# Patient Record
Sex: Female | Born: 1945
Health system: Southern US, Community
[De-identification: ages and names within clinical notes are randomized; demographics above are authoritative.]

## PROBLEM LIST (undated history)

## (undated) DIAGNOSIS — R002 Palpitations: Secondary | ICD-10-CM

## (undated) DIAGNOSIS — M1990 Unspecified osteoarthritis, unspecified site: Secondary | ICD-10-CM

## (undated) DIAGNOSIS — D649 Anemia, unspecified: Secondary | ICD-10-CM

## (undated) DIAGNOSIS — I82409 Acute embolism and thrombosis of unspecified deep veins of unspecified lower extremity: Secondary | ICD-10-CM

## (undated) DIAGNOSIS — I5181 Takotsubo syndrome: Secondary | ICD-10-CM

## (undated) DIAGNOSIS — Z87442 Personal history of urinary calculi: Secondary | ICD-10-CM

## (undated) DIAGNOSIS — I639 Cerebral infarction, unspecified: Secondary | ICD-10-CM

## (undated) DIAGNOSIS — T4145XA Adverse effect of unspecified anesthetic, initial encounter: Secondary | ICD-10-CM

## (undated) DIAGNOSIS — R570 Cardiogenic shock: Secondary | ICD-10-CM

## (undated) DIAGNOSIS — N182 Chronic kidney disease, stage 2 (mild): Secondary | ICD-10-CM

## (undated) DIAGNOSIS — I10 Essential (primary) hypertension: Secondary | ICD-10-CM

## (undated) DIAGNOSIS — M797 Fibromyalgia: Secondary | ICD-10-CM

## (undated) DIAGNOSIS — C50919 Malignant neoplasm of unspecified site of unspecified female breast: Secondary | ICD-10-CM

## (undated) DIAGNOSIS — I2129 ST elevation (STEMI) myocardial infarction involving other sites: Secondary | ICD-10-CM

## (undated) DIAGNOSIS — R55 Syncope and collapse: Secondary | ICD-10-CM

## (undated) DIAGNOSIS — T8859XA Other complications of anesthesia, initial encounter: Secondary | ICD-10-CM

## (undated) DIAGNOSIS — E119 Type 2 diabetes mellitus without complications: Secondary | ICD-10-CM

## (undated) DIAGNOSIS — E785 Hyperlipidemia, unspecified: Secondary | ICD-10-CM

## (undated) DIAGNOSIS — M0520 Rheumatoid vasculitis with rheumatoid arthritis of unspecified site: Secondary | ICD-10-CM

## (undated) DIAGNOSIS — K7581 Nonalcoholic steatohepatitis (NASH): Secondary | ICD-10-CM

## (undated) HISTORY — DX: Rheumatoid vasculitis with rheumatoid arthritis of unspecified site: M05.20

## (undated) HISTORY — PX: CARDIAC CATHETERIZATION: SHX172

## (undated) HISTORY — PX: COLONOSCOPY W/ BIOPSIES: SHX1374

## (undated) HISTORY — PX: CHOLECYSTECTOMY: SHX55

## (undated) HISTORY — PX: PLANTAR'S WART EXCISION: SHX2240

## (undated) HISTORY — PX: TRANSTHORACIC ECHOCARDIOGRAM: SHX275

## (undated) HISTORY — PX: BACK SURGERY: SHX140

## (undated) HISTORY — DX: Malignant neoplasm of unspecified site of unspecified female breast: C50.919

## (undated) HISTORY — PX: ABDOMINAL HYSTERECTOMY: SHX81

## (undated) HISTORY — PX: LARYNGOSCOPY: SUR817

---
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
## 1977-03-04 HISTORY — PX: TUBAL LIGATION: SHX77

## 1991-03-05 DIAGNOSIS — I82409 Acute embolism and thrombosis of unspecified deep veins of unspecified lower extremity: Secondary | ICD-10-CM

## 1991-03-05 HISTORY — DX: Acute embolism and thrombosis of unspecified deep veins of unspecified lower extremity: I82.409

## 1998-10-19 ENCOUNTER — Encounter: Payer: Self-pay | Admitting: Cardiology

## 1998-10-19 ENCOUNTER — Ambulatory Visit (HOSPITAL_COMMUNITY): Admission: AD | Admit: 1998-10-19 | Discharge: 1998-10-19 | Payer: Self-pay | Admitting: Cardiology

## 1999-07-02 ENCOUNTER — Other Ambulatory Visit: Admission: RE | Admit: 1999-07-02 | Discharge: 1999-07-02 | Payer: Self-pay | Admitting: Gynecology

## 2000-12-18 ENCOUNTER — Encounter (INDEPENDENT_AMBULATORY_CARE_PROVIDER_SITE_OTHER): Payer: Self-pay

## 2000-12-18 ENCOUNTER — Ambulatory Visit (HOSPITAL_COMMUNITY): Admission: RE | Admit: 2000-12-18 | Discharge: 2000-12-18 | Payer: Self-pay | Admitting: Gynecology

## 2001-08-18 ENCOUNTER — Emergency Department (HOSPITAL_COMMUNITY): Admission: EM | Admit: 2001-08-18 | Discharge: 2001-08-18 | Payer: Self-pay | Admitting: *Deleted

## 2001-08-18 ENCOUNTER — Encounter: Payer: Self-pay | Admitting: *Deleted

## 2002-10-18 ENCOUNTER — Other Ambulatory Visit: Admission: RE | Admit: 2002-10-18 | Discharge: 2002-10-18 | Payer: Self-pay | Admitting: Gynecology

## 2008-09-12 ENCOUNTER — Emergency Department (HOSPITAL_COMMUNITY): Admission: EM | Admit: 2008-09-12 | Discharge: 2008-09-13 | Payer: Self-pay | Admitting: Emergency Medicine

## 2009-03-04 DIAGNOSIS — J3800 Paralysis of vocal cords and larynx, unspecified: Secondary | ICD-10-CM

## 2009-03-04 HISTORY — DX: Paralysis of vocal cords and larynx, unspecified: J38.00

## 2009-04-28 ENCOUNTER — Encounter: Admission: RE | Admit: 2009-04-28 | Discharge: 2009-04-28 | Payer: Self-pay | Admitting: Sports Medicine

## 2009-08-07 HISTORY — PX: PARATHYROIDECTOMY: SHX19

## 2010-03-24 ENCOUNTER — Encounter: Payer: Self-pay | Admitting: Sports Medicine

## 2010-03-25 ENCOUNTER — Encounter: Payer: Self-pay | Admitting: Sports Medicine

## 2010-06-10 LAB — CBC
HCT: 39.1 % (ref 36.0–46.0)
Hemoglobin: 13.8 g/dL (ref 12.0–15.0)
MCV: 96.4 fL (ref 78.0–100.0)
RDW: 13.4 % (ref 11.5–15.5)

## 2010-06-10 LAB — DIFFERENTIAL
Basophils Absolute: 0.1 10*3/uL (ref 0.0–0.1)
Eosinophils Absolute: 0.4 10*3/uL (ref 0.0–0.7)
Eosinophils Relative: 4 % (ref 0–5)
Lymphocytes Relative: 37 % (ref 12–46)
Lymphs Abs: 3.6 10*3/uL (ref 0.7–4.0)
Monocytes Absolute: 0.8 10*3/uL (ref 0.1–1.0)

## 2010-06-10 LAB — POCT I-STAT, CHEM 8
BUN: 19 mg/dL (ref 6–23)
Calcium, Ion: 1.19 mmol/L (ref 1.12–1.32)
Creatinine, Ser: 0.7 mg/dL (ref 0.4–1.2)
TCO2: 19 mmol/L (ref 0–100)

## 2010-06-10 LAB — URINALYSIS, ROUTINE W REFLEX MICROSCOPIC
Bilirubin Urine: NEGATIVE
Hgb urine dipstick: NEGATIVE
Ketones, ur: NEGATIVE mg/dL
Specific Gravity, Urine: 1.012 (ref 1.005–1.030)
Urobilinogen, UA: 0.2 mg/dL (ref 0.0–1.0)

## 2010-06-10 LAB — URINE MICROSCOPIC-ADD ON

## 2010-06-10 LAB — GLUCOSE, CAPILLARY: Glucose-Capillary: 221 mg/dL — ABNORMAL HIGH (ref 70–99)

## 2010-07-20 NOTE — Op Note (Signed)
[facility]  Patient:    Sarah Flores, Sarah Flores Visit Number: [id number] MRN: [id number]          Service Type: DSU Location: [facility] Attending Physician:  Susa Raring Dictated by:   Luvenia Redden, M.D. Proc. Date: 12/18/00 Admit Date:  12/18/2000                             Operative Report  PREOPERATIVE DIAGNOSES:       1. Pigmented lesion of the right vulva unknown                                  etiology.                               2. 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  POSTOPERATIVE DIAGNOSES:      1. Pigmented lesion of the right vulva unknown                                  etiology.                               2. 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  OPERATION:                    Excisional biopsies.  PROCEDURE:                    Patient was on the operating table in the modified lithotomy position under sedation.  The questionable areas were infiltrated with 1% lidocaine for anesthesia.  Each lesion was then excised and the bed cauterized with electrocoagulation.  The lesions were sent to pathology for examination.  There were two other small hemangiomas on the vulva that were electrodesiccated.  Neosporin ointment was applied to the operative sites.  Blood loss was nil.  Patient was removed to recovery in good condition. Dictated by:   Luvenia Redden, M.D. Attending Physician:  Susa Raring DD:  12/18/00 TD:  12/19/00 Job: [id number]

## 2013-01-19 ENCOUNTER — Other Ambulatory Visit: Payer: Self-pay | Admitting: Neurological Surgery

## 2013-02-05 ENCOUNTER — Encounter (HOSPITAL_COMMUNITY): Payer: Self-pay | Admitting: Pharmacy Technician

## 2013-02-09 NOTE — Pre-Procedure Instructions (Signed)
ALESA ECHEVARRIA  02/09/2013   Your procedure is scheduled on:  Thurs, Dec 18 @ 2:00 PM  Report to [facility] Short Stay Entrance A  at 12:00 PM.  Call this number if you have problems the morning of surgery: [phone]   Remember:   Do not eat food or drink liquids after midnight.   Take these medicines the morning of surgery with A SIP OF WATER: Cymbalta(Duloxetine),Pain Pill(if needed),and Metoprolol(Lopressor)              No BC's,Goody's,Aleve,Aspirin,Ibuprofen,Fish Oil,or any Herbal Medications    Do not wear jewelry, make-up or nail polish.  Do not wear lotions, powders, or perfumes. You may wear deodorant.  Do not shave 48 hours prior to surgery.   Do not bring valuables to the hospital.  [facility] is not responsible                  for any belongings or valuables.               Contacts, dentures or bridgework may not be worn into surgery.  Leave suitcase in the car. After surgery it may be brought to your room.  For patients admitted to the hospital, discharge time is determined by your                treatment team.               Special Instructions: Shower using CHG 2 nights before surgery and the night before surgery.  If you shower the day of surgery use CHG.  Use special wash - you have one bottle of CHG for all showers.  You should use approximately 1/3 of the bottle for each shower.   Please read over the following fact sheets that you were given: Pain Booklet, Coughing and Deep Breathing, Blood Transfusion Information, MRSA Information and Surgical Site Infection Prevention

## 2013-02-10 ENCOUNTER — Ambulatory Visit (HOSPITAL_COMMUNITY)
Admission: RE | Admit: 2013-02-10 | Discharge: 2013-02-10 | Disposition: A | Discharge status: Home / Self Care | Payer: Medicare Other | Source: Ambulatory Visit | Attending: Neurological Surgery | Admitting: Neurological Surgery

## 2013-02-10 ENCOUNTER — Encounter (HOSPITAL_COMMUNITY): Payer: Self-pay

## 2013-02-10 ENCOUNTER — Encounter (HOSPITAL_COMMUNITY)
Admission: RE | Admit: 2013-02-10 | Discharge: 2013-02-10 | Disposition: A | Discharge status: Home / Self Care | Payer: Medicare Other | Source: Ambulatory Visit | Attending: Neurological Surgery | Admitting: Neurological Surgery

## 2013-02-10 DIAGNOSIS — Z01818 Encounter for other preprocedural examination: Secondary | ICD-10-CM | POA: Insufficient documentation

## 2013-02-10 DIAGNOSIS — Z01812 Encounter for preprocedural laboratory examination: Secondary | ICD-10-CM | POA: Insufficient documentation

## 2013-02-10 DIAGNOSIS — Z01810 Encounter for preprocedural cardiovascular examination: Secondary | ICD-10-CM | POA: Insufficient documentation

## 2013-02-10 DIAGNOSIS — B958 Unspecified staphylococcus as the cause of diseases classified elsewhere: Secondary | ICD-10-CM | POA: Insufficient documentation

## 2013-02-10 HISTORY — DX: Fibromyalgia: M79.7

## 2013-02-10 HISTORY — DX: Essential (primary) hypertension: I10

## 2013-02-10 HISTORY — DX: Adverse effect of unspecified anesthetic, initial encounter: T41.45XA

## 2013-02-10 HISTORY — DX: Other complications of anesthesia, initial encounter: T88.59XA

## 2013-02-10 HISTORY — DX: Unspecified osteoarthritis, unspecified site: M19.90

## 2013-02-10 LAB — BASIC METABOLIC PANEL
BUN: 9 mg/dL (ref 6–23)
Calcium: 8.4 mg/dL (ref 8.4–10.5)
Chloride: 95 mEq/L — ABNORMAL LOW (ref 96–112)
Creatinine, Ser: 0.87 mg/dL (ref 0.50–1.10)
GFR calc Af Amer: 79 mL/min — ABNORMAL LOW (ref >90)
GFR calc non Af Amer: 68 mL/min — ABNORMAL LOW (ref >90)
Glucose, Bld: 180 mg/dL — ABNORMAL HIGH (ref 70–99)

## 2013-02-10 LAB — CBC WITH DIFFERENTIAL/PLATELET
Basophils Absolute: 0.1 10*3/uL (ref 0.0–0.1)
Basophils Relative: 1 % (ref 0–1)
Lymphocytes Relative: 31 % (ref 12–46)
MCHC: 34.8 g/dL (ref 30.0–36.0)
Neutro Abs: 6.6 10*3/uL (ref 1.7–7.7)
Platelets: 397 10*3/uL (ref 150–400)
RDW: 13.2 % (ref 11.5–15.5)
WBC: 11.1 10*3/uL — ABNORMAL HIGH (ref 4.0–10.5)

## 2013-02-10 LAB — PROTIME-INR
INR: 1.01 (ref 0.00–1.49)
Prothrombin Time: 13.1 seconds (ref 11.6–15.2)

## 2013-02-10 LAB — TYPE AND SCREEN
ABO/RH(D): O NEG
Antibody Screen: NEGATIVE

## 2013-02-10 NOTE — Progress Notes (Signed)
Erie Noe at Dr Barnett Applebaum office made aware that patient's nasal swab was positive for staph.

## 2013-02-11 ENCOUNTER — Encounter (HOSPITAL_COMMUNITY): Payer: Self-pay

## 2013-02-11 NOTE — Progress Notes (Signed)
Anesthesia Chart Review:  Patient is a 67 year old female scheduled for L5-S1 MAS, PLIF on 02/18/13 by Dr. Yetta Barre.  History includes non-smoker, HTN, DM2, CKD ? (elevated labs in the past), fibromyalgia, arthritis, hysterectomy, cholecystectomy, tonsillectomy, appendectomy, hyperparathyroidism s/p parathyroidectomy '11. PCP is listed as Dr. Feliciana Rossetti.  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  Anesthesia records obtained from that date.  Notes indicate that she was intubated with a 7.0 ETT under direct vision with atraumatic laryngoscopy (laryngoscope blade MAC 3) X 1 with grade 1 view.  However, on 09/29/09, she did undergo laryngoscopy micro suspension with left Cymetra injection for dysphonia.  EKG on 02/10/13 showed NSR.  Preoperative CXR and labs noted.    Anticipate plans for GA with ETT for this procedure.  Further evaluation for specific airway equipment needs by her assigned anesthesiologist on the day of surgery.  Sarah Flores [facility] Short Stay Center/Anesthesiology Phone [phone] 02/11/2013 11:19 AM

## 2013-02-18 ENCOUNTER — Inpatient Hospital Stay (HOSPITAL_COMMUNITY): Payer: Medicare Other | Admitting: Anesthesiology

## 2013-02-18 ENCOUNTER — Inpatient Hospital Stay (HOSPITAL_COMMUNITY): Payer: Medicare Other

## 2013-02-18 ENCOUNTER — Inpatient Hospital Stay (HOSPITAL_COMMUNITY)
Admission: RE | Admit: 2013-02-18 | Discharge: 2013-02-28 | DRG: 459 | Disposition: A | Discharge status: Home / Self Care | Payer: Medicare Other | Source: Ambulatory Visit | Attending: Neurological Surgery | Admitting: Neurological Surgery

## 2013-02-18 ENCOUNTER — Encounter (HOSPITAL_COMMUNITY): Payer: Self-pay | Admitting: *Deleted

## 2013-02-18 ENCOUNTER — Encounter (HOSPITAL_COMMUNITY): Payer: Medicare Other | Admitting: Vascular Surgery

## 2013-02-18 ENCOUNTER — Encounter (HOSPITAL_COMMUNITY)
Admission: RE | Disposition: A | Discharge status: Home / Self Care | Payer: Self-pay | Source: Ambulatory Visit | Attending: Neurological Surgery

## 2013-02-18 DIAGNOSIS — E876 Hypokalemia: Secondary | ICD-10-CM | POA: Diagnosis not present

## 2013-02-18 DIAGNOSIS — M129 Arthropathy, unspecified: Secondary | ICD-10-CM | POA: Diagnosis present

## 2013-02-18 DIAGNOSIS — E1165 Type 2 diabetes mellitus with hyperglycemia: Secondary | ICD-10-CM | POA: Diagnosis present

## 2013-02-18 DIAGNOSIS — I129 Hypertensive chronic kidney disease with stage 1 through stage 4 chronic kidney disease, or unspecified chronic kidney disease: Secondary | ICD-10-CM | POA: Diagnosis present

## 2013-02-18 DIAGNOSIS — IMO0002 Reserved for concepts with insufficient information to code with codable children: Secondary | ICD-10-CM | POA: Diagnosis present

## 2013-02-18 DIAGNOSIS — I10 Essential (primary) hypertension: Secondary | ICD-10-CM | POA: Diagnosis present

## 2013-02-18 DIAGNOSIS — Z981 Arthrodesis status: Secondary | ICD-10-CM

## 2013-02-18 DIAGNOSIS — R824 Acetonuria: Secondary | ICD-10-CM | POA: Diagnosis not present

## 2013-02-18 DIAGNOSIS — I5181 Takotsubo syndrome: Secondary | ICD-10-CM | POA: Diagnosis not present

## 2013-02-18 DIAGNOSIS — E119 Type 2 diabetes mellitus without complications: Secondary | ICD-10-CM

## 2013-02-18 DIAGNOSIS — R81 Glycosuria: Secondary | ICD-10-CM | POA: Diagnosis not present

## 2013-02-18 DIAGNOSIS — Z8249 Family history of ischemic heart disease and other diseases of the circulatory system: Secondary | ICD-10-CM

## 2013-02-18 DIAGNOSIS — I2129 ST elevation (STEMI) myocardial infarction involving other sites: Secondary | ICD-10-CM | POA: Diagnosis not present

## 2013-02-18 DIAGNOSIS — I952 Hypotension due to drugs: Secondary | ICD-10-CM | POA: Diagnosis not present

## 2013-02-18 DIAGNOSIS — R570 Cardiogenic shock: Secondary | ICD-10-CM | POA: Diagnosis not present

## 2013-02-18 DIAGNOSIS — M5126 Other intervertebral disc displacement, lumbar region: Principal | ICD-10-CM | POA: Diagnosis present

## 2013-02-18 DIAGNOSIS — I429 Cardiomyopathy, unspecified: Secondary | ICD-10-CM | POA: Diagnosis not present

## 2013-02-18 DIAGNOSIS — IMO0001 Reserved for inherently not codable concepts without codable children: Secondary | ICD-10-CM | POA: Diagnosis present

## 2013-02-18 DIAGNOSIS — N189 Chronic kidney disease, unspecified: Secondary | ICD-10-CM | POA: Diagnosis present

## 2013-02-18 DIAGNOSIS — E785 Hyperlipidemia, unspecified: Secondary | ICD-10-CM | POA: Diagnosis present

## 2013-02-18 HISTORY — DX: Acute embolism and thrombosis of unspecified deep veins of unspecified lower extremity: I82.409

## 2013-02-18 HISTORY — DX: Hyperlipidemia, unspecified: E78.5

## 2013-02-18 HISTORY — DX: ST elevation (STEMI) myocardial infarction involving other sites: I21.29

## 2013-02-18 HISTORY — PX: TRANSTHORACIC ECHOCARDIOGRAM: SHX275

## 2013-02-18 HISTORY — PX: MAXIMUM ACCESS (MAS)POSTERIOR LUMBAR INTERBODY FUSION (PLIF) 1 LEVEL: SHX6368

## 2013-02-18 LAB — GLUCOSE, CAPILLARY: Glucose-Capillary: 112 mg/dL — ABNORMAL HIGH (ref 70–99)

## 2013-02-18 SURGERY — FOR MAXIMUM ACCESS (MAS) POSTERIOR LUMBAR INTERBODY FUSION (PLIF) 1 LEVEL
Anesthesia: General | Site: Back

## 2013-02-18 MED ORDER — OXYCODONE-ACETAMINOPHEN 5-325 MG PO TABS
1.0000 | ORAL_TABLET | ORAL | Status: DC | PRN
  Administered 2013-02-18 – 2013-02-19 (×2): 2 via ORAL
  Administered 2013-02-20: 1 via ORAL
  Administered 2013-02-22 – 2013-02-23 (×2): 2 via ORAL
  Administered 2013-02-25: 1 via ORAL
  Administered 2013-02-25 (×2): 2 via ORAL
  Administered 2013-02-26: 1 via ORAL
  Administered 2013-02-27: 2 via ORAL
  Filled 2013-02-18 (×2): qty 2
  Filled 2013-02-18: qty 1
  Filled 2013-02-18 (×2): qty 2
  Filled 2013-02-18: qty 1
  Filled 2013-02-18: qty 2
  Filled 2013-02-18: qty 1
  Filled 2013-02-18 (×2): qty 2
  Filled 2013-02-18: qty 1
  Filled 2013-02-18 (×2): qty 2

## 2013-02-18 MED ORDER — SODIUM CHLORIDE 0.9 % IJ SOLN: 3.0000 mL | INTRAMUSCULAR | Status: DC | PRN

## 2013-02-18 MED ORDER — MORPHINE SULFATE 2 MG/ML IJ SOLN
1.0000 mg | INTRAMUSCULAR | Status: DC | PRN
  Administered 2013-02-18 – 2013-02-22 (×5): 2 mg via INTRAVENOUS
  Filled 2013-02-18 (×5): qty 1

## 2013-02-18 MED ORDER — HYDROMORPHONE HCL PF 1 MG/ML IJ SOLN
0.2500 mg | INTRAMUSCULAR | Status: DC | PRN
  Administered 2013-02-18 (×2): 0.5 mg via INTRAVENOUS

## 2013-02-18 MED ORDER — LINAGLIPTIN 5 MG PO TABS
5.0000 mg | ORAL_TABLET | Freq: Every day | ORAL | Status: DC
  Administered 2013-02-18 – 2013-02-28 (×10): 5 mg via ORAL
  Filled 2013-02-18 (×11): qty 1

## 2013-02-18 MED ORDER — TRIAMTERENE-HCTZ 37.5-25 MG PO TABS
1.0000 | ORAL_TABLET | Freq: Every day | ORAL | Status: DC
  Administered 2013-02-18 – 2013-02-19 (×2): 1 via ORAL
  Filled 2013-02-18 (×2): qty 1

## 2013-02-18 MED ORDER — LIDOCAINE HCL (CARDIAC) 20 MG/ML IV SOLN
INTRAVENOUS | Status: DC | PRN
  Administered 2013-02-18: 80 mg via INTRAVENOUS

## 2013-02-18 MED ORDER — DEXAMETHASONE SODIUM PHOSPHATE 10 MG/ML IJ SOLN
INTRAMUSCULAR | Status: AC
  Filled 2013-02-18: qty 1

## 2013-02-18 MED ORDER — SODIUM CHLORIDE 0.9 % IV SOLN: 250.0000 mL | INTRAVENOUS | Status: DC

## 2013-02-18 MED ORDER — THROMBIN 5000 UNITS EX SOLR
OROMUCOSAL | Status: DC | PRN
  Administered 2013-02-18: 14:00:00 via TOPICAL

## 2013-02-18 MED ORDER — VANCOMYCIN HCL 1000 MG IV SOLR
1000.0000 mg | Freq: Two times a day (BID) | INTRAVENOUS | Status: DC
  Administered 2013-02-18: 1000 mg via INTRAVENOUS

## 2013-02-18 MED ORDER — MENTHOL 3 MG MT LOZG: 1.0000 | LOZENGE | OROMUCOSAL | Status: DC | PRN

## 2013-02-18 MED ORDER — MIDAZOLAM HCL 5 MG/5ML IJ SOLN
INTRAMUSCULAR | Status: DC | PRN
  Administered 2013-02-18 (×2): 1 mg via INTRAVENOUS

## 2013-02-18 MED ORDER — SODIUM CHLORIDE 0.9 % IR SOLN
Status: DC | PRN
  Administered 2013-02-18: 14:00:00

## 2013-02-18 MED ORDER — ACETAMINOPHEN 325 MG PO TABS: 650.0000 mg | ORAL_TABLET | ORAL | Status: DC | PRN

## 2013-02-18 MED ORDER — CELECOXIB 200 MG PO CAPS
200.0000 mg | ORAL_CAPSULE | Freq: Two times a day (BID) | ORAL | Status: DC
  Administered 2013-02-19: 200 mg via ORAL
  Filled 2013-02-18 (×7): qty 1

## 2013-02-18 MED ORDER — 0.9 % SODIUM CHLORIDE (POUR BTL) OPTIME
TOPICAL | Status: DC | PRN
  Administered 2013-02-18: 1000 mL

## 2013-02-18 MED ORDER — PROPOFOL 10 MG/ML IV BOLUS
INTRAVENOUS | Status: DC | PRN
  Administered 2013-02-18: 160 mg via INTRAVENOUS

## 2013-02-18 MED ORDER — LOSARTAN POTASSIUM 50 MG PO TABS
100.0000 mg | ORAL_TABLET | Freq: Every day | ORAL | Status: DC
  Administered 2013-02-19: 100 mg via ORAL
  Filled 2013-02-18 (×2): qty 2

## 2013-02-18 MED ORDER — DEXAMETHASONE SODIUM PHOSPHATE 10 MG/ML IJ SOLN: 10.0000 mg | INTRAMUSCULAR | Status: DC

## 2013-02-18 MED ORDER — DEXAMETHASONE 4 MG PO TABS
4.0000 mg | ORAL_TABLET | Freq: Four times a day (QID) | ORAL | Status: DC
  Administered 2013-02-18: 4 mg via ORAL
  Filled 2013-02-18 (×7): qty 1

## 2013-02-18 MED ORDER — ACETAMINOPHEN 650 MG RE SUPP: 650.0000 mg | RECTAL | Status: DC | PRN

## 2013-02-18 MED ORDER — ALBUMIN HUMAN 5 % IV SOLN
INTRAVENOUS | Status: DC | PRN
  Administered 2013-02-18: 15:00:00 via INTRAVENOUS

## 2013-02-18 MED ORDER — GLIPIZIDE ER 10 MG PO TB24
10.0000 mg | ORAL_TABLET | Freq: Two times a day (BID) | ORAL | Status: DC
  Administered 2013-02-19 – 2013-02-28 (×18): 10 mg via ORAL
  Filled 2013-02-18 (×21): qty 1

## 2013-02-18 MED ORDER — DEXAMETHASONE SODIUM PHOSPHATE 4 MG/ML IJ SOLN
4.0000 mg | Freq: Four times a day (QID) | INTRAMUSCULAR | Status: DC
  Administered 2013-02-19 (×2): 4 mg via INTRAVENOUS
  Filled 2013-02-18 (×5): qty 1

## 2013-02-18 MED ORDER — POTASSIUM CHLORIDE IN NACL 20-0.9 MEQ/L-% IV SOLN
INTRAVENOUS | Status: DC
  Administered 2013-02-18: 19:00:00 via INTRAVENOUS
  Filled 2013-02-18 (×3): qty 1000

## 2013-02-18 MED ORDER — PHENOL 1.4 % MT LIQD: 1.0000 | OROMUCOSAL | Status: DC | PRN

## 2013-02-18 MED ORDER — SUCCINYLCHOLINE CHLORIDE 20 MG/ML IJ SOLN
INTRAMUSCULAR | Status: DC | PRN
  Administered 2013-02-18: 100 mg via INTRAVENOUS

## 2013-02-18 MED ORDER — LACTATED RINGERS IV SOLN
INTRAVENOUS | Status: DC | PRN
  Administered 2013-02-18 (×2): via INTRAVENOUS

## 2013-02-18 MED ORDER — THROMBIN 20000 UNITS EX SOLR
CUTANEOUS | Status: DC | PRN
  Administered 2013-02-18: 14:00:00 via TOPICAL

## 2013-02-18 MED ORDER — METOPROLOL TARTRATE 50 MG PO TABS
50.0000 mg | ORAL_TABLET | Freq: Every day | ORAL | Status: DC
  Administered 2013-02-18 – 2013-02-19 (×2): 50 mg via ORAL
  Filled 2013-02-18 (×2): qty 1

## 2013-02-18 MED ORDER — VANCOMYCIN HCL IN DEXTROSE 1-5 GM/200ML-% IV SOLN
1000.0000 mg | Freq: Once | INTRAVENOUS | Status: AC
  Administered 2013-02-19: 1000 mg via INTRAVENOUS
  Filled 2013-02-18: qty 200

## 2013-02-18 MED ORDER — BUPIVACAINE HCL (PF) 0.25 % IJ SOLN
INTRAMUSCULAR | Status: DC | PRN
  Administered 2013-02-18: 3 mL

## 2013-02-18 MED ORDER — DULOXETINE HCL 60 MG PO CPEP
60.0000 mg | ORAL_CAPSULE | Freq: Every day | ORAL | Status: DC
  Administered 2013-02-19 – 2013-02-28 (×10): 60 mg via ORAL
  Filled 2013-02-18 (×11): qty 1

## 2013-02-18 MED ORDER — ONDANSETRON HCL 4 MG/2ML IJ SOLN
INTRAMUSCULAR | Status: DC | PRN
  Administered 2013-02-18: 4 mg via INTRAVENOUS

## 2013-02-18 MED ORDER — GLIPIZIDE ER 10 MG PO TB24
10.0000 mg | ORAL_TABLET | Freq: Two times a day (BID) | ORAL | Status: DC
  Filled 2013-02-18: qty 1

## 2013-02-18 MED ORDER — ONDANSETRON HCL 4 MG/2ML IJ SOLN
4.0000 mg | INTRAMUSCULAR | Status: DC | PRN
  Administered 2013-02-19 – 2013-02-23 (×3): 4 mg via INTRAVENOUS
  Filled 2013-02-18 (×3): qty 2

## 2013-02-18 MED ORDER — HYDROMORPHONE HCL PF 1 MG/ML IJ SOLN
INTRAMUSCULAR | Status: AC
  Filled 2013-02-18: qty 1

## 2013-02-18 MED ORDER — OXYCODONE HCL 5 MG/5ML PO SOLN: 5.0000 mg | Freq: Once | ORAL | Status: DC | PRN

## 2013-02-18 MED ORDER — EPHEDRINE SULFATE 50 MG/ML IJ SOLN
INTRAMUSCULAR | Status: DC | PRN
  Administered 2013-02-18: 5 mg via INTRAVENOUS
  Administered 2013-02-18: 10 mg via INTRAVENOUS
  Administered 2013-02-18 (×2): 5 mg via INTRAVENOUS

## 2013-02-18 MED ORDER — METFORMIN HCL 500 MG PO TABS
1000.0000 mg | ORAL_TABLET | Freq: Two times a day (BID) | ORAL | Status: DC
  Administered 2013-02-19: 1000 mg via ORAL
  Filled 2013-02-18 (×3): qty 2

## 2013-02-18 MED ORDER — DEXAMETHASONE SODIUM PHOSPHATE 4 MG/ML IJ SOLN
INTRAMUSCULAR | Status: DC | PRN
  Administered 2013-02-18: 4 mg via INTRAVENOUS

## 2013-02-18 MED ORDER — METHOCARBAMOL 100 MG/ML IJ SOLN
500.0000 mg | Freq: Four times a day (QID) | INTRAVENOUS | Status: DC | PRN
  Administered 2013-02-19: 500 mg via INTRAVENOUS
  Filled 2013-02-18: qty 5

## 2013-02-18 MED ORDER — SODIUM CHLORIDE 0.9 % IJ SOLN
3.0000 mL | Freq: Two times a day (BID) | INTRAMUSCULAR | Status: DC
  Administered 2013-02-18 – 2013-02-19 (×2): 3 mL via INTRAVENOUS

## 2013-02-18 MED ORDER — ARTIFICIAL TEARS OP OINT
TOPICAL_OINTMENT | OPHTHALMIC | Status: DC | PRN
  Administered 2013-02-18: 1 via OPHTHALMIC

## 2013-02-18 MED ORDER — METHOCARBAMOL 500 MG PO TABS
500.0000 mg | ORAL_TABLET | Freq: Four times a day (QID) | ORAL | Status: DC | PRN
  Administered 2013-02-18 – 2013-02-21 (×7): 500 mg via ORAL
  Filled 2013-02-18 (×7): qty 1

## 2013-02-18 MED ORDER — VANCOMYCIN HCL IN DEXTROSE 1-5 GM/200ML-% IV SOLN
INTRAVENOUS | Status: AC
  Filled 2013-02-18: qty 200

## 2013-02-18 MED ORDER — FENTANYL CITRATE 0.05 MG/ML IJ SOLN
INTRAMUSCULAR | Status: DC | PRN
  Administered 2013-02-18 (×3): 50 ug via INTRAVENOUS

## 2013-02-18 MED ORDER — LACTATED RINGERS IV SOLN
INTRAVENOUS | Status: DC
  Administered 2013-02-18: 12:00:00 via INTRAVENOUS

## 2013-02-18 MED ORDER — ONDANSETRON HCL 4 MG/2ML IJ SOLN: 4.0000 mg | Freq: Four times a day (QID) | INTRAMUSCULAR | Status: DC | PRN

## 2013-02-18 MED ORDER — OXYCODONE HCL 5 MG PO TABS: 5.0000 mg | ORAL_TABLET | Freq: Once | ORAL | Status: DC | PRN

## 2013-02-18 SURGICAL SUPPLY — 61 items
APL SKNCLS STERI-STRIP NONHPOA (GAUZE/BANDAGES/DRESSINGS) ×1
BAG DECANTER FOR FLEXI CONT (MISCELLANEOUS) ×2 IMPLANT
BENZOIN TINCTURE PRP APPL 2/3 (GAUZE/BANDAGES/DRESSINGS) ×2 IMPLANT
BLADE SURG ROTATE 9660 (MISCELLANEOUS) IMPLANT
BONE MATRIX OSTEOCEL PRO MED (Bone Implant) ×2 IMPLANT
BUR MATCHSTICK NEURO 3.0 LAGG (BURR) ×2 IMPLANT
CAGE MAS PLIF 9X9X23-8 LUMBAR (Cage) ×4 IMPLANT
CANISTER SUCT 3000ML (MISCELLANEOUS) ×2 IMPLANT
CLIP NEUROVISION LG (CLIP) ×2 IMPLANT
CONT SPEC 4OZ CLIKSEAL STRL BL (MISCELLANEOUS) ×4 IMPLANT
COVER BACK TABLE 24X17X13 BIG (DRAPES) IMPLANT
COVER TABLE BACK 60X90 (DRAPES) ×2 IMPLANT
DRAPE C-ARM 42X72 X-RAY (DRAPES) ×2 IMPLANT
DRAPE C-ARMOR (DRAPES) ×2 IMPLANT
DRAPE LAPAROTOMY 100X72X124 (DRAPES) ×2 IMPLANT
DRAPE POUCH INSTRU U-SHP 10X18 (DRAPES) ×2 IMPLANT
DRAPE SURG 17X23 STRL (DRAPES) ×2 IMPLANT
DRESSING TELFA 8X3 (GAUZE/BANDAGES/DRESSINGS) ×2 IMPLANT
DRSG OPSITE 4X5.5 SM (GAUZE/BANDAGES/DRESSINGS) ×2 IMPLANT
DRSG OPSITE POSTOP 4X6 (GAUZE/BANDAGES/DRESSINGS) ×2 IMPLANT
DURAPREP 26ML APPLICATOR (WOUND CARE) ×2 IMPLANT
ELECT REM PT RETURN 9FT ADLT (ELECTROSURGICAL) ×2
ELECTRODE REM PT RTRN 9FT ADLT (ELECTROSURGICAL) ×1 IMPLANT
EVACUATOR 1/8 PVC DRAIN (DRAIN) ×2 IMPLANT
GAUZE SPONGE 4X4 16PLY XRAY LF (GAUZE/BANDAGES/DRESSINGS) IMPLANT
GLOVE BIO SURGEON STRL SZ8 (GLOVE) ×4 IMPLANT
GLOVE BIOGEL PI IND STRL 7.5 (GLOVE) ×1 IMPLANT
GLOVE BIOGEL PI INDICATOR 7.5 (GLOVE) ×1
GLOVE ECLIPSE 7.5 STRL STRAW (GLOVE) ×1 IMPLANT
GLOVE INDICATOR 7.5 STRL GRN (GLOVE) ×2 IMPLANT
GLOVE SURG SS PI 7.0 STRL IVOR (GLOVE) ×2 IMPLANT
GOWN BRE IMP SLV AUR LG STRL (GOWN DISPOSABLE) ×2 IMPLANT
GOWN BRE IMP SLV AUR XL STRL (GOWN DISPOSABLE) ×2 IMPLANT
GOWN STRL REIN 2XL LVL4 (GOWN DISPOSABLE) IMPLANT
HEMOSTAT POWDER KIT SURGIFOAM (HEMOSTASIS) IMPLANT
KIT BASIN OR (CUSTOM PROCEDURE TRAY) ×2 IMPLANT
KIT ROOM TURNOVER OR (KITS) ×2 IMPLANT
MILL MEDIUM DISP (BLADE) ×2 IMPLANT
NEEDLE HYPO 25X1 1.5 SAFETY (NEEDLE) ×2 IMPLANT
NS IRRIG 1000ML POUR BTL (IV SOLUTION) ×2 IMPLANT
PACK LAMINECTOMY NEURO (CUSTOM PROCEDURE TRAY) ×2 IMPLANT
PAD ARMBOARD 7.5X6 YLW CONV (MISCELLANEOUS) ×6 IMPLANT
ROD 35MM (Rod) ×1 IMPLANT
SCREW 5.0X30 (Screw) ×4 IMPLANT
SCREW LOCK (Screw) ×8 IMPLANT
SCREW LOCK FXNS SPNE MAS PL (Screw) ×4 IMPLANT
SCREW SHANK 5.0X30MM (Screw) ×4 IMPLANT
SCREW TULIP 5.5 (Screw) ×4 IMPLANT
SPONGE LAP 4X18 X RAY DECT (DISPOSABLE) IMPLANT
SPONGE SURGIFOAM ABS GEL 100 (HEMOSTASIS) ×2 IMPLANT
STRIP CLOSURE SKIN 1/2X4 (GAUZE/BANDAGES/DRESSINGS) ×2 IMPLANT
SUT VIC AB 0 CT1 18XCR BRD8 (SUTURE) ×1 IMPLANT
SUT VIC AB 0 CT1 8-18 (SUTURE) ×2
SUT VIC AB 2-0 CP2 18 (SUTURE) ×2 IMPLANT
SUT VIC AB 3-0 SH 8-18 (SUTURE) ×4 IMPLANT
SYR 20ML ECCENTRIC (SYRINGE) ×2 IMPLANT
SYR 3ML LL SCALE MARK (SYRINGE) IMPLANT
TOWEL OR 17X24 6PK STRL BLUE (TOWEL DISPOSABLE) ×2 IMPLANT
TOWEL OR 17X26 10 PK STRL BLUE (TOWEL DISPOSABLE) ×2 IMPLANT
TRAY FOLEY CATH 14FRSI W/METER (CATHETERS) ×2 IMPLANT
WATER STERILE IRR 1000ML POUR (IV SOLUTION) ×2 IMPLANT

## 2013-02-18 NOTE — Transfer of Care (Signed)
Immediate Anesthesia Transfer of Care Note  Patient: Sarah Flores  Procedure(s) Performed: Procedure(s): LUMBAR FIVE TO SACRAL ONE MAXIMUM ACCESS (MAS) POSTERIOR LUMBAR INTERBODY FUSION (PLIF) 1 LEVEL (N/A)  Patient Location: PACU  Anesthesia Type:General  Level of Consciousness: awake, alert  and oriented  Airway & Oxygen Therapy: Patient Spontanous Breathing and Patient connected to nasal cannula oxygen  Post-op Assessment: Report given to PACU RN and Post -op Vital signs reviewed and stable  Post vital signs: Reviewed and stable  Complications: No apparent anesthesia complications

## 2013-02-18 NOTE — Preoperative (Signed)
Beta Blockers   Reason not to administer Beta Blockers:Not Applicable 

## 2013-02-18 NOTE — Op Note (Signed)
02/18/2013  4:11 PM  PATIENT:  Sarah Flores  67 y.o. 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 Decompressive lumbar laminectomy L5-S1 requiring more work than would be required for a simple exposure of the disk for PLIF in order to adequately decompress the neural elements and address the spinal stenosis 2. Posterior lumbar interbody fusion L5-S1 using PEEK interbody cages packed with morcellized allograft and autograft 3. Posterior fixation L5-S1 using NUvasive cortical pedicle screws.    SURGEON:  Marikay Alar, MD  ASSISTANTS: Dr. Conchita Paris  ANESTHESIA:  General  EBL: 200 ml  Total I/O In: 1750 [I.V.:1500; IV Piggyback:250] Out: 360 [Urine:160; Blood:200]  BLOOD ADMINISTERED:none  DRAINS: Hemovac   INDICATION FOR PROCEDURE: This patient presented with severe left leg pain in an L5 distribution. She had an MRI which showed degenerative disc disease with spondylosis and foraminal disc herniation at L5-S1 on the left. Recommended a decompression instrumented fusion in order to get adequate decompression of the L5 nerve root. Patient understood the risks, benefits, and alternatives and potential outcomes and wished to proceed.  PROCEDURE DETAILS:  The patient was brought to the operating room. After induction of generalized endotracheal anesthesia the patient was rolled into the prone position on chest rolls and all pressure points were padded. The patient's lumbar region was cleaned and then prepped with DuraPrep and draped in the usual sterile fashion. Anesthesia was injected and then a dorsal midline incision was made and carried down to the lumbosacral fascia. The fascia was opened and the paraspinous musculature was taken down in a subperiosteal fashion to expose L5-S1. A self-retaining retractor was placed. Intraoperative  fluoroscopy confirmed my level, and I started with placement of the L5 cortical pedicle screws. The pedicle screw entry zones were identified utilizing surface landmarks and  AP and lateral fluoroscopy. I scored the cortex with the high-speed drill and then used the hand drill and EMG monitoring to drill an upward and outward direction into the pedicle. I then tapped line to line, and the tap was also monitored. I then placed a 5-0 by 30 mm cortical pedicle screw into the pedicles of L5 bilaterally. I then turned my attention to the decompression and the spinous process was removed and complete lumbar laminectomies, hemi- facetectomies, and foraminotomies were performed at L5-S1. The patient had significant spinal stenosis and this required more work than would be required for a simple exposure of the disc for posterior lumbar interbody fusion. Much more generous decompression was undertaken in order to adequately decompress the neural elements and address the patient's leg pain. The yellow ligament was removed to expose the underlying dura and nerve roots, and generous foraminotomies were performed to adequately decompress the neural elements. Both the exiting and traversing nerve roots were decompressed on both sides until a coronary dilator passed easily along the nerve roots. There was a large extradural disc herniation at L5-S1 on the left that was removed. Once the decompression was complete, I turned my attention to the posterior lower lumbar interbody fusion. The epidural venous vasculature was coagulated and cut sharply. Disc space was incised and the initial discectomy was performed with pituitary rongeurs. The disc space was distracted with sequential distractors to a height of 11 mm. We then used a series of scrapers and shavers to prepare the endplates for fusion. The midline was prepared with Epstein curettes. Once the complete discectomy was  finished, we packed an appropriate sized peek interbody cage  with local autograft and morcellized allograft, gently retracted the nerve root, and tapped the cage into position at L5-S1.  The midline between the cages was packed with morselized autograft and allograft. We then turned our attention to the placement of the lower pedicle screws. The pedicle screw entry zones were identified utilizing surface landmarks and fluoroscopy. I drilled into each pedicle utilizing the hand drill and EMG monitoring, and tapped each pedicle with the appropriate tap. We palpated with a ball probe to assure no break in the cortex. We then placed 5-5 by 30 mm pedicle screws into the pedicles bilaterally at S1. We then placed lordotic rods into the multiaxial screw heads of the pedicle screws and locked these in position with the locking caps and anti-torque device. We then checked our construct with AP and lateral fluoroscopy. Irrigated with copious amounts of bacitracin-containing saline solution. Placed a medium Hemovac drain through separate stab incision. Inspected the nerve roots once again to assure adequate decompression, lined to the dura with Gelfoam, and closed the muscle and the fascia with 0 Vicryl. Closed the subcutaneous tissues with 2-0 Vicryl and subcuticular tissues with 3-0 Vicryl. The skin was closed with benzoin and Steri-Strips. Dressing was then applied, the patient was awakened from general anesthesia and transported to the recovery room in stable condition. At the end of the procedure all sponge, needle and instrument counts were correct.   PLAN OF CARE: Admit to inpatient   PATIENT DISPOSITION:  PACU - hemodynamically stable.   Delay start of Pharmacological VTE agent (>24hrs) due to surgical blood loss or risk of bleeding:  yes

## 2013-02-18 NOTE — H&P (Signed)
Subjective: Patient is a 67 y.o. female admitted for PLIF L5-S1. Onset of symptoms was several months ago, gradually worsening since that time.  The pain is rated severe, and is located at the across the lower back and radiates to leg. The pain is described as aching and occurs intermittently. The symptoms have been progressive. Symptoms are exacerbated by exercise. MRI or CT showed DDD with far lateral HNP L5-S1.   Past Medical History  Diagnosis Date  . 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 PONV (postoperative nausea and vomiting)   . Hypertension   . Diabetes mellitus without complication   . Chronic kidney disease     abnormal labs  . Fibromyalgia   . Arthritis     Past Surgical History  Procedure Laterality Date  . Abdominal hysterectomy    . Cholecystectomy    . Tonsillectomy    . Appendectomy    . Parathyroidectomy  08/07/09    [facility]  . Tubal ligation    . Colonoscopy w/ biopsies    . Laryngoscopy      laryngoscopy micro suspension with left Cymetra injection for dysphonia 09/29/09 [facility])    Prior to Admission medications   Medication Sig Start Date End Date Taking? Authorizing Provider  DULoxetine (CYMBALTA) 60 MG capsule Take 60 mg by mouth daily.   Yes Historical Provider, MD  glipiZIDE (GLUCOTROL XL) 10 MG 24 hr tablet Take 10 mg by mouth 2 (two) times daily.   Yes Historical Provider, MD  HYDROcodone-acetaminophen (NORCO) 7.5-325 MG per tablet Take 1 tablet by mouth every 6 (six) hours as needed (for pain).   Yes Historical Provider, MD  losartan (COZAAR) 100 MG tablet Take 100 mg by mouth daily.   Yes Historical Provider, MD  metFORMIN (GLUCOPHAGE) 1000 MG tablet Take 1,000 mg by mouth 2 (two) times daily with a meal.   Yes Historical Provider, MD  metoprolol (LOPRESSOR) 50 MG tablet Take 50 mg by mouth daily.   Yes Historical Provider, MD  sitaGLIPtin (JANUVIA) 100 MG tablet Take 100 mg by mouth daily.   Yes Historical  Provider, MD  triamterene-hydrochlorothiazide (MAXZIDE-25) 37.5-25 MG per tablet Take 1 tablet by mouth daily.   Yes Historical Provider, MD  Vitamin D, Ergocalciferol, (DRISDOL) 50000 UNITS CAPS capsule Take 50,000 Units by mouth every 7 (seven) days. Wednesdays   Yes Historical Provider, MD   Allergies  Allergen Reactions  . Penicillins Anaphylaxis    History  Substance Use Topics  . Smoking status: Never Smoker   . Smokeless tobacco: Never Used  . Alcohol Use: No    History reviewed. No pertinent family history.   Review of Systems  Positive ROS: neg  All other systems have been reviewed and were otherwise negative with the exception of those mentioned in the HPI and as above.  Objective: Vital signs in last 24 hours: Temp:  [98 F (36.7 C)] 98 F (36.7 C) (12/18 1210) Pulse Rate:  [79] 79 (12/18 1210) Resp:  [20] 20 (12/18 1210) BP: (162)/(80) 162/80 mmHg (12/18 1210) SpO2:  [98 %] 98 % (12/18 1210)  General Appearance: Alert, cooperative, no distress, appears stated age Head: Normocephalic, without obvious abnormality, atraumatic Eyes: PERRL, conjunctiva/corneas clear, EOM's intact    Neck: Supple, symmetrical, trachea midline Back: Symmetric, no curvature, ROM normal, no CVA tenderness Lungs:  respirations unlabored Heart: Regular rate and rhythm Abdomen: Soft, non-tender Extremities: Extremities normal, atraumatic, no cyanosis or edema Pulses: 2+ and  symmetric all extremities Skin: Skin color, texture, turgor normal, no rashes or lesions  NEUROLOGIC:   Mental status: Alert and oriented x4,  no aphasia, good attention span, fund of knowledge, and memory Motor Exam - grossly normal Sensory Exam - grossly normal Reflexes: 1+ Coordination - grossly normal Gait - grossly normal Balance - grossly normal Cranial Nerves: I: smell Not tested  II: visual acuity  OS: nl    OD: nl  II: visual fields Full to confrontation  II: pupils Equal, round, reactive to light   III,VII: ptosis None  III,IV,VI: extraocular muscles  Full ROM  V: mastication Normal  V: facial light touch sensation  Normal  V,VII: corneal reflex  Present  VII: facial muscle function - upper  Normal  VII: facial muscle function - lower Normal  VIII: hearing Not tested  IX: soft palate elevation  Normal  IX,X: gag reflex Present  XI: trapezius strength  5/5  XI: sternocleidomastoid strength 5/5  XI: neck flexion strength  5/5  XII: tongue strength  Normal    Data Review Lab Results  Component Value Date   WBC 11.1* 02/10/2013   HGB 14.1 02/10/2013   HCT 40.5 02/10/2013   MCV 94.6 02/10/2013   PLT 397 02/10/2013   Lab Results  Component Value Date   NA 136 02/10/2013   K 4.1 02/10/2013   CL 95* 02/10/2013   CO2 25 02/10/2013   BUN 9 02/10/2013   CREATININE 0.87 02/10/2013   GLUCOSE 180* 02/10/2013   Lab Results  Component Value Date   INR 1.01 02/10/2013    Assessment/Plan: Patient admitted for PLIF L5-S1. Patient has failed a reasonable attempt at conservative therapy.  I explained the condition and procedure to the patient and answered any questions.  Patient wishes to proceed with procedure as planned. Understands risks/ benefits and typical outcomes of procedure.   Zenna Traister S 02/18/2013 12:54 PM

## 2013-02-18 NOTE — Anesthesia Postprocedure Evaluation (Signed)
  Anesthesia Post-op Note  Patient: Sarah Flores  Procedure(s) Performed: Procedure(s): LUMBAR FIVE TO SACRAL ONE MAXIMUM ACCESS (MAS) POSTERIOR LUMBAR INTERBODY FUSION (PLIF) 1 LEVEL (N/A)  Patient Location: PACU  Anesthesia Type:General  Level of Consciousness: awake, alert  and oriented  Airway and Oxygen Therapy: Patient Spontanous Breathing and Patient connected to nasal cannula oxygen  Post-op Pain: none  Post-op Assessment: Post-op Vital signs reviewed  Post-op Vital Signs: Reviewed  Complications: No apparent anesthesia complications

## 2013-02-18 NOTE — Progress Notes (Signed)
ANTIBIOTIC CONSULT NOTE - INITIAL  Pharmacy Consult for Vancomycin Indication: Surgical prophylaxis.  Allergies  Allergen Reactions  . Penicillins Anaphylaxis    Patient Measurements: Height: 5\' 3"  (160 cm) Weight: 165 lb 5.5 oz (75 kg) IBW/kg (Calculated) : 52.4  Vital Signs: Temp: 98.2 F (36.8 C) (12/18 1755) Temp src: Oral (12/18 1755) BP: 136/75 mmHg (12/18 1755) Pulse Rate: 88 (12/18 1755) Intake/Output from previous day:   Intake/Output from this shift: Total I/O In: 1950 [I.V.:1700; IV Piggyback:250] Out: 485 [Urine:260; Drains:25; Blood:200]  Labs: No results found for this basename: WBC, HGB, PLT, LABCREA, CREATININE,  in the last 72 hours Estimated Creatinine Clearance: 62.5 ml/min (by C-G formula based on Cr of 0.87). No results found for this basename: VANCOTROUGH, Leodis Binet, VANCORANDOM, GENTTROUGH, GENTPEAK, GENTRANDOM, TOBRATROUGH, TOBRAPEAK, TOBRARND, AMIKACINPEAK, AMIKACINTROU, AMIKACIN,  in the last 72 hours   Microbiology: Recent Results (from the past 720 hour(s))  SURGICAL PCR SCREEN     Status: Abnormal   Collection Time    02/10/13  8:43 AM      Result Value Range Status   MRSA, PCR NEGATIVE  NEGATIVE Final   Staphylococcus aureus POSITIVE (*) NEGATIVE Final   Comment:            The Xpert SA Assay (FDA     approved for NASAL specimens     in patients over 50 years of age),     is one component of     a comprehensive surveillance     program.  Test performance has     been validated by [employer] for patients greater     than or equal to 37 year old.     It is not intended     to diagnose infection nor to     guide or monitor treatment.    Medical History: Past Medical History  Diagnosis Date  . 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 PONV (postoperative nausea and vomiting)   . Hypertension   . Diabetes mellitus without complication   . Chronic kidney disease     abnormal labs  .  Fibromyalgia   . Arthritis    Assessment: 67 YOF s/p spinal surgery currently has hemovac placed. Pharmacy is consulted to dose vancomycin for post-op prophylaxis. Per consult, for 1 dose post-op. Scr 0.87 on 12/10,  Est. crcl ~ 60 - 65 ml/min. She received one dose of vancomycin 1g at 1315 before surgery.   Goal of Therapy:  Prevent surgical infections.  Plan:  - Vancomycin 1g IV x 1 at 0100 tomorrow AM - Will f/u drain status tomorrow.  - MD please address if she will need additional antibiotics since she does have a drain placed currently.  Bayard Hugger, PharmD, BCPS  Clinical Pharmacist  Pager: [phone]   02/18/2013,6:31 PM

## 2013-02-18 NOTE — Anesthesia Preprocedure Evaluation (Signed)
Anesthesia Evaluation  Patient identified by MRN, date of birth, ID band Patient awake    Reviewed: Allergy & Precautions, H&P , NPO status , Patient's Chart, lab work & pertinent test results  History of Anesthesia Complications (+) PONV and history of anesthetic complications  Airway Mallampati: II  Neck ROM: full    Dental   Pulmonary          Cardiovascular hypertension,     Neuro/Psych  Neuromuscular disease    GI/Hepatic   Endo/Other  diabetes, Type 2  Renal/GU      Musculoskeletal  (+) Arthritis -, Fibromyalgia -  Abdominal   Peds  Hematology   Anesthesia Other Findings   Reproductive/Obstetrics                           Anesthesia Physical Anesthesia Plan  ASA: II  Anesthesia Plan: General   Post-op Pain Management:    Induction: Intravenous  Airway Management Planned: Oral ETT  Additional Equipment:   Intra-op Plan:   Post-operative Plan: Extubation in OR  Informed Consent: I have reviewed the patients History and Physical, chart, labs and discussed the procedure including the risks, benefits and alternatives for the proposed anesthesia with the patient or authorized representative who has indicated his/her understanding and acceptance.     Plan Discussed with: CRNA, Anesthesiologist and Surgeon  Anesthesia Plan Comments:         Anesthesia Quick Evaluation

## 2013-02-18 NOTE — Anesthesia Procedure Notes (Signed)
Procedure Name: Intubation Date/Time: 02/18/2013 1:22 PM Performed by: Gayla Medicus Pre-anesthesia Checklist: Patient identified, Patient being monitored, Emergency Drugs available, Timeout performed and Suction available Patient Re-evaluated:Patient Re-evaluated prior to inductionOxygen Delivery Method: Circle system utilized Preoxygenation: Pre-oxygenation with 100% oxygen Intubation Type: IV induction Ventilation: Mask ventilation without difficulty Laryngoscope Size: Mac and 3 Grade View: Grade I Tube type: Oral Tube size: 7.5 mm Number of attempts: 1 Airway Equipment and Method: Stylet Placement Confirmation: ETT inserted through vocal cords under direct vision,  positive ETCO2 and breath sounds checked- equal and bilateral Secured at: 21 cm Tube secured with: Tape Dental Injury: Teeth and Oropharynx as per pre-operative assessment

## 2013-02-19 ENCOUNTER — Encounter (HOSPITAL_COMMUNITY): Payer: Self-pay | Admitting: Cardiology

## 2013-02-19 ENCOUNTER — Encounter (HOSPITAL_COMMUNITY)
Admission: RE | Disposition: A | Discharge status: Home / Self Care | Payer: Self-pay | Source: Ambulatory Visit | Attending: Neurological Surgery

## 2013-02-19 DIAGNOSIS — I369 Nonrheumatic tricuspid valve disorder, unspecified: Secondary | ICD-10-CM

## 2013-02-19 DIAGNOSIS — E785 Hyperlipidemia, unspecified: Secondary | ICD-10-CM | POA: Diagnosis present

## 2013-02-19 DIAGNOSIS — R570 Cardiogenic shock: Secondary | ICD-10-CM | POA: Diagnosis not present

## 2013-02-19 DIAGNOSIS — E1165 Type 2 diabetes mellitus with hyperglycemia: Secondary | ICD-10-CM | POA: Diagnosis present

## 2013-02-19 DIAGNOSIS — I10 Essential (primary) hypertension: Secondary | ICD-10-CM | POA: Diagnosis present

## 2013-02-19 DIAGNOSIS — I2129 ST elevation (STEMI) myocardial infarction involving other sites: Secondary | ICD-10-CM | POA: Diagnosis not present

## 2013-02-19 DIAGNOSIS — I952 Hypotension due to drugs: Secondary | ICD-10-CM | POA: Diagnosis not present

## 2013-02-19 LAB — COMPREHENSIVE METABOLIC PANEL
ALT: 16 U/L (ref 0–35)
Albumin: 3.3 g/dL — ABNORMAL LOW (ref 3.5–5.2)
BUN: 18 mg/dL (ref 6–23)
Calcium: 6.8 mg/dL — ABNORMAL LOW (ref 8.4–10.5)
Creatinine, Ser: 1.02 mg/dL (ref 0.50–1.10)
GFR calc Af Amer: 65 mL/min — ABNORMAL LOW (ref >90)
GFR calc non Af Amer: 56 mL/min — ABNORMAL LOW (ref >90)
Glucose, Bld: 270 mg/dL — ABNORMAL HIGH (ref 70–99)
Total Protein: 6.3 g/dL (ref 6.0–8.3)

## 2013-02-19 LAB — TROPONIN I
Troponin I: 1.09 ng/mL (ref <0.30)
Troponin I: 2.56 ng/mL (ref <0.30)

## 2013-02-19 LAB — CK TOTAL AND CKMB (NOT AT ARMC)
Relative Index: 3.7 — ABNORMAL HIGH (ref 0.0–2.5)
Total CK: 222 U/L — ABNORMAL HIGH (ref 7–177)

## 2013-02-19 LAB — POCT I-STAT 3, VENOUS BLOOD GAS (G3P V)
Acid-base deficit: 9 mmol/L — ABNORMAL HIGH (ref 0.0–2.0)
O2 Saturation: 51 %
pO2, Ven: 32 mmHg (ref 30.0–45.0)

## 2013-02-19 LAB — CBC WITH DIFFERENTIAL/PLATELET
Basophils Absolute: 0 10*3/uL (ref 0.0–0.1)
Eosinophils Absolute: 0 10*3/uL (ref 0.0–0.7)
Eosinophils Relative: 0 % (ref 0–5)
HCT: 32.9 % — ABNORMAL LOW (ref 36.0–46.0)
Hemoglobin: 11.3 g/dL — ABNORMAL LOW (ref 12.0–15.0)
Lymphocytes Relative: 8 % — ABNORMAL LOW (ref 12–46)
MCH: 32.7 pg (ref 26.0–34.0)
MCHC: 34.3 g/dL (ref 30.0–36.0)
Monocytes Absolute: 1.3 10*3/uL — ABNORMAL HIGH (ref 0.1–1.0)
Neutrophils Relative %: 87 % — ABNORMAL HIGH (ref 43–77)
Platelets: 396 10*3/uL (ref 150–400)
RBC: 3.46 MIL/uL — ABNORMAL LOW (ref 3.87–5.11)
RDW: 13.8 % (ref 11.5–15.5)
WBC: 25.9 10*3/uL — ABNORMAL HIGH (ref 4.0–10.5)

## 2013-02-19 SURGERY — LEFT HEART CATHETERIZATION WITH CORONARY ANGIOGRAM
Anesthesia: LOCAL

## 2013-02-19 MED ORDER — METOPROLOL TARTRATE 1 MG/ML IV SOLN
INTRAVENOUS | Status: AC
  Filled 2013-02-19: qty 5

## 2013-02-19 MED ORDER — ASPIRIN 81 MG PO CHEW
CHEWABLE_TABLET | ORAL | Status: AC
  Filled 2013-02-19: qty 3

## 2013-02-19 MED ORDER — NITROGLYCERIN 0.4 MG SL SUBL
SUBLINGUAL_TABLET | SUBLINGUAL | Status: AC
  Administered 2013-02-19: 0.4 mg
  Administered 2013-02-19: 0.4 mg via SUBLINGUAL
  Filled 2013-02-19: qty 25

## 2013-02-19 MED ORDER — FUROSEMIDE 10 MG/ML IJ SOLN
80.0000 mg | INTRAMUSCULAR | Status: AC
  Administered 2013-02-20: 80 mg via INTRAVENOUS

## 2013-02-19 MED ORDER — FENTANYL CITRATE 0.05 MG/ML IJ SOLN
INTRAMUSCULAR | Status: AC
  Administered 2013-02-19: 25 ug
  Filled 2013-02-19: qty 2

## 2013-02-19 MED ORDER — FUROSEMIDE 10 MG/ML IJ SOLN
INTRAMUSCULAR | Status: AC
  Administered 2013-02-19: 20 mg via INTRAVENOUS
  Filled 2013-02-19: qty 4

## 2013-02-19 MED ORDER — ATORVASTATIN CALCIUM 80 MG PO TABS
80.0000 mg | ORAL_TABLET | Freq: Every day | ORAL | Status: DC
  Administered 2013-02-19 – 2013-02-27 (×9): 80 mg via ORAL
  Filled 2013-02-19 (×10): qty 1

## 2013-02-19 MED ORDER — METOPROLOL TARTRATE 25 MG PO TABS: 25.0000 mg | ORAL_TABLET | Freq: Every day | ORAL | Status: DC

## 2013-02-19 MED ORDER — SODIUM CHLORIDE 0.9 % IV SOLN
INTRAVENOUS | Status: DC
  Administered 2013-02-19 (×2): via INTRAVENOUS

## 2013-02-19 MED ORDER — MORPHINE SULFATE 2 MG/ML IJ SOLN
INTRAMUSCULAR | Status: AC
  Filled 2013-02-19: qty 1

## 2013-02-19 MED ORDER — MIDAZOLAM HCL 2 MG/2ML IJ SOLN
INTRAMUSCULAR | Status: AC
  Administered 2013-02-19: 1 mg
  Filled 2013-02-19: qty 2

## 2013-02-19 MED ORDER — FUROSEMIDE 10 MG/ML IJ SOLN
20.0000 mg | Freq: Once | INTRAMUSCULAR | Status: AC
  Administered 2013-02-19 (×2): 20 mg via INTRAVENOUS

## 2013-02-19 MED ORDER — MILRINONE IN DEXTROSE 20 MG/100ML IV SOLN
0.2500 ug/kg/min | INTRAVENOUS | Status: DC
  Administered 2013-02-20 (×2): 0.375 ug/kg/min via INTRAVENOUS
  Administered 2013-02-20 – 2013-02-21 (×2): 0.25 ug/kg/min via INTRAVENOUS
  Filled 2013-02-19 (×3): qty 100

## 2013-02-19 MED ORDER — FUROSEMIDE 10 MG/ML IJ SOLN
INTRAMUSCULAR | Status: AC
Start: 2013-02-19 — End: 2013-02-20
  Filled 2013-02-19: qty 8

## 2013-02-19 MED ORDER — MILRINONE IN DEXTROSE 20 MG/100ML IV SOLN: 0.2500 ug/kg/min | INTRAVENOUS | Status: DC

## 2013-02-19 MED ORDER — FUROSEMIDE 10 MG/ML IJ SOLN: 20.0000 mg | Freq: Once | INTRAMUSCULAR | Status: DC

## 2013-02-19 MED ORDER — NOREPINEPHRINE BITARTRATE 1 MG/ML IJ SOLN
2.0000 ug/min | INTRAVENOUS | Status: DC
  Administered 2013-02-19: 4 ug/min via INTRAVENOUS
  Administered 2013-02-19: 16 ug/min via INTRAVENOUS
  Filled 2013-02-19 (×2): qty 4

## 2013-02-19 MED ORDER — ASPIRIN 81 MG PO CHEW
CHEWABLE_TABLET | ORAL | Status: AC
  Filled 2013-02-19: qty 1

## 2013-02-19 NOTE — Progress Notes (Signed)
Patient ID: Sarah Flores, female   DOB: 01-02-1946, 67 y.o.   MRN: [id number] Events noted. Discussed situation at length with Dr Herbie Baltimore at bedside. Very high risk of surgical complication with anticoagulation at present (only 21 hrs out of surgery). Continue Beta blockers, nitrates, and ASA for now. She is almost pain free at present and STE has improved.

## 2013-02-19 NOTE — Progress Notes (Signed)
UR completed 

## 2013-02-19 NOTE — Care Management Note (Signed)
    Page 1 of 1   02/19/2013     12:44:39 PM   CARE MANAGEMENT NOTE 02/19/2013  Patient:  Sarah Flores, Sarah Flores   Account Number:  [id number]  Date Initiated:  02/19/2013  Documentation initiated by:  Sarah Flores  Subjective/Objective Assessment:   adm w laminectomy, ch pain today and transf to ccu     Action/Plan:   lives w husband, pc dr Sarah Flores   Anticipated DC Date:     Anticipated DC Plan:        DC Planning Services  CM consult      Choice offered to / List presented to:             Status of service:   Medicare Important Message given?   (If response is "NO", the following Medicare IM given date fields will be blank) Date Medicare IM given:   Date Additional Medicare IM given:    Discharge Disposition:    Per UR Regulation:  Reviewed for med. necessity/level of care/duration of stay  If discussed at Long Length of Stay Meetings, dates discussed:    Comments:  12/19 1243p debbie Sarah Coverdale rn,bsn pt transf to ccu. had been up w phy ther and doing well. had home eq. will moniter now for dc needs as pt progresses.

## 2013-02-19 NOTE — Evaluation (Signed)
Occupational Therapy Evaluation Patient Details Name: Sarah Flores MRN: [id number] DOB: Jan 22, 1946 Today's Date: 02/19/2013 Time: [phone] (5 minutes over lapped with  PT Devon) OT Time Calculation (min): 25 min  OT Assessment / Plan / Recommendation History of present illness 67 yo s/p PLIF L5- S1   Clinical Impression   Patient is s/p L5-S1 PLIF surgery resulting in functional limitations due to the deficits listed below (see OT problem list).  Patient will benefit from skilled OT acutely to increase independence and safety with ADLS to allow discharge return home.     OT Assessment  Patient needs continued OT Services    Follow Up Recommendations  No OT follow up    Barriers to Discharge      Equipment Recommendations  None recommended by OT    Recommendations for Other Services    Frequency  Min 2X/week    Precautions / Restrictions Precautions Precautions: Back Precaution Booklet Issued: Yes (comment) Precaution Comments: educated and reviewed via teachback Required Braces or Orthoses: Spinal Brace Spinal Brace: Lumbar corset   Pertinent Vitals/Pain 8 out 10    ADL  Eating/Feeding: Independent Where Assessed - Eating/Feeding: Chair Grooming: Teeth care;Wash/dry hands;Supervision/safety Where Assessed - Grooming: Unsupported standing Lower Body Dressing:  (unable to cross bil LE) Toilet Transfer: Min Pension scheme manager Method: Sit to Barista: Regular height toilet Equipment Used: Gait belt;Back brace;Rolling walker Transfers/Ambulation Related to ADLs: Pt ambulating with supervision v/c to relax shoulders  ADL Comments: Pt educated on back precautions and able to recall 3 out 3 precautions at end of session. Next session to address lB dressing. pt is currently unable to cross bil LE.    OT Diagnosis: Generalized weakness;Acute pain  OT Problem List: Decreased strength;Decreased activity tolerance;Impaired balance (sitting and/or  standing);Decreased knowledge of use of DME or AE;Pain OT Treatment Interventions: Self-care/ADL training;DME and/or AE instruction;Therapeutic activities;Balance training;Patient/family education   OT Goals(Current goals can be found in the care plan section) Acute Rehab OT Goals Patient Stated Goal: to go home OT Goal Formulation: With patient Time For Goal Achievement: 03/05/13 Potential to Achieve Goals: Good  Visit Information  Last OT Received On: 02/19/13 Assistance Needed: +1 PT/OT/SLP Co-Evaluation/Treatment: Yes Reason for Co-Treatment: For patient/therapist safety PT goals addressed during session: Mobility/safety with mobility;Proper use of DME OT goals addressed during session: ADL's and self-care History of Present Illness: 67 yo s/p PLIF L5- S1       Prior Functioning     Home Living Family/patient expects to be discharged to:: Private residence Living Arrangements: Spouse/significant other Available Help at Discharge: Family Type of Home: House Home Access: Ramped entrance Home Layout: Two level;Able to live on main level with bedroom/bathroom Home Equipment: Dan Humphreys - 2 wheels;Walker - 4 wheels;Cane - quad;Shower seat;Grab bars - toilet;Grab bars - tub/shower;Hand held shower head Prior Function Level of Independence: Independent Communication Communication:  (wears glasses) Dominant Hand: Right         Vision/Perception Vision - History Baseline Vision: Wears glasses all the time Patient Visual Report: No change from baseline   Cognition  Cognition Arousal/Alertness: Awake/alert Behavior During Therapy: WFL for tasks assessed/performed Overall Cognitive Status: Within Functional Limits for tasks assessed    Extremity/Trunk Assessment Upper Extremity Assessment Upper Extremity Assessment: Overall WFL for tasks assessed Lower Extremity Assessment Lower Extremity Assessment: Defer to PT evaluation     Mobility Bed Mobility Bed Mobility:  Sitting - Scoot to Edge of Bed Sitting - Scoot to Edge of Bed: 5: Supervision  Transfers Sit to Stand: 5: Supervision Stand to Sit: 5: Supervision Details for Transfer Assistance: VCs for hand placement      Exercise     Balance High Level Balance High Level Balance Activites: Side stepping;Backward walking;Direction changes;Turns High Level Balance Comments: supervision    End of Session OT - End of Session Activity Tolerance: Patient tolerated treatment well Patient left: in chair;with call bell/phone within reach Nurse Communication: Mobility status;Precautions  GO     Harolyn Rutherford 02/19/2013, 9:40 AM Pager: [phone]

## 2013-02-19 NOTE — Progress Notes (Signed)
Pt was out of bed in chair and for about 40 minutes and stareted c/o not feeling well aroung 1120 as PT came into work with Pt  Primary RN call into room and found pt in chair flushed c/o chest pain and not feeling well .Rapid response team called in and they responded immediately .EKG was started pt assisted back to bed still flushed and c/o severe chest pain  "Stated as if my chest is about to explode"  BP was checked unable to record, repeated and was 207/148  rechecked and  Was 177/124 HR 137 thready and paint  Nitrate 4mg  sublingual was given ,zofran 4 mg for nausea pt very diaphoretic CBG 360 morphine 2 mg iv also given at  1140 and pt verbalized  Some relief  of pain . EKG done was abnormal Md jones notified and code stem called. 4 baby ASA given po and  At 1150. BP dropped to 60/54 IV NS 500 bolus given and a 1000 cc restarted at 1200 Bp up 93/62  HR 107 but kept fluctuating. B/t  249  -130 s b/min  At 1205 BP was 120/79  HR 137 T 96 -97 . Pt became more stable whiles the cardiologist and the stemi team continue to work on pt.   Bed placement requested and report called to receiving unit . Pt transferred to Unit 2 H Rm04 by stemi team  along with family members and pts belonging.

## 2013-02-19 NOTE — Evaluation (Addendum)
Physical Therapy Evaluation Patient Details Name: Sarah Flores MRN: [id number] DOB: 1945/06/12 Today's Date: 02/19/2013 Time: 7829-5621 PT Time Calculation (min): 35 min  PT Assessment / Plan / Recommendation History of Present Illness  67 yo s/p PLIF L5- S1  Clinical Impression  Patient demonstrates deficits in functional mobility as indicated below. Patient will benefit from continued skilled PT to address deficits and maximize function. Will continue to see as indicated and progress as tolerated.     PT Assessment  Patient needs continued PT services    Follow Up Recommendations  Supervision/Assistance - 24 hour          Equipment Recommendations  None recommended by PT       Frequency Min 5X/week    Precautions / Restrictions Precautions Precautions: Back Precaution Booklet Issued: Yes (comment) Precaution Comments: educated and reviewed via teachback Required Braces or Orthoses: Spinal Brace Spinal Brace: Lumbar corset   Pertinent Vitals/Pain 8/10      Mobility  Bed Mobility Bed Mobility: Sitting - Scoot to Edge of Bed Sitting - Scoot to Edge of Bed: 5: Supervision Transfers Transfers: Sit to Stand;Stand to Sit Sit to Stand: 5: Supervision Stand to Sit: 5: Supervision Details for Transfer Assistance: VCs for hand placement  Ambulation/Gait Ambulation/Gait Assistance: 5: Supervision Ambulation Distance (Feet): 840 Feet Assistive device: Rolling walker Ambulation/Gait Assistance Details: steady with ambulation Gait Pattern: Step-through pattern;Decreased stride length General Gait Details: overall steady with mobility        PT Diagnosis: Difficulty walking;Acute pain  PT Problem List: Decreased strength;Decreased activity tolerance;Decreased mobility;Pain PT Treatment Interventions: DME instruction;Gait training;Functional mobility training;Therapeutic activities;Therapeutic exercise;Balance training     PT Goals(Current goals can be found in the  care plan section) Acute Rehab PT Goals Patient Stated Goal: to go home PT Goal Formulation: With patient/family Time For Goal Achievement: 03/05/13 Potential to Achieve Goals: Good  Visit Information  Last PT Received On: 02/19/13 Assistance Needed: +1 PT/OT/SLP Co-Evaluation/Treatment: Yes (partial session together) Reason for Co-Treatment: For patient/therapist safety PT goals addressed during session: Mobility/safety with mobility;Proper use of DME OT goals addressed during session: ADL's and self-care History of Present Illness: 67 yo s/p PLIF L5- S1       Prior Functioning  Home Living Family/patient expects to be discharged to:: Private residence Living Arrangements: Spouse/significant other Available Help at Discharge: Family Type of Home: House Home Access: Ramped entrance Home Layout: Two level;Able to live on main level with bedroom/bathroom Home Equipment: Dan Humphreys - 2 wheels;Walker - 4 wheels;Cane - quad;Shower seat;Grab bars - toilet;Grab bars - tub/shower Prior Function Level of Independence: Independent Communication Communication:  (wears glasses) Dominant Hand: Right    Cognition  Cognition Arousal/Alertness: Awake/alert Behavior During Therapy: WFL for tasks assessed/performed Overall Cognitive Status: Within Functional Limits for tasks assessed    Extremity/Trunk Assessment Upper Extremity Assessment Upper Extremity Assessment: Overall WFL for tasks assessed Lower Extremity Assessment Lower Extremity Assessment: Defer to PT evaluation   Balance High Level Balance High Level Balance Activites: Side stepping;Backward walking;Direction changes;Turns High Level Balance Comments: supervision   End of Session PT - End of Session Equipment Utilized During Treatment: Gait belt Activity Tolerance: Patient tolerated treatment well;Patient limited by pain Patient left: in chair;with call bell/phone within reach;with family/visitor present  GP     Fabio Asa 02/19/2013, 9:20 AM Sarah Flores, PT DPT  [phone]

## 2013-02-19 NOTE — Progress Notes (Signed)
  Echocardiogram 2D Echocardiogram has been performed.  Sarah Flores 02/19/2013, 7:27 PM

## 2013-02-19 NOTE — Progress Notes (Signed)
  Patient had been seen earlier in the day by therapy services. Upon walking by the room later in the day, this therapist overheard patient state to her husband "something is happening, my chest, something is wrong" upon hearing this I entered the room to check on the patient and ask if everything was ok or if she needed anything. Patient was sitting upright in chair clenching her chest. I immediately called out to my coworker who initiated a rapid response and contacted closest available nurse. Assisted with vitals assessment and transfer of patient back to bed from recliner, left patient with nsg staff and rapid response team. Patient was not performing therapy of any kind at time of event. Will hold therapy services until patient medically stable and appropriate.  Charlotte Crumb, PT DPT  [phone]

## 2013-02-19 NOTE — Progress Notes (Signed)
Patient ID: BRISSA ASANTE, female   DOB: 1945-11-20, 67 y.o.   MRN: [id number] Subjective: Patient reports she is doing well. She has some nausea. She has no leg pain or numbness tingling or weakness. She has appropriate back soreness.  Objective: Vital signs in last 24 hours: Temp:  [97.6 F (36.4 C)-98.6 F (37 C)] 97.6 F (36.4 C) (12/19 0425) Pulse Rate:  [79-91] 85 (12/19 0425) Resp:  [15-20] 16 (12/19 0425) BP: (98-162)/(49-80) 113/69 mmHg (12/19 0425) SpO2:  [90 %-99 %] 97 % (12/19 0425) Weight:  [75 kg (165 lb 5.5 oz)] 75 kg (165 lb 5.5 oz) (12/18 1755)  Intake/Output from previous day: 12/18 0701 - 12/19 0700 In: 1950 [I.V.:1700; IV Piggyback:250] Out: 540 [Urine:260; Drains:80; Blood:200] Intake/Output this shift:    Neurologic: Grossly normal  Lab Results: Lab Results  Component Value Date   WBC 11.1* 02/10/2013   HGB 14.1 02/10/2013   HCT 40.5 02/10/2013   MCV 94.6 02/10/2013   PLT 397 02/10/2013   Lab Results  Component Value Date   INR 1.01 02/10/2013   BMET Lab Results  Component Value Date   NA 136 02/10/2013   K 4.1 02/10/2013   CL 95* 02/10/2013   CO2 25 02/10/2013   GLUCOSE 180* 02/10/2013   BUN 9 02/10/2013   CREATININE 0.87 02/10/2013   CALCIUM 8.4 02/10/2013    Studies/Results: Dg Lumbar Spine 2-3 Views  02/18/2013   CLINICAL DATA:  L5-S1 PLIF  EXAM: LUMBAR SPINE - 2-3 VIEW; DG C-ARM 61-120 MIN  COMPARISON:  Lumbar myelogram 01/06/2013  FINDINGS: Bilateral pedicle screw and interbody fusion L5-S1 in satisfactory position. Normal alignment of the lumbosacral junction. No acute complication.  IMPRESSION: Satisfactory PLIF L5-S1.   Electronically Signed   By: Marlan Palau M.D.   On: 02/18/2013 15:55   Dg C-arm 61-120 Min  02/18/2013   CLINICAL DATA:  L5-S1 PLIF  EXAM: LUMBAR SPINE - 2-3 VIEW; DG C-ARM 61-120 MIN  COMPARISON:  Lumbar myelogram 01/06/2013  FINDINGS: Bilateral pedicle screw and interbody fusion L5-S1 in satisfactory  position. Normal alignment of the lumbosacral junction. No acute complication.  IMPRESSION: Satisfactory PLIF L5-S1.   Electronically Signed   By: Marlan Palau M.D.   On: 02/18/2013 15:55    Assessment/Plan: Doing very well postop day 1 after L5-S1 PLIF. Likely home tomorrow   LOS: 1 day    Xzaviar Maloof S 02/19/2013, 8:05 AM

## 2013-02-19 NOTE — Progress Notes (Addendum)
   Patient POD #1 lumbar decompression. Developed CP and transient ST elevation earlier today. Evaluated by the interventional team earlier today and managed medically as ST elevation resolved and patient at high-risk for spinal bleeding and subsequent complications.  Remains CP free. Recent troponin 2.5 however patient becoming more hemodynamically tenuous. SBP ~ 90 on 16 mcg/min norepinephrine. Echo reviewed EF 20-25% with essentially akinesis of anterior, anteroseptal, apical and mid to distal lateral and inferior walls.  Case discussed with Dr. Zachery Conch and we decided to place PA catheter for hemodynamic montoring and titration of vasoactive drips +/- to assess need for IABP.   On exam mildly diaphoretic and anxious but relatively comfortable JVP up Cor RRR no obvious murmur and gallop Ab soft Ext: cool. No edema  A:  1. Cardiogenic shock 2. Anterolateral MI with transient ST elevation 3. POD #1 lumbar decompression 4. DM2  P:  Will place PA catheter for further monitoring/management and to assess need for possible IABP.  Total critical care time not including PA catheter placement is 55 minutes.   Alivya Wegman,MD 11:34 PM

## 2013-02-19 NOTE — Significant Event (Addendum)
Rapid Response Event Note  Overview:  Called to see patient for sudden onset crushing chest pain.   Time Called: 1122 Arrival Time: 1129 Event Type: Cardiac  Initial Focused Assessment:  On arrival patient being assisted back to bed with PT and RN staff.  Face flushed.  Very diaphoretic.  Alert -states she has crushing chest pain  10/10 with associated nausea, diaphoresis and SOB.  Skin hot and moist.  BP 240/130 HR 135 RR 28 O2 sats 98% .  Bil BS clear.  Pain radiating to left arm with tingling in fingers.  Patient states she has never had this severe pain before.  She was sitting in chair with sudden onset.   Interventions: Placed in bed - stat 12 lead EKG done with evidence of new ST elevation in lateral leads - 1131:   0.4 mg NTG SL given - placed on continuous EKG monitoring - O2 placed at 4 liter nasal cannula. [facility] called and STEMI activated - Dr. Yetta Barre in OR - he was updated with info per Ester RN.  1135:  BP 154/121  HR 135 with some wide complex runs.  1135:  Zofran 4 mg IV given for nausea.  1138:  BP 123/62 - CP remains at 8/10.  NTG 0.4 mg SL given.  1145:  CP now at 4/10.  Morphine 2 mg IV given.  1150:  CP resolving 2/10.  BP 65/1 - NS bolus started.  Dr. Herbie Baltimore and Marchelle Folks RN from STEMI team present - speaking with patient and family.  1200:  120/79 HR 105 NSR - ST elevation resolving.  CP now described as "small amount of pressure center of chest with left hand tingling."  Patient states she feels 100% better.  Narrow complex SR on monitor.  12 lead repeated.  1210:  BP 70/42 HR 103 RR 20 O2sats 98%.  NS bolus restarted - Dr. Herbie Baltimore aware.  No CP at present.  No nausea - skin now warm dry.  1220:  BP 72/42 HR 103 - no CP - Dr. Herbie Baltimore aware - NS continue - report called to [facility] by Ester RN.  1230  Transported to 2H04 via bed with Zoll pads on - handoff to [employer].  Support to husband.  Dr. Herbie Baltimore present - updated.     Event Summary: Name of Physician Notified: Dr. Yetta Barre at  1122  Name of Consulting Physician Notified: Dr. Herbie Baltimore - paged as CODE STEMI by RRT RN at 1135  Outcome: Transferred (Comment) [phone])  Event End Time: 1245  [place]

## 2013-02-19 NOTE — Consult Note (Signed)
Reason for Consult: STEMI   Referring Physician: Dr. Yetta Barre PCP: Feliciana Rossetti, MD Primary Cardiologist:  Dr. Milagros Loll  Sarah Flores is an 67 y.o. female.    Chief Complaint: chest pain- crushing   HPI: 67 year old WF with apparent good health treated for HTN and DM undrwent decompression lumbar lam, L5-S1 along with post. Fusion and fixation. 02/18/13.  She did well and ambulated in the hall.  At end of ambulation she began having severe crushing mid sternal chest pain.  EKG with ST elevation in Lateral leads.  Dr. Herbie Baltimore was paged.  She was given 2 NTG SL along with 4 baby ASA.  Her BP had been significantly elevated with the pain- but with NTG her BP dropped significantly and fluid bolus given, 1 liter.  She was transferred to 2 Heart Unit.  Pain is resolving and EKG improved by 3rd EKG.    Trace of chest pain currently.  BP continues to be low.  IV fluids continue,  Dr. Herbie Baltimore present at the bedside throughout.       Past Medical History  Diagnosis Date  . 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 PONV (postoperative nausea and vomiting)   . Hypertension   . Chronic kidney disease     abnormal labs  . Fibromyalgia   . Family history of anesthesia complication     "son goes crazy; hits people" (02/18/2013)  . 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 Pneumonia 1950; ~ 1960; ~ 1967  . Chronic bronchitis     "usually get it q other year" (02/18/2013)  . Type II diabetes mellitus   . Arthritis     "all my joints" (02/18/2013)  . ST elevation myocardial infarction (STEMI) of lateral wall 02/19/2013  . HTN (hypertension) 02/19/2013  . Dyslipidemia, goal LDL below 70 02/19/2013  . Diabetes 02/19/2013    Past Surgical History  Procedure Laterality Date  . Parathyroidectomy  08/07/09    [facility]  . Colonoscopy w/ biopsies    .  Laryngoscopy      laryngoscopy micro suspension with left Cymetra injection for dysphonia 09/29/09 [facility])  . Posterior lumbar fusion  02/18/2013  . Tonsillectomy  1950's  . Appendectomy  02/1966  . Cholecystectomy  ~2001  . Tubal ligation  1979  . Plantar's wart excision Bilateral 1960's; 1980  . Cardiac catheterization  1990 X2  . Abdominal hysterectomy  [id number]    Family History  Problem Relation Age of Onset  . Heart attack Mother 75  . Cancer Mother   . Heart attack Father 53  . Cancer Father    Social History:  reports that she has never smoked. She has never used smokeless tobacco. She reports that she does not drink alcohol or use illicit drugs.  Allergies:  Allergies  Allergen Reactions  . Penicillins Anaphylaxis    Medications Prior to Admission  Medication Sig Dispense Refill  . DULoxetine (CYMBALTA) 60 MG capsule Take 60 mg by mouth daily.      Marland Kitchen glipiZIDE (GLUCOTROL XL) 10 MG 24 hr tablet Take 10 mg by mouth 2 (two) times daily.      Marland Kitchen HYDROcodone-acetaminophen (NORCO) 7.5-325 MG per tablet Take 1 tablet by mouth every 6 (six) hours as needed (for pain).      Marland Kitchen  losartan (COZAAR) 100 MG tablet Take 100 mg by mouth daily.      . metFORMIN (GLUCOPHAGE) 1000 MG tablet Take 1,000 mg by mouth 2 (two) times daily with a meal.      . metoprolol (LOPRESSOR) 50 MG tablet Take 50 mg by mouth daily.      . sitaGLIPtin (JANUVIA) 100 MG tablet Take 100 mg by mouth daily.      Marland Kitchen triamterene-hydrochlorothiazide (MAXZIDE-25) 37.5-25 MG per tablet Take 1 tablet by mouth daily.      . Vitamin D, Ergocalciferol, (DRISDOL) 50000 UNITS CAPS capsule Take 50,000 Units by mouth every 7 (seven) days. Wednesdays        Results for orders placed during the hospital encounter of 02/18/13 (from the past 48 hour(s))  GLUCOSE, CAPILLARY     Status: None   Collection Time    02/18/13 12:17 PM      Result Value Range   Glucose-Capillary 78  70 - 99 mg/dL  GLUCOSE, CAPILLARY     Status: Abnormal    Collection Time    02/18/13  4:25 PM      Result Value Range   Glucose-Capillary 112 (*) 70 - 99 mg/dL   Dg Lumbar Spine 2-3 Views  02/18/2013   CLINICAL DATA:  L5-S1 PLIF  EXAM: LUMBAR SPINE - 2-3 VIEW; DG C-ARM 61-120 MIN  COMPARISON:  Lumbar myelogram 01/06/2013  FINDINGS: Bilateral pedicle screw and interbody fusion L5-S1 in satisfactory position. Normal alignment of the lumbosacral junction. No acute complication.  IMPRESSION: Satisfactory PLIF L5-S1.   Electronically Signed   By: Marlan Palau M.D.   On: 02/18/2013 15:55   Dg C-arm 61-120 Min  02/18/2013   CLINICAL DATA:  L5-S1 PLIF  EXAM: LUMBAR SPINE - 2-3 VIEW; DG C-ARM 61-120 MIN  COMPARISON:  Lumbar myelogram 01/06/2013  FINDINGS: Bilateral pedicle screw and interbody fusion L5-S1 in satisfactory position. Normal alignment of the lumbosacral junction. No acute complication.  IMPRESSION: Satisfactory PLIF L5-S1.   Electronically Signed   By: Marlan Palau M.D.   On: 02/18/2013 15:55    ROS: General:no colds or fevers, no weight changes Skin:no rashes or ulcers HEENT:no blurred vision, no congestion CV:see HPI PUL:see HPI GI:no diarrhea constipation or melena, no indigestion GU:no hematuria, no dysuria MS:no joint pain, no claudication, + back pain, + fibromyalgia Neuro:no syncope, no lightheadedness Endo:+ diabetes, no thyroid disease   Blood pressure 207/148, pulse 78, temperature 98 F (36.7 C), temperature source Oral, resp. rate 16, height 5\' 3"  (1.6 m), weight 165 lb 5.5 oz (75 kg), SpO2 100.00%. PE: General:Pleasant affect, NAD Skin:Warm and dry, brisk capillary refill HEENT:normocephalic, sclera clear, mucus membranes moist Neck:supple, no JVD, no bruits  Heart:S1S2 RRR distant heart sounds without murmur, gallup, rub or click Lungs:clear, ant. without rales, rhonchi, or wheezes ZOX:WRUE, non tender, + BS, do not palpate liver spleen or masses Ext:no lower ext edema, 2+ pedal pulses, 2+ radial  pulses Neuro:alert and oriented, MAE, follows commands, + facial symmetry    Assessment/Plan Principal Problem:   S/P lumbar spinal fusion Active Problems:   ST elevation myocardial infarction (STEMI) of lateral wall   HTN (hypertension)   Dyslipidemia, goal LDL below 70   Diabetes  PLAN: Pt with lateral wall STEMI, now with mostly resolved pain and EKG  With resolved ST elevation.  BP continues to be low, will add levophed if BP remains low.  Unable to add anticoagulation due to recent lumbar surgery- today post op day 1.  Holding,  though she did receive today BB, ARB or NTG.  Add statin did receive asa.    [facility]  Nurse Practitioner Certified [facility] Pager [phone] or after 5pm or weekends call [phone] 02/19/2013, 1:05 PM  INTERVENTIONAL CARDIOLOGY CONSULT  I personally responded to CODE STEMI on 4N - upon my arrival, her Sx were starting to improve.  Had received NTG x 2 & Morphine.  Actually BP was in 70s systolic, but talking to me.  CP down to ~1/10.  Repeat (3rd ECG) showed resolution of Lateral STE with mild Q in aVL -- essentially consistent with reperfusion.    Upon Arrival, I discussed options with Dr. Yetta Barre -- POD#1 s/p lumbar spine Sgx.  Preferred Rx would be to avoid full anticoagulation x 3 days to avoid bleeding into the spinal column.  Lateral infarction with near resolution of STE, lower overall STEMI risk (which is ~significantly increased risk of Morbitdity & Mortality - at least 3-5 x higher).    For now, we are limited by her hypotension - after initial HYPERTENSION with crushing SSCP.  All of her BP meds (100mg  losartan, 50mg  metoprolol & 25 mg HCTZ had already been administered this AM) -- will d/c HCTZ & hold ARB until BP improves.  No room for nitrate, but she is essentially Angina free.    PLAN: as BP tolerates - use BB +/- Nitrate.  Once BP improves can restart ARB @ lower dose.  Initiate IV Heparin without bolus on  Sunday & plan for LHC/Angio +/- PCI on Monday with Dr. Allyson Sabal  2 D Echo  Daily ECG  DM care.   Add statin -- will convert to Crestor for d/c, previous intolerance to Lipitor.  Case discussed with Dr. Yetta Barre & the patient + husband/son.  Marykay Lex, M.D., M.S. [facility] [address] [place], [place]  [address]  [phone] Pager # [phone] 02/19/2013 1:17 PM

## 2013-02-19 NOTE — Procedures (Signed)
Pulmonary Artery Catheter Insertion Procedure Note Sarah Flores [id number] 04/09/1945  Procedure: Insertion of Central Venous Catheter Indications: Hemodynamic monitoring Operators: Kingsley Herandez, Friedman  Procedure Details Consent: Risks of procedure as well as the alternatives and risks of each were explained to the (patient/caregiver).  Consent for procedure obtained. Time Out: Verified patient identification, verified procedure, site/side was marked, verified correct patient position, special equipment/implants available, medications/allergies/relevent history reviewed, required imaging and test results available.  Performed  Maximum sterile technique was used including antiseptics, cap, gloves, gown, hand hygiene, mask and sheet. Skin prep: Chlorhexidine; local anesthetic administered An 8FR venous sheath was placed in the right internal jugular vein using a modified Seldinger technique. A standard Swan Ganz catheter was positioned into the pulmonary artery using pressure-wave guidance.   Evaluation Blood flow good Complications: No apparent complications Patient did tolerate procedure well. Chest X-ray ordered to verify placement.  CXR: pending.  Sarah Flores 02/19/2013, 11:23 PM

## 2013-02-20 ENCOUNTER — Inpatient Hospital Stay (HOSPITAL_COMMUNITY): Payer: Medicare Other

## 2013-02-20 DIAGNOSIS — R570 Cardiogenic shock: Secondary | ICD-10-CM

## 2013-02-20 DIAGNOSIS — Z981 Arthrodesis status: Secondary | ICD-10-CM

## 2013-02-20 DIAGNOSIS — I2129 ST elevation (STEMI) myocardial infarction involving other sites: Secondary | ICD-10-CM

## 2013-02-20 LAB — POCT I-STAT 3, VENOUS BLOOD GAS (G3P V)
Bicarbonate: 19 mEq/L — ABNORMAL LOW (ref 20.0–24.0)
TCO2: 20 mmol/L (ref 0–100)
pCO2, Ven: 40.7 mmHg — ABNORMAL LOW (ref 45.0–50.0)
pH, Ven: 7.276 (ref 7.250–7.300)
pO2, Ven: 23 mmHg — CL (ref 30.0–45.0)

## 2013-02-20 LAB — BASIC METABOLIC PANEL
BUN: 20 mg/dL (ref 6–23)
BUN: 18 mg/dL (ref 6–23)
CO2: 23 mEq/L (ref 19–32)
Calcium: 6.6 mg/dL — ABNORMAL LOW (ref 8.4–10.5)
Calcium: 7.3 mg/dL — ABNORMAL LOW (ref 8.4–10.5)
Chloride: 96 mEq/L (ref 96–112)
Chloride: 96 mEq/L (ref 96–112)
Creatinine, Ser: 1.06 mg/dL (ref 0.50–1.10)
GFR calc Af Amer: 70 mL/min — ABNORMAL LOW (ref >90)
GFR calc non Af Amer: 61 mL/min — ABNORMAL LOW (ref >90)
Glucose, Bld: 353 mg/dL — ABNORMAL HIGH (ref 70–99)
Glucose, Bld: 285 mg/dL — ABNORMAL HIGH (ref 70–99)
Sodium: 131 mEq/L — ABNORMAL LOW (ref 135–145)

## 2013-02-20 LAB — CBC
HCT: 37.4 % (ref 36.0–46.0)
MCH: 33.2 pg (ref 26.0–34.0)
MCHC: 35 g/dL (ref 30.0–36.0)
MCV: 94.9 fL (ref 78.0–100.0)
RDW: 13.6 % (ref 11.5–15.5)
WBC: 27.8 10*3/uL — ABNORMAL HIGH (ref 4.0–10.5)

## 2013-02-20 LAB — PRO B NATRIURETIC PEPTIDE: Pro B Natriuretic peptide (BNP): 12431 pg/mL — ABNORMAL HIGH (ref 0–125)

## 2013-02-20 LAB — CK TOTAL AND CKMB (NOT AT ARMC)
CK, MB: 13.6 ng/mL (ref 0.3–4.0)
Relative Index: 5.2 — ABNORMAL HIGH (ref 0.0–2.5)

## 2013-02-20 LAB — GLUCOSE, CAPILLARY
Glucose-Capillary: 257 mg/dL — ABNORMAL HIGH (ref 70–99)
Glucose-Capillary: 281 mg/dL — ABNORMAL HIGH (ref 70–99)

## 2013-02-20 LAB — CARBOXYHEMOGLOBIN
Carboxyhemoglobin: 1.6 % — ABNORMAL HIGH (ref 0.5–1.5)
Methemoglobin: 1.6 % — ABNORMAL HIGH (ref 0.0–1.5)
O2 Saturation: 81.3 %

## 2013-02-20 LAB — TSH: TSH: 1.37 u[IU]/mL (ref 0.350–4.500)

## 2013-02-20 MED ORDER — NOREPINEPHRINE BITARTRATE 1 MG/ML IJ SOLN
2.0000 ug/min | INTRAMUSCULAR | Status: DC
  Administered 2013-02-20: 10 ug/min via INTRAVENOUS
  Administered 2013-02-21: 12 ug/min via INTRAVENOUS
  Administered 2013-02-22: 10 ug/min via INTRAVENOUS
  Administered 2013-02-23: 2 ug/min via INTRAVENOUS
  Filled 2013-02-20 (×3): qty 16

## 2013-02-20 MED ORDER — SPIRONOLACTONE 12.5 MG HALF TABLET
12.5000 mg | ORAL_TABLET | Freq: Every day | ORAL | Status: DC
  Administered 2013-02-20 – 2013-02-28 (×8): 12.5 mg via ORAL
  Filled 2013-02-20 (×9): qty 1

## 2013-02-20 MED ORDER — SODIUM CHLORIDE 0.9 % IV SOLN
INTRAVENOUS | Status: DC | PRN
  Administered 2013-02-19: 23:00:00 via INTRAVENOUS

## 2013-02-20 MED ORDER — SODIUM CHLORIDE 0.9 % IV SOLN
INTRAVENOUS | Status: DC | PRN
  Administered 2013-02-23: 14:00:00 via INTRAVENOUS

## 2013-02-20 MED ORDER — MAGNESIUM SULFATE 40 MG/ML IJ SOLN
4.0000 g | Freq: Once | INTRAMUSCULAR | Status: AC
  Administered 2013-02-20: 4 g via INTRAVENOUS
  Filled 2013-02-20: qty 100

## 2013-02-20 MED ORDER — FUROSEMIDE 10 MG/ML IJ SOLN
INTRAMUSCULAR | Status: AC
  Administered 2013-02-20: 80 mg via INTRAVENOUS
  Filled 2013-02-20: qty 8

## 2013-02-20 MED ORDER — DIGOXIN 250 MCG PO TABS
0.2500 mg | ORAL_TABLET | Freq: Every day | ORAL | Status: DC
  Administered 2013-02-20 – 2013-02-28 (×9): 0.25 mg via ORAL
  Filled 2013-02-20 (×9): qty 1

## 2013-02-20 MED ORDER — INSULIN ASPART 100 UNIT/ML ~~LOC~~ SOLN
8.0000 [IU] | Freq: Once | SUBCUTANEOUS | Status: AC
  Administered 2013-02-20: 8 [IU] via SUBCUTANEOUS

## 2013-02-20 MED ORDER — INSULIN ASPART 100 UNIT/ML ~~LOC~~ SOLN
0.0000 [IU] | Freq: Three times a day (TID) | SUBCUTANEOUS | Status: DC
  Administered 2013-02-20 (×2): 8 [IU] via SUBCUTANEOUS
  Administered 2013-02-21 (×2): 11 [IU] via SUBCUTANEOUS
  Administered 2013-02-21: 5 [IU] via SUBCUTANEOUS
  Administered 2013-02-22 (×2): 8 [IU] via SUBCUTANEOUS
  Administered 2013-02-22: 3 [IU] via SUBCUTANEOUS
  Administered 2013-02-23: 8 [IU] via SUBCUTANEOUS
  Administered 2013-02-23 – 2013-02-24 (×4): 3 [IU] via SUBCUTANEOUS
  Administered 2013-02-25: 2 [IU] via SUBCUTANEOUS
  Administered 2013-02-25 – 2013-02-26 (×2): 3 [IU] via SUBCUTANEOUS
  Administered 2013-02-27: 5 [IU] via SUBCUTANEOUS
  Administered 2013-02-27 – 2013-02-28 (×2): 3 [IU] via SUBCUTANEOUS

## 2013-02-20 MED ORDER — FUROSEMIDE 10 MG/ML IJ SOLN
80.0000 mg | Freq: Once | INTRAMUSCULAR | Status: AC
  Administered 2013-02-20: 80 mg via INTRAVENOUS

## 2013-02-20 NOTE — Progress Notes (Signed)
Occupational Therapy Discharge Patient Details Name: Sarah Flores MRN: [id number] DOB: [birth year] Today's Date: 02/20/2013 Time:  -     Patient discharged from OT services secondary to medical decline - will need to re-order OT to resume therapy services. Pending procedure 02/22/13  Please see latest therapy progress note for current level of functioning and progress toward goals.    OT TO SIGN OFF . PLEASE REORDER WHEN APPROPRIATE!  GO     Harolyn Rutherford Pager: [phone]  02/20/2013, 8:12 AM

## 2013-02-20 NOTE — Progress Notes (Signed)
PT Cancellation Note  Patient Details Name: Sarah Flores MRN: [id number] DOB: 09/13/1945   Cancelled Treatment:    Reason Eval/Treat Not Completed: Medical issues which prohibited therapy; patient moved to [facility] for cardiac condition, per MD note will have procedure on Monday. Will sign off with request for new orders when medically stable and appropriate for therapies.    Fabio Asa 02/20/2013, 8:07 AM

## 2013-02-20 NOTE — Progress Notes (Signed)
Patient ID: Sarah Flores, female   DOB: 1945/04/13, 67 y.o.   MRN: [id number] Doing well from neurosurgical stand point. Drain still in place. Will keep in until tomorrow.  No leg pain. Management per cardiology service.

## 2013-02-20 NOTE — Progress Notes (Signed)
Dr. Leeroy Bock (oncall cards fellow) notified of increasing HR. Pt ST with rate 120's. Pt denies chest discomfort at this time. No new orders noted by MD. Will monitor.

## 2013-02-20 NOTE — Progress Notes (Signed)
Pt c/o weakness, states that she is unable to lift her head off the pillow. Pt is diaphoretic and clammy. Pt states she has no pain and VSS on Levophed at . EKG no new changes noted. Pt requiring more oxygen to keep saturations >90%. MD called and made aware. No new orders received. MD stated he would be up to see pt at the bedside. Pt and husband at bedside updated. Will continue to monitor closely.

## 2013-02-20 NOTE — Progress Notes (Signed)
Subjective:  Appears improved. Dyspneic if she removes oxygen, but angina free and no other complaints. BP remains low, requiring pressor support, but CI improved to 2.4 Diastolic PA pressure around 25, not sure if PAWP of 13 is accurate Good UO, but net positive fluid balance Echo done late last night, will review ECG completely normal today  Objective:  Temp:  [97.3 F (36.3 C)-99 F (37.2 C)] 98.6 F (37 C) (12/20 0800) Pulse Rate:  [78-112] 101 (12/20 0800) Resp:  [13-34] 23 (12/20 0800) BP: (61-207)/(31-148) 82/54 mmHg (12/20 0800) SpO2:  [89 %-100 %] 95 % (12/20 0800) FiO2 (%):  [100 %] 100 % (12/19 2330) Weight:  [165 lb 5.5 oz (75 kg)] 165 lb 5.5 oz (75 kg) (12/19 1245) Weight change: 0 lb (0 kg)  Intake/Output from previous day: 12/19 0701 - 12/20 0700 In: 2157.9 [P.O.:510; I.V.:1647.9] Out: 3425 [Urine:3325; Drains:100]  Intake/Output from this shift:    Medications: Current Facility-Administered Medications  Medication Dose Route Frequency Provider Last Rate Last Dose  . 0.9 %  sodium chloride infusion   Intravenous PRN Glori Luis, MD 5 mL/hr at 02/20/13 0020    . 0.9 %  sodium chloride infusion   Intravenous PRN Glori Luis, MD 10 mL/hr at 02/19/13 2300    . 0.9 %  sodium chloride infusion   Intravenous PRN Glori Luis, MD      . acetaminophen (TYLENOL) tablet 650 mg  650 mg Oral Q4H PRN Tia Alert, MD       Or  . acetaminophen (TYLENOL) suppository 650 mg  650 mg Rectal Q4H PRN Tia Alert, MD      . atorvastatin (LIPITOR) tablet 80 mg  80 mg Oral q1800 [name], NP   80 mg at 02/19/13 1823  . celecoxib (CELEBREX) capsule 200 mg  200 mg Oral BID Tia Alert, MD   200 mg at 02/19/13 2134  . DULoxetine (CYMBALTA) DR capsule 60 mg  60 mg Oral Daily Tia Alert, MD   60 mg at 02/19/13 1013  . glipiZIDE (GLUCOTROL XL) 24 hr tablet 10 mg  10 mg Oral BID WC Tia Alert, MD   10 mg at 02/20/13 0847  . insulin aspart (novoLOG)  injection 0-15 Units  0-15 Units Subcutaneous TID WC Aliyanah Rozas, MD      . linagliptin (TRADJENTA) tablet 5 mg  5 mg Oral Daily Tia Alert, MD   5 mg at 02/19/13 1017  . menthol-cetylpyridinium (CEPACOL) lozenge 3 mg  1 lozenge Oral PRN Tia Alert, MD       Or  . phenol [facility]) mouth spray 1 spray  1 spray Mouth/Throat PRN Tia Alert, MD      . methocarbamol (ROBAXIN) tablet 500 mg  500 mg Oral Q6H PRN Tia Alert, MD   500 mg at 02/19/13 1941   Or  . methocarbamol (ROBAXIN) 500 mg in dextrose 5 % 50 mL IVPB  500 mg Intravenous Q6H PRN Tia Alert, MD   500 mg at 02/19/13 0849  . milrinone (PRIMACOR) infusion 200 mcg/mL (0.2 mg/ml)  0.375 mcg/kg/min Intravenous Continuous Dolores Patty, MD 8.4 mL/hr at 02/20/13 0020 0.375 mcg/kg/min at 02/20/13 0020  . morphine 2 MG/ML injection 1-4 mg  1-4 mg Intravenous Q3H PRN Tia Alert, MD   2 mg at 02/20/13 5784  . norepinephrine (LEVOPHED) 16 mg in dextrose 5 % 250 mL infusion  2-50 mcg/min Intravenous Titrated Onalee Hua  Barnett Applebaum, MD 9.4 mL/hr at 02/20/13 0230 10 mcg/min at 02/20/13 0230  . ondansetron (ZOFRAN) injection 4 mg  4 mg Intravenous Q4H PRN Tia Alert, MD   4 mg at 02/19/13 1135  . oxyCODONE-acetaminophen (PERCOCET/ROXICET) 5-325 MG per tablet 1-2 tablet  1-2 tablet Oral Q4H PRN Tia Alert, MD   2 tablet at 02/19/13 2130    Physical Exam: General appearance: alert, cooperative and no distress Neck: no adenopathy, no carotid bruit, no JVD, supple, symmetrical, trachea midline and thyroid not enlarged, symmetric, no tenderness/mass/nodules Lungs: clear to auscultation bilaterally Heart: regular rate and rhythm Abdomen: soft, non-tender; bowel sounds normal; no masses,  no organomegaly Extremities: extremities normal, atraumatic, no cyanosis or edema Pulses: 2+ and symmetric Skin: Skin color, texture, turgor normal. No rashes or lesions Neurologic: Alert and oriented X 3, normal strength and tone. Normal  symmetric reflexes. Normal coordination and gait  Lab Results: Results for orders placed during the hospital encounter of 02/18/13 (from the past 48 hour(s))  GLUCOSE, CAPILLARY     Status: None   Collection Time    02/18/13 12:17 PM      Result Value Range   Glucose-Capillary 78  70 - 99 mg/dL  GLUCOSE, CAPILLARY     Status: Abnormal   Collection Time    02/18/13  4:25 PM      Result Value Range   Glucose-Capillary 112 (*) 70 - 99 mg/dL  GLUCOSE, CAPILLARY     Status: Abnormal   Collection Time    02/19/13 11:30 AM      Result Value Range   Glucose-Capillary 360 (*) 70 - 99 mg/dL  TROPONIN I     Status: Abnormal   Collection Time    02/19/13 12:43 PM      Result Value Range   Troponin I 1.09 (*) <0.30 ng/mL   Comment:            Due to the release kinetics of cTnI,     a negative result within the first hours     of the onset of symptoms does not rule out     myocardial infarction with certainty.     If myocardial infarction is still suspected,     repeat the test at appropriate intervals.     CRITICAL RESULT CALLED TO, READ BACK BY AND VERIFIED WITH:     Stephania Fragmin 1547 02/19/13 D BRADLEY  CK TOTAL AND CKMB     Status: Abnormal   Collection Time    02/19/13 12:43 PM      Result Value Range   Total CK 222 (*) 7 - 177 U/L   CK, MB 8.2 (*) 0.3 - 4.0 ng/mL   Comment: CRITICAL RESULT CALLED TO, READ BACK BY AND VERIFIED WITH:     Stephania Fragmin 1547 02/19/13 D BRADLEY   Relative Index 3.7 (*) 0.0 - 2.5  MRSA PCR SCREENING     Status: None   Collection Time    02/19/13 12:46 PM      Result Value Range   MRSA by PCR NEGATIVE  NEGATIVE   Comment:            The GeneXpert MRSA Assay (FDA     approved for NASAL specimens     only), is one component of a     comprehensive MRSA colonization     surveillance program. It is not     intended to diagnose MRSA     infection nor to  guide or     monitor treatment for     MRSA infections.  CBC WITH DIFFERENTIAL     Status:  Abnormal   Collection Time    02/19/13  1:00 PM      Result Value Range   WBC 25.9 (*) 4.0 - 10.5 K/uL   RBC 3.46 (*) 3.87 - 5.11 MIL/uL   Hemoglobin 11.3 (*) 12.0 - 15.0 g/dL   HCT 40.9 (*) 81.1 - 91.4 %   MCV 95.1  78.0 - 100.0 fL   MCH 32.7  26.0 - 34.0 pg   MCHC 34.3  30.0 - 36.0 g/dL   RDW 78.2  95.6 - 21.3 %   Platelets 396  150 - 400 K/uL   Neutrophils Relative % 87 (*) 43 - 77 %   Lymphocytes Relative 8 (*) 12 - 46 %   Monocytes Relative 5  3 - 12 %   Eosinophils Relative 0  0 - 5 %   Basophils Relative 0  0 - 1 %   Neutro Abs 22.5 (*) 1.7 - 7.7 K/uL   Lymphs Abs 2.1  0.7 - 4.0 K/uL   Monocytes Absolute 1.3 (*) 0.1 - 1.0 K/uL   Eosinophils Absolute 0.0  0.0 - 0.7 K/uL   Basophils Absolute 0.0  0.0 - 0.1 K/uL   RBC Morphology POLYCHROMASIA PRESENT     WBC Morphology ATYPICAL LYMPHOCYTES     Comment: TOXIC GRANULATION   Smear Review LARGE PLATELETS PRESENT    COMPREHENSIVE METABOLIC PANEL     Status: Abnormal   Collection Time    02/19/13  1:00 PM      Result Value Range   Sodium 132 (*) 135 - 145 mEq/L   Potassium 4.3  3.5 - 5.1 mEq/L   Chloride 98  96 - 112 mEq/L   CO2 19  19 - 32 mEq/L   Glucose, Bld 270 (*) 70 - 99 mg/dL   BUN 18  6 - 23 mg/dL   Creatinine, Ser 0.86  0.50 - 1.10 mg/dL   Calcium 6.8 (*) 8.4 - 10.5 mg/dL   Total Protein 6.3  6.0 - 8.3 g/dL   Albumin 3.3 (*) 3.5 - 5.2 g/dL   AST 21  0 - 37 U/L   ALT 16  0 - 35 U/L   Alkaline Phosphatase 42  39 - 117 U/L   Total Bilirubin 0.6  0.3 - 1.2 mg/dL   GFR calc non Af Amer 56 (*) >90 mL/min   GFR calc Af Amer 65 (*) >90 mL/min   Comment: (NOTE)     The eGFR has been calculated using the CKD EPI equation.     This calculation has not been validated in all clinical situations.     eGFR's persistently <90 mL/min signify possible Chronic Kidney     Disease.  TSH     Status: None   Collection Time    02/19/13  1:00 PM      Result Value Range   TSH 1.370  0.350 - 4.500 uIU/mL   Comment: Performed at  [employer] [facility]  MAGNESIUM     Status: Abnormal   Collection Time    02/19/13  1:00 PM      Result Value Range   Magnesium 1.2 (*) 1.5 - 2.5 mg/dL  TROPONIN I     Status: Abnormal   Collection Time    02/19/13  8:32 PM      Result Value Range   Troponin I  2.56 (*) <0.30 ng/mL   Comment:            Due to the release kinetics of cTnI,     a negative result within the first hours     of the onset of symptoms does not rule out     myocardial infarction with certainty.     If myocardial infarction is still suspected,     repeat the test at appropriate intervals.     CRITICAL VALUE NOTED.  VALUE IS CONSISTENT WITH PREVIOUSLY REPORTED AND CALLED VALUE.  POCT I-STAT 3, BLOOD GAS (G3P V)     Status: Abnormal   Collection Time    02/19/13 11:11 PM      Result Value Range   pH, Ven 7.244 (*) 7.250 - 7.300   pCO2, Ven 40.5 (*) 45.0 - 50.0 mmHg   pO2, Ven 32.0  30.0 - 45.0 mmHg   Bicarbonate 17.5 (*) 20.0 - 24.0 mEq/L   TCO2 19  0 - 100 mmol/L   O2 Saturation 51.0     Acid-base deficit 9.0 (*) 0.0 - 2.0 mmol/L   Sample type VENOUS     Comment NOTIFIED PHYSICIAN    POCT I-STAT 3, BLOOD GAS (G3P V)     Status: Abnormal   Collection Time    02/20/13 12:23 AM      Result Value Range   pH, Ven 7.276  7.250 - 7.300   pCO2, Ven 40.7 (*) 45.0 - 50.0 mmHg   pO2, Ven 23.0 (*) 30.0 - 45.0 mmHg   Bicarbonate 19.0 (*) 20.0 - 24.0 mEq/L   TCO2 20  0 - 100 mmol/L   O2 Saturation 33.0     Acid-base deficit 7.0 (*) 0.0 - 2.0 mmol/L   Sample type VENOUS     Comment MD NOTIFIED, REPEAT TEST    GLUCOSE, CAPILLARY     Status: Abnormal   Collection Time    02/20/13 12:24 AM      Result Value Range   Glucose-Capillary 323 (*) 70 - 99 mg/dL  BASIC METABOLIC PANEL     Status: Abnormal   Collection Time    02/20/13 12:30 AM      Result Value Range   Sodium 131 (*) 135 - 145 mEq/L   Potassium 3.9  3.5 - 5.1 mEq/L   Chloride 96  96 - 112 mEq/L   CO2 20  19 - 32 mEq/L   Glucose, Bld 353 (*) 70  - 99 mg/dL   BUN 20  6 - 23 mg/dL   Creatinine, Ser 4.09  0.50 - 1.10 mg/dL   Calcium 6.6 (*) 8.4 - 10.5 mg/dL   GFR calc non Af Amer 61 (*) >90 mL/min   GFR calc Af Amer 70 (*) >90 mL/min   Comment: (NOTE)     The eGFR has been calculated using the CKD EPI equation.     This calculation has not been validated in all clinical situations.     eGFR's persistently <90 mL/min signify possible Chronic Kidney     Disease.  CARBOXYHEMOGLOBIN     Status: None   Collection Time    02/20/13 12:30 AM      Result Value Range   Total hemoglobin PENDING  12.0 - 16.0 g/dL   O2 Saturation 81.1     Carboxyhemoglobin PENDING  0.5 - 1.5 %   Methemoglobin PENDING  0.0 - 1.5 %   Total oxygen content PENDING  15.0 - 23.0 mL/dL  TROPONIN I  Status: Abnormal   Collection Time    02/20/13 12:33 AM      Result Value Range   Troponin I 3.36 (*) <0.30 ng/mL   Comment:            Due to the release kinetics of cTnI,     a negative result within the first hours     of the onset of symptoms does not rule out     myocardial infarction with certainty.     If myocardial infarction is still suspected,     repeat the test at appropriate intervals.     CRITICAL VALUE NOTED.  VALUE IS CONSISTENT WITH PREVIOUSLY REPORTED AND CALLED VALUE.  BASIC METABOLIC PANEL     Status: Abnormal   Collection Time    02/20/13  4:30 AM      Result Value Range   Sodium 133 (*) 135 - 145 mEq/L   Potassium 3.8  3.5 - 5.1 mEq/L   Chloride 96  96 - 112 mEq/L   CO2 23  19 - 32 mEq/L   Glucose, Bld 285 (*) 70 - 99 mg/dL   BUN 18  6 - 23 mg/dL   Creatinine, Ser 1.47  0.50 - 1.10 mg/dL   Calcium 7.3 (*) 8.4 - 10.5 mg/dL   GFR calc non Af Amer 54 (*) >90 mL/min   GFR calc Af Amer 62 (*) >90 mL/min   Comment: (NOTE)     The eGFR has been calculated using the CKD EPI equation.     This calculation has not been validated in all clinical situations.     eGFR's persistently <90 mL/min signify possible Chronic Kidney     Disease.    PRO B NATRIURETIC PEPTIDE     Status: Abnormal   Collection Time    02/20/13  4:30 AM      Result Value Range   Pro B Natriuretic peptide (BNP) 12431.0 (*) 0 - 125 pg/mL  CBC     Status: Abnormal   Collection Time    02/20/13  4:30 AM      Result Value Range   WBC 27.8 (*) 4.0 - 10.5 K/uL   RBC 3.94  3.87 - 5.11 MIL/uL   Hemoglobin 13.1  12.0 - 15.0 g/dL   HCT 82.9  56.2 - 13.0 %   MCV 94.9  78.0 - 100.0 fL   MCH 33.2  26.0 - 34.0 pg   MCHC 35.0  30.0 - 36.0 g/dL   RDW 86.5  78.4 - 69.6 %   Platelets 388  150 - 400 K/uL  CK TOTAL AND CKMB     Status: Abnormal   Collection Time    02/20/13  4:30 AM      Result Value Range   Total CK 263 (*) 7 - 177 U/L   CK, MB 13.6 (*) 0.3 - 4.0 ng/mL   Comment: CRITICAL VALUE NOTED.  VALUE IS CONSISTENT WITH PREVIOUSLY REPORTED AND CALLED VALUE.   Relative Index 5.2 (*) 0.0 - 2.5  TROPONIN I     Status: Abnormal   Collection Time    02/20/13  4:30 AM      Result Value Range   Troponin I 2.27 (*) <0.30 ng/mL   Comment:            Due to the release kinetics of cTnI,     a negative result within the first hours     of the onset of symptoms does not rule out  myocardial infarction with certainty.     If myocardial infarction is still suspected,     repeat the test at appropriate intervals.     CRITICAL VALUE NOTED.  VALUE IS CONSISTENT WITH PREVIOUSLY REPORTED AND CALLED VALUE.  CARBOXYHEMOGLOBIN     Status: Abnormal   Collection Time    02/20/13  5:27 AM      Result Value Range   Total hemoglobin 11.7 (*) 12.0 - 16.0 g/dL   O2 Saturation 40.9     Carboxyhemoglobin 1.6 (*) 0.5 - 1.5 %   Methemoglobin 1.6 (*) 0.0 - 1.5 %  GLUCOSE, CAPILLARY     Status: Abnormal   Collection Time    02/20/13  8:04 AM      Result Value Range   Glucose-Capillary 278 (*) 70 - 99 mg/dL    Imaging: Dg Lumbar Spine 2-3 Views  02/18/2013   CLINICAL DATA:  L5-S1 PLIF  EXAM: LUMBAR SPINE - 2-3 VIEW; DG C-ARM 61-120 MIN  COMPARISON:  Lumbar myelogram  01/06/2013  FINDINGS: Bilateral pedicle screw and interbody fusion L5-S1 in satisfactory position. Normal alignment of the lumbosacral junction. No acute complication.  IMPRESSION: Satisfactory PLIF L5-S1.   Electronically Signed   By: Marlan Palau M.D.   On: 02/18/2013 15:55   Dg Chest Port 1 View  02/20/2013   CLINICAL DATA:  Assess PA line position  EXAM: PORTABLE CHEST - 1 VIEW  COMPARISON:  02/10/2013  FINDINGS: No cardiomegaly. Upper mediastinal contours widened in the supine position. Right IJ approach Swan-Ganz catheter, tip in the region of the inner lobar pulmonary artery. Diffuse interstitial prominence. Small left effusion and asymmetric left base opacity. No pneumothorax.  IMPRESSION: 1. Swan-Ganz catheter tip in the region of the interlobar pulmonary artery. 2. Pulmonary edema and left pleural effusion. Asymmetric left lower opacity could represent pneumonia.   Electronically Signed   By: Tiburcio Pea M.D.   On: 02/20/2013 01:57   Dg C-arm 61-120 Min  02/18/2013   CLINICAL DATA:  L5-S1 PLIF  EXAM: LUMBAR SPINE - 2-3 VIEW; DG C-ARM 61-120 MIN  COMPARISON:  Lumbar myelogram 01/06/2013  FINDINGS: Bilateral pedicle screw and interbody fusion L5-S1 in satisfactory position. Normal alignment of the lumbosacral junction. No acute complication.  IMPRESSION: Satisfactory PLIF L5-S1.   Electronically Signed   By: Marlan Palau M.D.   On: 02/18/2013 15:55    Assessment:  1. Principal Problem: 2.   ST elevation myocardial infarction (STEMI) of lateral wall 3. Active Problems: 4.   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   HTN (hypertension) 6.   Dyslipidemia, goal LDL below 70 7.   Diabetes 8.   Hypotension due to drugs 9.   Cardiogenic shock 10.   Plan: Remains critically ill, but improving 1. Cardiogenic shock s/p STEMI with spontaneous reperfusion. Suspect transient total occlusion of the LAD and/or left circumflex artery with small infarction, but extensive stunning. Possibly acute  MR. I wonder whether one major epicardial artery was chronically occluded and supplied via collaterals from the other. This could explain the wide distribution of wall motuion abnormalities and the severe hemodynamic consequences. 2. Resume heparin without bolus tomorrow, monitoring closely for neuro side effects. Cath and PCI Monday. 3. No need for IABP right now, but not ready to wean pressors just yet  Time Spent Directly with Patient:  60 minutes  Length of Stay:  LOS: 2 days    Darius Lundberg 02/20/2013, 9:02 AM

## 2013-02-20 NOTE — Progress Notes (Signed)
Husband was called and updated on pt condition.

## 2013-02-20 NOTE — Progress Notes (Signed)
Pt had 9 beat run of wide QRS ?VT. Dr. Estrella Myrtle notified. Pt asleep and without symptoms.

## 2013-02-20 NOTE — Progress Notes (Signed)
CBG 281. No HS insulin coverage ordered. Notified MD on call. No new orders. Will monitor.

## 2013-02-20 NOTE — Progress Notes (Signed)
   Recent PA catheter numbers reviewed.  CI 3.3. SVR 667. PCWP 12.  Will decrease milrinone to 0.25. Can wean further as tolerated. Lasix remains on hold. Digoxin started.   Jakerra Floyd,MD 5:25 PM

## 2013-02-20 NOTE — Progress Notes (Addendum)
  Follow-up note.  Patient somewhat improved on switch from norepi to milrinone. Cardiac output still depressed and milrinone titrated to 0.375. Urine output improved. Creatinine stable.  Interestingly troponin only 3.5 in setting of severe LV dysfunction on echo and may suggest that even if she occluded a major epicardial vessel she likely has a decent enough collateral network to prevent a major infarct and result more in a stunned myocardium picture. Alternatively, echo could be c/w Tako-Tsubo. Thus I would support taking her to the cath lab on Monday to evaluate and if high-grade CAD attempting PCI even in the event of a totally occluded vessel.   Redonna Wilbert,MD 1:46 AM

## 2013-02-21 DIAGNOSIS — E119 Type 2 diabetes mellitus without complications: Secondary | ICD-10-CM

## 2013-02-21 LAB — URINALYSIS, ROUTINE W REFLEX MICROSCOPIC
Ketones, ur: >80 mg/dL — AB
Leukocytes, UA: NEGATIVE
Nitrite: NEGATIVE
Urobilinogen, UA: 0.2 mg/dL (ref 0.0–1.0)

## 2013-02-21 LAB — URINE MICROSCOPIC-ADD ON

## 2013-02-21 MED ORDER — VANCOMYCIN HCL 10 G IV SOLR
1250.0000 mg | INTRAVENOUS | Status: AC
  Administered 2013-02-21 – 2013-02-26 (×6): 1250 mg via INTRAVENOUS
  Filled 2013-02-21 (×6): qty 1250

## 2013-02-21 MED ORDER — SODIUM CHLORIDE 0.9 % IV SOLN: INTRAVENOUS | Status: AC | PRN

## 2013-02-21 MED ORDER — INSULIN GLARGINE 100 UNIT/ML ~~LOC~~ SOLN
15.0000 [IU] | Freq: Every morning | SUBCUTANEOUS | Status: DC
  Administered 2013-02-21: 15 [IU] via SUBCUTANEOUS
  Filled 2013-02-21 (×3): qty 0.15

## 2013-02-21 MED ORDER — SODIUM CHLORIDE 0.9 % IV BOLUS (SEPSIS)
250.0000 mL | Freq: Once | INTRAVENOUS | Status: AC
  Administered 2013-02-21: 250 mL via INTRAVENOUS

## 2013-02-21 MED ORDER — MAGNESIUM SULFATE 40 MG/ML IJ SOLN
2.0000 g | Freq: Once | INTRAMUSCULAR | Status: AC
  Administered 2013-02-21: 2 g via INTRAVENOUS
  Filled 2013-02-21: qty 50

## 2013-02-21 MED ORDER — LORAZEPAM 2 MG/ML IJ SOLN
INTRAMUSCULAR | Status: AC
  Filled 2013-02-21: qty 1

## 2013-02-21 MED ORDER — SODIUM CHLORIDE 0.9 % IV SOLN
INTRAVENOUS | Status: AC
  Administered 2013-02-21: 18:00:00 via INTRAVENOUS

## 2013-02-21 MED ORDER — LORAZEPAM 2 MG/ML IJ SOLN
0.5000 mg | Freq: Once | INTRAMUSCULAR | Status: AC
  Administered 2013-02-21: 0.5 mg via INTRAVENOUS

## 2013-02-21 NOTE — Progress Notes (Signed)
ANTIBIOTIC CONSULT NOTE - INITIAL  Pharmacy Consult for Vancomycin Indication: spinal surgery with drain in place  Allergies  Allergen Reactions  . Penicillins Anaphylaxis    Patient Measurements: Height: 5\' 3"  (160 cm) Weight: 165 lb 5.5 oz (75 kg) IBW/kg (Calculated) : 52.4  Vital Signs: Temp: 99.3 F (37.4 C) (12/21 0900) Temp src: Other (Comment) (12/21 0800) BP: 105/70 mmHg (12/21 0900) Pulse Rate: 126 (12/21 0900) Intake/Output from previous day: 12/20 0701 - 12/21 0700 In: 1397.9 [P.O.:600; I.V.:697.9; IV Piggyback:100] Out: 3035 [Urine:3000; Drains:35] Intake/Output from this shift: Total I/O In: 113.1 [I.V.:113.1] Out: 325 [Urine:325]  Labs:  Recent Labs  02/19/13 1300 02/20/13 0030 02/20/13 0430  WBC 25.9*  --  27.8*  HGB 11.3*  --  13.1  PLT 396  --  388  CREATININE 1.02 0.96 1.06   Estimated Creatinine Clearance: 51.3 ml/min (by C-G formula based on Cr of 1.06). No results found for this basename: VANCOTROUGH, Leodis Binet, VANCORANDOM, GENTTROUGH, GENTPEAK, GENTRANDOM, TOBRATROUGH, TOBRAPEAK, TOBRARND, AMIKACINPEAK, AMIKACINTROU, AMIKACIN,  in the last 72 hours   Microbiology: Recent Results (from the past 720 hour(s))  SURGICAL PCR SCREEN     Status: Abnormal   Collection Time    02/10/13  8:43 AM      Result Value Range Status   MRSA, PCR NEGATIVE  NEGATIVE Final   Staphylococcus aureus POSITIVE (*) NEGATIVE Final   Comment:            The Xpert SA Assay (FDA     approved for NASAL specimens     in patients over 46 years of age),     is one component of     a comprehensive surveillance     program.  Test performance has     been validated by [employer] for patients greater     than or equal to 51 year old.     It is not intended     to diagnose infection nor to     guide or monitor treatment.  MRSA PCR SCREENING     Status: None   Collection Time    02/19/13 12:46 PM      Result Value Range Status   MRSA by PCR NEGATIVE  NEGATIVE  Final   Comment:            The GeneXpert MRSA Assay (FDA     approved for NASAL specimens     only), is one component of a     comprehensive MRSA colonization     surveillance program. It is not     intended to diagnose MRSA     infection nor to guide or     monitor treatment for     MRSA infections.    Medical History: Past Medical History  Diagnosis Date  . 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 PONV (postoperative nausea and vomiting)   . Hypertension   . Chronic kidney disease     abnormal labs  . Fibromyalgia   . Family history of anesthesia complication     "son goes crazy; hits people" (02/18/2013)  . 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 Pneumonia 1950; ~ 1960; ~ 1967  . Chronic bronchitis     "usually get it q other year" (02/18/2013)  . Type II diabetes mellitus   .  Arthritis     "all my joints" (02/18/2013)  . ST elevation myocardial infarction (STEMI) of lateral wall 02/19/2013  . HTN (hypertension) 02/19/2013  . Dyslipidemia, goal LDL below 70 02/19/2013  . Diabetes 02/19/2013    Assessment: 67 year old female s/p spinal surgery 12/18 with new lateral MI on 12/19.  To resume vancomycin with drain in place.  Goal of Therapy:  Vancomycin trough level 10-15 mcg/ml  Plan:  1) Vancomycin 1250 mg iv Q 24 hours 2) Continue to follow progress, Scr, and fever trend.  Thank you. Okey Regal, PharmD [phone]  02/21/2013,11:35 AM

## 2013-02-21 NOTE — Progress Notes (Signed)
Patient ID: Sarah Flores, female   DOB: 1945/12/10, 67 y.o.   MRN: [id number] Afeb, vitals fairly stable. Wound fine, drain removed. Will increase activity once ok with heart service.

## 2013-02-21 NOTE — Progress Notes (Signed)
Paged Dr. Salena Saner re: tachycardia. New order received for NS280ml/hr for 5 hours. Will continue to monitor.

## 2013-02-21 NOTE — Progress Notes (Signed)
Subjective:  Overall feels a little better, but tired and anxious. Had one 30 minute episode of mild chest pressure associated with tachycardia, resolved spontaneously.  No dyspnea at rest, low O2 requirements. Lying fully supine without distress. Gradually worsening sinus tachycardia.  Excellent UO (received IV furosemide last night) and net negative fluid balance by 1.5L/24h  Hemodynamics show low SVR and low filling pressures with excellent cardiac output, while on milrinone 0.25 mcg/kg/minand norepi 12 mcg/kg/min  PAP 20/17 (18) CVP 6 PAWP 13 CI 3.2 SVR 813/SVRI 1414 PVR 73  /PVRI 126  Objective:  Temp:  [97.9 F (36.6 C)-99.1 F (37.3 C)] 99.1 F (37.3 C) (12/21 0800) Pulse Rate:  [102-134] 134 (12/21 0800) Resp:  [15-30] 22 (12/21 0800) BP: (79-109)/(42-71) 91/57 mmHg (12/21 0800) SpO2:  [97 %-100 %] 99 % (12/21 0800) Weight change:   Intake/Output from previous day: 12/20 0701 - 12/21 0700 In: 1397.9 [P.O.:600; I.V.:697.9; IV Piggyback:100] Out: 3035 [Urine:3000; Drains:35]  Intake/Output from this shift: Total I/O In: 64.3 [I.V.:64.3] Out: 200 [Urine:200]  Medications: Current Facility-Administered Medications  Medication Dose Route Frequency Provider Last Rate Last Dose  . 0.9 %  sodium chloride infusion   Intravenous PRN Glori Luis, MD 15 mL/hr at 02/20/13 2100    . 0.9 %  sodium chloride infusion   Intravenous PRN Glori Luis, MD      . 0.9 %  sodium chloride infusion   Intravenous PRN Thurmon Fair, MD 150 mL/hr at 02/21/13 0845    . acetaminophen (TYLENOL) tablet 650 mg  650 mg Oral Q4H PRN Tia Alert, MD       Or  . acetaminophen (TYLENOL) suppository 650 mg  650 mg Rectal Q4H PRN Tia Alert, MD      . atorvastatin (LIPITOR) tablet 80 mg  80 mg Oral q1800 [name], NP   80 mg at 02/20/13 1815  . digoxin (LANOXIN) tablet 0.25 mg  0.25 mg Oral Daily Dolores Patty, MD   0.25 mg at 02/20/13 1113  . DULoxetine (CYMBALTA) DR  capsule 60 mg  60 mg Oral Daily Tia Alert, MD   60 mg at 02/20/13 1111  . glipiZIDE (GLUCOTROL XL) 24 hr tablet 10 mg  10 mg Oral BID WC Tia Alert, MD   10 mg at 02/20/13 1815  . insulin aspart (novoLOG) injection 0-15 Units  0-15 Units Subcutaneous TID WC Thurmon Fair, MD   8 Units at 02/20/13 1814  . insulin glargine (LANTUS) injection 15 Units  15 Units Subcutaneous q morning - 10a Zion Lint, MD      . linagliptin (TRADJENTA) tablet 5 mg  5 mg Oral Daily Tia Alert, MD   5 mg at 02/20/13 1113  . magnesium sulfate IVPB 2 g 50 mL  2 g Intravenous Once Letishia Elliott, MD      . menthol-cetylpyridinium (CEPACOL) lozenge 3 mg  1 lozenge Oral PRN Tia Alert, MD       Or  . phenol (CHLORASEPTIC) mouth spray 1 spray  1 spray Mouth/Throat PRN Tia Alert, MD      . methocarbamol (ROBAXIN) tablet 500 mg  500 mg Oral Q6H PRN Tia Alert, MD   500 mg at 02/21/13 0539   Or  . methocarbamol (ROBAXIN) 500 mg in dextrose 5 % 50 mL IVPB  500 mg Intravenous Q6H PRN Tia Alert, MD   500 mg at 02/19/13 0849  . morphine 2 MG/ML injection 1-4 mg  1-4 mg Intravenous Q3H PRN Tia Alert, MD   2 mg at 02/20/13 1130  . norepinephrine (LEVOPHED) 16 mg in dextrose 5 % 250 mL infusion  2-50 mcg/min Intravenous Titrated Tia Alert, MD 11.3 mL/hr at 02/21/13 0246 12 mcg/min at 02/21/13 0246  . ondansetron (ZOFRAN) injection 4 mg  4 mg Intravenous Q4H PRN Tia Alert, MD   4 mg at 02/19/13 1135  . oxyCODONE-acetaminophen (PERCOCET/ROXICET) 5-325 MG per tablet 1-2 tablet  1-2 tablet Oral Q4H PRN Tia Alert, MD   1 tablet at 02/20/13 2307  . spironolactone (ALDACTONE) tablet 12.5 mg  12.5 mg Oral Daily Dolores Patty, MD   12.5 mg at 02/20/13 1111    Physical Exam: General appearance: alert, cooperative and no distress Neck: no adenopathy, no carotid bruit, no JVD, supple, symmetrical, trachea midline and thyroid not enlarged, symmetric, no tenderness/mass/nodules Lungs: clear to  auscultation bilaterally Heart: regular rate and rhythm, S1, S2 normal and S3 present Abdomen: soft, non-tender; bowel sounds normal; no masses,  no organomegaly Extremities: extremities normal, atraumatic, no cyanosis or edema Pulses: 2+ and symmetric ALLEN'S TEST in right wrist is unfavorable Skin: Skin color, texture, turgor normal. No rashes or lesions Neurologic: Grossly normal  Lab Results: Results for orders placed during the hospital encounter of 02/18/13 (from the past 48 hour(s))  GLUCOSE, CAPILLARY     Status: Abnormal   Collection Time    02/19/13 11:30 AM      Result Value Range   Glucose-Capillary 360 (*) 70 - 99 mg/dL  TROPONIN I     Status: Abnormal   Collection Time    02/19/13 12:43 PM      Result Value Range   Troponin I 1.09 (*) <0.30 ng/mL   Comment:            Due to the release kinetics of cTnI,     a negative result within the first hours     of the onset of symptoms does not rule out     myocardial infarction with certainty.     If myocardial infarction is still suspected,     repeat the test at appropriate intervals.     CRITICAL RESULT CALLED TO, READ BACK BY AND VERIFIED WITH:     Stephania Fragmin 1547 02/19/13 D BRAD[place]  CK TOTAL AND CKMB     Status: Abnormal   Collection Time    02/19/13 12:43 PM      Result Value Range   Total CK 222 (*) 7 - 177 U/L   CK, MB 8.2 (*) 0.3 - 4.0 ng/mL   Comment: CRITICAL RESULT CALLED TO, READ BACK BY AND VERIFIED WITH:     Stephania Fragmin 1547 02/19/13 D BRADLEY   Relative Index 3.7 (*) 0.0 - 2.5  MRSA PCR SCREENING     Status: None   Collection Time    02/19/13 12:46 PM      Result Value Range   MRSA by PCR NEGATIVE  NEGATIVE   Comment:            The GeneXpert MRSA Assay (FDA     approved for NASAL specimens     only), is one component of a     comprehensive MRSA colonization     surveillance program. It is not     intended to diagnose MRSA     infection nor to guide or     monitor treatment for     MRSA  infections.  CBC WITH DIFFERENTIAL     Status: Abnormal   Collection Time    02/19/13  1:00 PM      Result Value Range   WBC 25.9 (*) 4.0 - 10.5 K/uL   RBC 3.46 (*) 3.87 - 5.11 MIL/uL   Hemoglobin 11.3 (*) 12.0 - 15.0 g/dL   HCT 46.9 (*) 62.9 - 52.8 %   MCV 95.1  78.0 - 100.0 fL   MCH 32.7  26.0 - 34.0 pg   MCHC 34.3  30.0 - 36.0 g/dL   RDW 41.3  24.4 - 01.0 %   Platelets 396  150 - 400 K/uL   Neutrophils Relative % 87 (*) 43 - 77 %   Lymphocytes Relative 8 (*) 12 - 46 %   Monocytes Relative 5  3 - 12 %   Eosinophils Relative 0  0 - 5 %   Basophils Relative 0  0 - 1 %   Neutro Abs 22.5 (*) 1.7 - 7.7 K/uL   Lymphs Abs 2.1  0.7 - 4.0 K/uL   Monocytes Absolute 1.3 (*) 0.1 - 1.0 K/uL   Eosinophils Absolute 0.0  0.0 - 0.7 K/uL   Basophils Absolute 0.0  0.0 - 0.1 K/uL   RBC Morphology POLYCHROMASIA PRESENT     WBC Morphology ATYPICAL LYMPHOCYTES     Comment: TOXIC GRANULATION   Smear Review LARGE PLATELETS PRESENT    COMPREHENSIVE METABOLIC PANEL     Status: Abnormal   Collection Time    02/19/13  1:00 PM      Result Value Range   Sodium 132 (*) 135 - 145 mEq/L   Potassium 4.3  3.5 - 5.1 mEq/L   Chloride 98  96 - 112 mEq/L   CO2 19  19 - 32 mEq/L   Glucose, Bld 270 (*) 70 - 99 mg/dL   BUN 18  6 - 23 mg/dL   Creatinine, Ser 2.72  0.50 - 1.10 mg/dL   Calcium 6.8 (*) 8.4 - 10.5 mg/dL   Total Protein 6.3  6.0 - 8.3 g/dL   Albumin 3.3 (*) 3.5 - 5.2 g/dL   AST 21  0 - 37 U/L   ALT 16  0 - 35 U/L   Alkaline Phosphatase 42  39 - 117 U/L   Total Bilirubin 0.6  0.3 - 1.2 mg/dL   GFR calc non Af Amer 56 (*) >90 mL/min   GFR calc Af Amer 65 (*) >90 mL/min   Comment: (NOTE)     The eGFR has been calculated using the CKD EPI equation.     This calculation has not been validated in all clinical situations.     eGFR's persistently <90 mL/min signify possible Chronic Kidney     Disease.  TSH     Status: None   Collection Time    02/19/13  1:00 PM      Result Value Range   TSH 1.370   0.350 - 4.500 uIU/mL   Comment: Performed at [facility]  MAGNESIUM     Status: Abnormal   Collection Time    02/19/13  1:00 PM      Result Value Range   Magnesium 1.2 (*) 1.5 - 2.5 mg/dL  TROPONIN I     Status: Abnormal   Collection Time    02/19/13  8:32 PM      Result Value Range   Troponin I 2.56 (*) <0.30 ng/mL   Comment:  Due to the release kinetics of cTnI,     a negative result within the first hours     of the onset of symptoms does not rule out     myocardial infarction with certainty.     If myocardial infarction is still suspected,     repeat the test at appropriate intervals.     CRITICAL VALUE NOTED.  VALUE IS CONSISTENT WITH PREVIOUSLY REPORTED AND CALLED VALUE.  POCT I-STAT 3, BLOOD GAS (G3P V)     Status: Abnormal   Collection Time    02/19/13 11:11 PM      Result Value Range   pH, Ven 7.244 (*) 7.250 - 7.300   pCO2, Ven 40.5 (*) 45.0 - 50.0 mmHg   pO2, Ven 32.0  30.0 - 45.0 mmHg   Bicarbonate 17.5 (*) 20.0 - 24.0 mEq/L   TCO2 19  0 - 100 mmol/L   O2 Saturation 51.0     Acid-base deficit 9.0 (*) 0.0 - 2.0 mmol/L   Sample type VENOUS     Comment NOTIFIED PHYSICIAN    POCT I-STAT 3, BLOOD GAS (G3P V)     Status: Abnormal   Collection Time    02/20/13 12:23 AM      Result Value Range   pH, Ven 7.276  7.250 - 7.300   pCO2, Ven 40.7 (*) 45.0 - 50.0 mmHg   pO2, Ven 23.0 (*) 30.0 - 45.0 mmHg   Bicarbonate 19.0 (*) 20.0 - 24.0 mEq/L   TCO2 20  0 - 100 mmol/L   O2 Saturation 33.0     Acid-base deficit 7.0 (*) 0.0 - 2.0 mmol/L   Sample type VENOUS     Comment MD NOTIFIED, REPEAT TEST    GLUCOSE, CAPILLARY     Status: Abnormal   Collection Time    02/20/13 12:24 AM      Result Value Range   Glucose-Capillary 323 (*) 70 - 99 mg/dL  BASIC METABOLIC PANEL     Status: Abnormal   Collection Time    02/20/13 12:30 AM      Result Value Range   Sodium 131 (*) 135 - 145 mEq/L   Potassium 3.9  3.5 - 5.1 mEq/L   Chloride 96  96 - 112 mEq/L   CO2  20  19 - 32 mEq/L   Glucose, Bld 353 (*) 70 - 99 mg/dL   BUN 20  6 - 23 mg/dL   Creatinine, Ser 1.61  0.50 - 1.10 mg/dL   Calcium 6.6 (*) 8.4 - 10.5 mg/dL   GFR calc non Af Amer 61 (*) >90 mL/min   GFR calc Af Amer 70 (*) >90 mL/min   Comment: (NOTE)     The eGFR has been calculated using the CKD EPI equation.     This calculation has not been validated in all clinical situations.     eGFR's persistently <90 mL/min signify possible Chronic Kidney     Disease.  CARBOXYHEMOGLOBIN     Status: None   Collection Time    02/20/13 12:30 AM      Result Value Range   Total hemoglobin PENDING  12.0 - 16.0 g/dL   O2 Saturation 09.6     Carboxyhemoglobin PENDING  0.5 - 1.5 %   Methemoglobin PENDING  0.0 - 1.5 %   Total oxygen content PENDING  15.0 - 23.0 mL/dL  TROPONIN I     Status: Abnormal   Collection Time    02/20/13 12:33 AM  Result Value Range   Troponin I 3.36 (*) <0.30 ng/mL   Comment:            Due to the release kinetics of cTnI,     a negative result within the first hours     of the onset of symptoms does not rule out     myocardial infarction with certainty.     If myocardial infarction is still suspected,     repeat the test at appropriate intervals.     CRITICAL VALUE NOTED.  VALUE IS CONSISTENT WITH PREVIOUSLY REPORTED AND CALLED VALUE.  BASIC METABOLIC PANEL     Status: Abnormal   Collection Time    02/20/13  4:30 AM      Result Value Range   Sodium 133 (*) 135 - 145 mEq/L   Potassium 3.8  3.5 - 5.1 mEq/L   Chloride 96  96 - 112 mEq/L   CO2 23  19 - 32 mEq/L   Glucose, Bld 285 (*) 70 - 99 mg/dL   BUN 18  6 - 23 mg/dL   Creatinine, Ser 1.61  0.50 - 1.10 mg/dL   Calcium 7.3 (*) 8.4 - 10.5 mg/dL   GFR calc non Af Amer 54 (*) >90 mL/min   GFR calc Af Amer 62 (*) >90 mL/min   Comment: (NOTE)     The eGFR has been calculated using the CKD EPI equation.     This calculation has not been validated in all clinical situations.     eGFR's persistently <90 mL/min  signify possible Chronic Kidney     Disease.  PRO B NATRIURETIC PEPTIDE     Status: Abnormal   Collection Time    02/20/13  4:30 AM      Result Value Range   Pro B Natriuretic peptide (BNP) 12431.0 (*) 0 - 125 pg/mL  CBC     Status: Abnormal   Collection Time    02/20/13  4:30 AM      Result Value Range   WBC 27.8 (*) 4.0 - 10.5 K/uL   RBC 3.94  3.87 - 5.11 MIL/uL   Hemoglobin 13.1  12.0 - 15.0 g/dL   HCT 09.6  04.5 - 40.9 %   MCV 94.9  78.0 - 100.0 fL   MCH 33.2  26.0 - 34.0 pg   MCHC 35.0  30.0 - 36.0 g/dL   RDW 81.1  91.4 - 78.2 %   Platelets 388  150 - 400 K/uL  CK TOTAL AND CKMB     Status: Abnormal   Collection Time    02/20/13  4:30 AM      Result Value Range   Total CK 263 (*) 7 - 177 U/L   CK, MB 13.6 (*) 0.3 - 4.0 ng/mL   Comment: CRITICAL VALUE NOTED.  VALUE IS CONSISTENT WITH PREVIOUSLY REPORTED AND CALLED VALUE.   Relative Index 5.2 (*) 0.0 - 2.5  TROPONIN I     Status: Abnormal   Collection Time    02/20/13  4:30 AM      Result Value Range   Troponin I 2.27 (*) <0.30 ng/mL   Comment:            Due to the release kinetics of cTnI,     a negative result within the first hours     of the onset of symptoms does not rule out     myocardial infarction with certainty.     If myocardial infarction is still suspected,  repeat the test at appropriate intervals.     CRITICAL VALUE NOTED.  VALUE IS CONSISTENT WITH PREVIOUSLY REPORTED AND CALLED VALUE.  CARBOXYHEMOGLOBIN     Status: Abnormal   Collection Time    02/20/13  5:27 AM      Result Value Range   Total hemoglobin 11.7 (*) 12.0 - 16.0 g/dL   O2 Saturation 41.3     Carboxyhemoglobin 1.6 (*) 0.5 - 1.5 %   Methemoglobin 1.6 (*) 0.0 - 1.5 %  GLUCOSE, CAPILLARY     Status: Abnormal   Collection Time    02/20/13  8:04 AM      Result Value Range   Glucose-Capillary 278 (*) 70 - 99 mg/dL  GLUCOSE, CAPILLARY     Status: Abnormal   Collection Time    02/20/13 11:38 AM      Result Value Range    Glucose-Capillary 293 (*) 70 - 99 mg/dL  GLUCOSE, CAPILLARY     Status: Abnormal   Collection Time    02/20/13  5:22 PM      Result Value Range   Glucose-Capillary 257 (*) 70 - 99 mg/dL  GLUCOSE, CAPILLARY     Status: Abnormal   Collection Time    02/20/13  9:52 PM      Result Value Range   Glucose-Capillary 281 (*) 70 - 99 mg/dL  MAGNESIUM     Status: None   Collection Time    02/21/13  4:29 AM      Result Value Range   Magnesium 1.6  1.5 - 2.5 mg/dL  GLUCOSE, CAPILLARY     Status: Abnormal   Collection Time    02/21/13  8:03 AM      Result Value Range   Glucose-Capillary 324 (*) 70 - 99 mg/dL    Imaging: Imaging results have been reviewed and Dg Chest Port 1 View  02/20/2013   CLINICAL DATA:  Assess PA line position  EXAM: PORTABLE CHEST - 1 VIEW  COMPARISON:  02/10/2013  FINDINGS: No cardiomegaly. Upper mediastinal contours widened in the supine position. Right IJ approach Swan-Ganz catheter, tip in the region of the inner lobar pulmonary artery. Diffuse interstitial prominence. Small left effusion and asymmetric left base opacity. No pneumothorax.  IMPRESSION: 1. Swan-Ganz catheter tip in the region of the interlobar pulmonary artery. 2. Pulmonary edema and left pleural effusion. Asymmetric left lower opacity could represent pneumonia.   Electronically Signed   By: Tiburcio Pea M.D.   On: 02/20/2013 01:57    Assessment:  1. Principal Problem: 2.   ST elevation myocardial infarction (STEMI) of lateral wall 3. Active Problems: 4.   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   HTN (hypertension) 6.   Dyslipidemia, goal LDL below 70 7.   Diabetes 8.   Hypotension due to drugs 9.   Cardiogenic shock 10.   Plan:  1. Probable LCX STEMI with spontaneous reperfusion with multi vessel CAD, with post infarction angina and acute heart failure and cardiogenic shock, improving hemodynamics 2. Less likely, but an intriguing possibility, that she has Takotsubo sd. - this could explain  the unusual wall motion abnormality distribution 3. Severe hyperglycemia with glucosuria and obligatory diuresis - secondary to hyperadrenergic state and steroids.   Left heart catheterization and possible PCI tomorrow I suspect that we will find that LV function has improved substantially already Since her ECG looks pretty good despite tachycardia and symptoms of angina are mild, will hold off restarting heparin for another 24 hours to reduce  risk of spinal hemmorhage DC milrinone Give back a little fluid PO and IV Try to wean norepi Replete Magnesium Add basal insulin Prognosis remains guarded  Time Spent Directly with Patient:  60 minutes  Length of Stay:  LOS: 3 days    Maryn Freelove 02/21/2013, 9:08 AM

## 2013-02-21 NOTE — Progress Notes (Signed)
Paged [facility] re: tachycardia and anxiety. One time order for 0.5mg  ativan received. Advised to see if pt settles down. If not, directed to page Dr. Salena Saner.

## 2013-02-22 ENCOUNTER — Encounter (HOSPITAL_COMMUNITY)
Admission: RE | Disposition: A | Discharge status: Home / Self Care | Payer: Self-pay | Source: Ambulatory Visit | Attending: Neurological Surgery

## 2013-02-22 DIAGNOSIS — R509 Fever, unspecified: Secondary | ICD-10-CM

## 2013-02-22 HISTORY — PX: LEFT HEART CATHETERIZATION WITH CORONARY ANGIOGRAM: SHX5451

## 2013-02-22 HISTORY — PX: CARDIAC CATHETERIZATION: SHX172

## 2013-02-22 LAB — CARBOXYHEMOGLOBIN
Carboxyhemoglobin: 1.1 % (ref 0.5–1.5)
O2 Saturation: 53.1 %

## 2013-02-22 LAB — URINE CULTURE
Colony Count: NO GROWTH
Culture: NO GROWTH

## 2013-02-22 LAB — HEMOGLOBIN A1C
Hgb A1c MFr Bld: 7.2 % — ABNORMAL HIGH (ref <5.7)
Mean Plasma Glucose: 160 mg/dL — ABNORMAL HIGH (ref <117)

## 2013-02-22 LAB — GLUCOSE, CAPILLARY
Glucose-Capillary: 262 mg/dL — ABNORMAL HIGH (ref 70–99)
Glucose-Capillary: 189 mg/dL — ABNORMAL HIGH (ref 70–99)

## 2013-02-22 SURGERY — LEFT HEART CATHETERIZATION WITH CORONARY ANGIOGRAM
Anesthesia: LOCAL

## 2013-02-22 MED ORDER — FENTANYL CITRATE 0.05 MG/ML IJ SOLN
INTRAMUSCULAR | Status: AC
  Filled 2013-02-22: qty 2

## 2013-02-22 MED ORDER — SODIUM CHLORIDE 0.9 % IJ SOLN: 3.0000 mL | Freq: Two times a day (BID) | INTRAMUSCULAR | Status: DC

## 2013-02-22 MED ORDER — SODIUM CHLORIDE 0.9 % IV SOLN: 250.0000 mL | INTRAVENOUS | Status: DC | PRN

## 2013-02-22 MED ORDER — LIDOCAINE HCL (PF) 1 % IJ SOLN
INTRAMUSCULAR | Status: AC
  Filled 2013-02-22: qty 30

## 2013-02-22 MED ORDER — INSULIN GLARGINE 100 UNIT/ML ~~LOC~~ SOLN
15.0000 [IU] | Freq: Every morning | SUBCUTANEOUS | Status: DC
  Filled 2013-02-22: qty 0.15

## 2013-02-22 MED ORDER — SODIUM CHLORIDE 0.9 % IV SOLN
INTRAVENOUS | Status: AC
  Administered 2013-02-22: 15:00:00 via INTRAVENOUS

## 2013-02-22 MED ORDER — ASPIRIN 81 MG PO CHEW
81.0000 mg | CHEWABLE_TABLET | ORAL | Status: AC
  Administered 2013-02-22: 81 mg via ORAL
  Filled 2013-02-22: qty 1

## 2013-02-22 MED ORDER — HEPARIN (PORCINE) IN NACL 2-0.9 UNIT/ML-% IJ SOLN
INTRAMUSCULAR | Status: AC
  Filled 2013-02-22: qty 1000

## 2013-02-22 MED ORDER — NITROGLYCERIN 0.2 MG/ML ON CALL CATH LAB
INTRAVENOUS | Status: AC
  Filled 2013-02-22: qty 1

## 2013-02-22 MED ORDER — SODIUM CHLORIDE 0.9 % IV SOLN
INTRAVENOUS | Status: DC
  Administered 2013-02-22: 08:00:00 via INTRAVENOUS

## 2013-02-22 MED ORDER — SODIUM CHLORIDE 0.9 % IJ SOLN: 3.0000 mL | INTRAMUSCULAR | Status: DC | PRN

## 2013-02-22 MED ORDER — MIDAZOLAM HCL 2 MG/2ML IJ SOLN
INTRAMUSCULAR | Status: AC
  Filled 2013-02-22: qty 2

## 2013-02-22 NOTE — Progress Notes (Signed)
Subjective:  Still critically ill, but some signs of improvement. Feeling substantially better. No angina Pressor requirements have diminished, but still on norepi infusion. Less tachycardic.No dyspnea at rest. Antibiotics started yesterday for low grade fever, persistently elevated WBC and hemodynamic readings suggestive of sepsis. Source not identified. WBC elevation may be steroid related, fever due to recent MI or atelectasis.  UA normal except glucosuria and ketonuria. Glycemic control slightly better, will need higher insulin doses.  Objective:  Temp:  [98.2 F (36.8 C)-99.7 F (37.6 C)] 98.8 F (37.1 C) (12/22 0800) Pulse Rate:  [89-131] 120 (12/22 0800) Resp:  [0-28] 19 (12/22 0800) BP: (75-135)/(40-65) 104/47 mmHg (12/22 0800) SpO2:  [93 %-100 %] 93 % (12/22 0800) Weight change:   Intake/Output from previous day: 12/21 0701 - 12/22 0700 In: 2950.6 [P.O.:180; I.V.:2470.6; IV Piggyback:300] Out: 1345 [Urine:1345]  Intake/Output from this shift: Total I/O In: 24.4 [I.V.:24.4] Out: 150 [Urine:150]  Medications: Current Facility-Administered Medications  Medication Dose Route Frequency Provider Last Rate Last Dose  . 0.9 %  sodium chloride infusion   Intravenous PRN Glori Luis, MD 15 mL/hr at 02/21/13 2200    . 0.9 %  sodium chloride infusion   Intravenous PRN Glori Luis, MD      . 0.9 %  sodium chloride infusion  250 mL Intravenous PRN Jaylon Grode, MD      . 0.9 %  sodium chloride infusion   Intravenous Continuous Thurmon Fair, MD 10 mL/hr at 02/22/13 0751    . acetaminophen (TYLENOL) tablet 650 mg  650 mg Oral Q4H PRN Tia Alert, MD       Or  . acetaminophen (TYLENOL) suppository 650 mg  650 mg Rectal Q4H PRN Tia Alert, MD      . atorvastatin (LIPITOR) tablet 80 mg  80 mg Oral q1800 [name], NP   80 mg at 02/21/13 1729  . digoxin (LANOXIN) tablet 0.25 mg  0.25 mg Oral Daily Dolores Patty, MD   0.25 mg at 02/21/13 0931  .  DULoxetine (CYMBALTA) DR capsule 60 mg  60 mg Oral Daily Tia Alert, MD   60 mg at 02/21/13 0931  . glipiZIDE (GLUCOTROL XL) 24 hr tablet 10 mg  10 mg Oral BID WC Tia Alert, MD   10 mg at 02/21/13 1729  . insulin aspart (novoLOG) injection 0-15 Units  0-15 Units Subcutaneous TID WC Thurmon Fair, MD   8 Units at 02/22/13 0815  . insulin glargine (LANTUS) injection 15 Units  15 Units Subcutaneous q morning - 10a Jasani Lengel, MD   15 Units at 02/21/13 1252  . linagliptin (TRADJENTA) tablet 5 mg  5 mg Oral Daily Tia Alert, MD   5 mg at 02/21/13 0931  . menthol-cetylpyridinium (CEPACOL) lozenge 3 mg  1 lozenge Oral PRN Tia Alert, MD       Or  . phenol [facility]) mouth spray 1 spray  1 spray Mouth/Throat PRN Tia Alert, MD      . methocarbamol (ROBAXIN) tablet 500 mg  500 mg Oral Q6H PRN Tia Alert, MD   500 mg at 02/21/13 2205   Or  . methocarbamol (ROBAXIN) 500 mg in dextrose 5 % 50 mL IVPB  500 mg Intravenous Q6H PRN Tia Alert, MD   500 mg at 02/19/13 0849  . morphine 2 MG/ML injection 1-4 mg  1-4 mg Intravenous Q3H PRN Tia Alert, MD   2 mg at 02/21/13 0944  .  norepinephrine (LEVOPHED) 16 mg in dextrose 5 % 250 mL infusion  2-50 mcg/min Intravenous Titrated Tia Alert, MD 9.4 mL/hr at 02/22/13 0600 10 mcg/min at 02/22/13 0600  . ondansetron (ZOFRAN) injection 4 mg  4 mg Intravenous Q4H PRN Tia Alert, MD   4 mg at 02/22/13 0249  . oxyCODONE-acetaminophen (PERCOCET/ROXICET) 5-325 MG per tablet 1-2 tablet  1-2 tablet Oral Q4H PRN Tia Alert, MD   2 tablet at 02/22/13 0217  . sodium chloride 0.9 % injection 3 mL  3 mL Intravenous Q12H Merri Dimaano, MD      . sodium chloride 0.9 % injection 3 mL  3 mL Intravenous PRN Susann Lawhorne, MD      . spironolactone (ALDACTONE) tablet 12.5 mg  12.5 mg Oral Daily Dolores Patty, MD   12.5 mg at 02/20/13 1111  . vancomycin (VANCOCIN) 1,250 mg in sodium chloride 0.9 % 250 mL IVPB  1,250 mg Intravenous Q24H Tia Alert, MD   1,250 mg at 02/21/13 1253    Physical Exam: General appearance: alert, cooperative and no distress Neck: no adenopathy, no carotid bruit, no JVD, supple, symmetrical, trachea midline and thyroid not enlarged, symmetric, no tenderness/mass/nodules Lungs: clear to auscultation bilaterally Heart: regular rate and rhythm, S1, S2 normal and S3 present Abdomen: soft, non-tender; bowel sounds normal; no masses,  no organomegaly Extremities: extremities normal, atraumatic, no cyanosis or edema Pulses: 2+ and symmetric Skin: Skin color, texture, turgor normal. No rashes or lesions Neurologic: Grossly normal  Lab Results: Results for orders placed during the hospital encounter of 02/18/13 (from the past 48 hour(s))  GLUCOSE, CAPILLARY     Status: Abnormal   Collection Time    02/20/13 11:38 AM      Result Value Range   Glucose-Capillary 293 (*) 70 - 99 mg/dL  GLUCOSE, CAPILLARY     Status: Abnormal   Collection Time    02/20/13  5:22 PM      Result Value Range   Glucose-Capillary 257 (*) 70 - 99 mg/dL  GLUCOSE, CAPILLARY     Status: Abnormal   Collection Time    02/20/13  9:52 PM      Result Value Range   Glucose-Capillary 281 (*) 70 - 99 mg/dL  MAGNESIUM     Status: None   Collection Time    02/21/13  4:29 AM      Result Value Range   Magnesium 1.6  1.5 - 2.5 mg/dL  GLUCOSE, CAPILLARY     Status: Abnormal   Collection Time    02/21/13  8:03 AM      Result Value Range   Glucose-Capillary 324 (*) 70 - 99 mg/dL  CULTURE, BLOOD (ROUTINE X 2)     Status: None   Collection Time    02/21/13 11:11 AM      Result Value Range   Specimen Description BLOOD RIGHT ARM     Special Requests BOTTLES DRAWN AEROBIC ONLY 10CC     Culture  Setup Time       Value: 02/21/2013 17:55     Performed at [facility]   Culture       Value:        BLOOD CULTURE RECEIVED NO GROWTH TO DATE CULTURE WILL BE HELD FOR 5 DAYS BEFORE ISSUING A FINAL NEGATIVE REPORT     Performed at [facility]   Report Status PENDING    CULTURE, BLOOD (ROUTINE X 2)     Status:  None   Collection Time    02/21/13 11:50 AM      Result Value Range   Specimen Description BLOOD RIGHT ARM     Special Requests BOTTLES DRAWN AEROBIC ONLY 10CC     Culture  Setup Time       Value: 02/21/2013 17:55     Performed at [facility]   Culture       Value:        BLOOD CULTURE RECEIVED NO GROWTH TO DATE CULTURE WILL BE HELD FOR 5 DAYS BEFORE ISSUING A FINAL NEGATIVE REPORT     Performed at [facility]   Report Status PENDING    GLUCOSE, CAPILLARY     Status: Abnormal   Collection Time    02/21/13 12:30 PM      Result Value Range   Glucose-Capillary 313 (*) 70 - 99 mg/dL  URINALYSIS, ROUTINE W REFLEX MICROSCOPIC     Status: Abnormal   Collection Time    02/21/13 12:51 PM      Result Value Range   Color, Urine YELLOW  YELLOW   APPearance CLEAR  CLEAR   Specific Gravity, Urine 1.025  1.005 - 1.030   pH 6.0  5.0 - 8.0   Glucose, UA >1000 (*) NEGATIVE mg/dL   Hgb urine dipstick TRACE (*) NEGATIVE   Bilirubin Urine MODERATE (*) NEGATIVE   Ketones, ur >80 (*) NEGATIVE mg/dL   Protein, ur 30 (*) NEGATIVE mg/dL   Urobilinogen, UA 0.2  0.0 - 1.0 mg/dL   Nitrite NEGATIVE  NEGATIVE   Leukocytes, UA NEGATIVE  NEGATIVE  URINE MICROSCOPIC-ADD ON     Status: None   Collection Time    02/21/13 12:51 PM      Result Value Range   Squamous Epithelial / LPF RARE  RARE   WBC, UA 0-2  <3 WBC/hpf   RBC / HPF 3-6  <3 RBC/hpf  GLUCOSE, CAPILLARY     Status: Abnormal   Collection Time    02/21/13  4:27 PM      Result Value Range   Glucose-Capillary 203 (*) 70 - 99 mg/dL  GLUCOSE, CAPILLARY     Status: Abnormal   Collection Time    02/21/13 10:04 PM      Result Value Range   Glucose-Capillary 220 (*) 70 - 99 mg/dL  GLUCOSE, CAPILLARY     Status: Abnormal   Collection Time    02/22/13  7:50 AM      Result Value Range   Glucose-Capillary 262 (*) 70 - 99 mg/dL    Imaging: Imaging  results have been reviewed and No results found.  Assessment:  1. Principal Problem: 2.   ST elevation myocardial infarction (STEMI) of lateral wall 3. Active Problems: 4.   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   HTN (hypertension) 6.   Dyslipidemia, goal LDL below 70 7.   Diabetes 8.   Hypotension due to drugs 9.   Cardiogenic shock 10.   Plan:  1. Coronary angiography today. Differential diagnosis is Takotsubo sd. Versus left main related ACS versus multivessel CAD. Less likely to be a candidate for PCI than CABG. Course of therapy will have to take into account her recent lumbar spine surgery 2. Antibiotics started yesterday for low grade fever, persistently elevated WBC and hemodynamic readings suggestive of sepsis. Source not identified. WBC elevation may be steroid related, fever due to recent MI or atelectasis. 3. PA catheter seems to have poor waveform - will check  at fluoroscopy, probably out to far; current readings appear unreliable - will probably have to remove it. 4. Decompensated hyperglycemia due to hyperadrenergic state. Glycemic control slightly better, will need higher insulin doses.   Time Spent Directly with Patient:  50 minutes  Length of Stay:  LOS: 4 days    Tyreik Delahoussaye 02/22/2013, 9:01 AM

## 2013-02-22 NOTE — Progress Notes (Signed)
Cath Lab Visit (complete for each Cath Lab visit)  Clinical Evaluation Leading to the Procedure:   ACS: yes

## 2013-02-22 NOTE — CV Procedure (Signed)
CARDIAC CATHETERIZATION REPORT   Procedures performed:  1. Left heart catheterization  2. Selective coronary angiography  3. Left ventriculography   Reason for procedure:  Acute ST segment elevation myocardial infarction with spontaneous reperfusion Cardiogenic shock  Procedure performed by: Thurmon Fair, MD, [facility]  Complications: none   Estimated blood loss: less than 5 mL   History:  67 year old woman suffered acute onset of severe chest pain, ST segment elevation and elevated cardiac troponin the day following lumbar spine fusion surgery. The echocardiographic abnormalities resolved spontaneously. The troponin peaked at around 4. Echocardiography showed extensive left ventricular regional wall motion abnormalities, but did not clearly SPECT a particular coronary distribution. She has been in cardiogenic shock requiring pressor support for the last 72 hours, with recent signs of improvement.  Consent: The risks, benefits, and details of the procedure were explained to the patient. Risks including death, MI, stroke, bleeding, limb ischemia, renal failure and allergy were described and accepted by the patient. Informed written consent was obtained prior to proceeding.  Technique: The patient was brought to the cardiac catheterization laboratory in the fasting state. He was prepped and draped in the usual sterile fashion. Local anesthesia with 1% lidocaine was administered to the right groin area. Using the modified Seldinger technique a 5 French right common femoral artery sheath was introduced without difficulty. Under fluoroscopic guidance, using 5 [place] JL4, JR and angled pigtail catheters, selective cannulation of the left coronary artery, right coronary artery and left ventricle were respectively performed. Several coronary angiograms in a variety of projections were recorded, as well as a left ventriculogram in the RAO projection. Left ventricular pressure and a pull back to the aorta  were recorded. No immediate complications occurred. At the end of the procedure, all catheters were removed. After the procedure, hemostasis will be achieved with manual pressure.  Contrast used: 75 mL Omnipaque  Angiographic Findings:  1. The left main coronary artery is free of significant atherosclerosis and bifurcates in the usual fashion into the left anterior descending artery and left circumflex coronary artery.  2. The left anterior descending artery is a large vessel that reaches the apex and generates 1 major diagonal branch. There is evidence of mild luminal irregularities, especially in the distal third of the vessel and no calcification. No hemodynamically meaningful stenoses are seen. 3. The left circumflex coronary artery is a large-size vessel non- dominant vessel that generates only one major oblique marginal artery. There is evidence of minimal luminal irregularities and no calcification. No hemodynamically meaningful stenoses are seen. 4. The right coronary artery is a large-size dominant vessel that generates a long posterior lateral ventricular system as well as the PDA. There is evidence of minimal luminal irregularities and no calcification. No hemodynamically meaningful stenoses are seen.  5. The left ventricle is normal in size. The left ventricle systolic function is moderately decreased with an estimated ejection fraction of 40%. Regional wall motion abnormalities are seen, with a distribution typical for stress cardiomyopathy. There is severe midcavity hypokinesis with normal apical contractility and hyperdynamic base. No left ventricular thrombus is seen. There is no mitral insufficiency. The ascending aorta appears normal. There is no aortic valve stenosis by pullback. The left ventricular end-diastolic pressure is 13 mm Hg.    IMPRESSIONS:  Postoperative stress cardiomyopathy RECOMMENDATION:  Continue supportive care. Remove pulmonary artery catheter. Wean off  norepinephrine as allowed by blood pressure. Expected full recovery of myocardial function. Risk factor modification for early coronary atherosclerosis.      Fredia Chittenden  Marty Uy, MD, [facility] [phone] office [phone] pager

## 2013-02-22 NOTE — Progress Notes (Signed)
Patient ID: Sarah Flores, female   DOB: 30-Apr-1945, 67 y.o.   MRN: [id number] doing well from neuro standpoint. No real back pain or radicular pain. Very pleased her left leg pain is gone. Good LE strength to in bed exam. For cath today. Obviously , there is risk, but on POD 5 the risk should be small for hemorrhagic complication from procedure (PLIF), and there is certainly a quantifiable risk of further delay of definitive intervention. I have discussed this with her at length and she has demonstrated understanding.

## 2013-02-23 ENCOUNTER — Inpatient Hospital Stay (HOSPITAL_COMMUNITY): Payer: Medicare Other

## 2013-02-23 ENCOUNTER — Encounter (HOSPITAL_COMMUNITY): Payer: Self-pay | Admitting: Cardiology

## 2013-02-23 DIAGNOSIS — I429 Cardiomyopathy, unspecified: Secondary | ICD-10-CM | POA: Diagnosis not present

## 2013-02-23 DIAGNOSIS — I428 Other cardiomyopathies: Secondary | ICD-10-CM

## 2013-02-23 LAB — COMPREHENSIVE METABOLIC PANEL
Alkaline Phosphatase: 62 U/L (ref 39–117)
BUN: 5 mg/dL — ABNORMAL LOW (ref 6–23)
CO2: 28 mEq/L (ref 19–32)
Calcium: 7 mg/dL — ABNORMAL LOW (ref 8.4–10.5)
GFR calc Af Amer: >90 mL/min (ref >90)
GFR calc non Af Amer: 88 mL/min — ABNORMAL LOW (ref >90)
Glucose, Bld: 330 mg/dL — ABNORMAL HIGH (ref 70–99)
Potassium: 3.1 mEq/L — ABNORMAL LOW (ref 3.5–5.1)
Total Protein: 6.5 g/dL (ref 6.0–8.3)

## 2013-02-23 LAB — CBC
HCT: 35.2 % — ABNORMAL LOW (ref 36.0–46.0)
Hemoglobin: 12.3 g/dL (ref 12.0–15.0)
MCH: 32.9 pg (ref 26.0–34.0)
MCHC: 34.9 g/dL (ref 30.0–36.0)

## 2013-02-23 LAB — GLUCOSE, CAPILLARY
Glucose-Capillary: 271 mg/dL — ABNORMAL HIGH (ref 70–99)
Glucose-Capillary: 187 mg/dL — ABNORMAL HIGH (ref 70–99)

## 2013-02-23 LAB — BASIC METABOLIC PANEL
Chloride: 94 mEq/L — ABNORMAL LOW (ref 96–112)
Creatinine, Ser: 0.92 mg/dL (ref 0.50–1.10)
GFR calc Af Amer: 74 mL/min — ABNORMAL LOW (ref >90)
GFR calc non Af Amer: 64 mL/min — ABNORMAL LOW (ref >90)
Potassium: 3.2 mEq/L — ABNORMAL LOW (ref 3.5–5.1)

## 2013-02-23 MED ORDER — POTASSIUM CHLORIDE 10 MEQ/50ML IV SOLN
10.0000 meq | INTRAVENOUS | Status: AC
  Administered 2013-02-23 (×3): 10 meq via INTRAVENOUS
  Filled 2013-02-23 (×4): qty 50

## 2013-02-23 MED ORDER — POTASSIUM CHLORIDE CRYS ER 20 MEQ PO TBCR: 40.0000 meq | EXTENDED_RELEASE_TABLET | Freq: Once | ORAL | Status: DC

## 2013-02-23 MED ORDER — INSULIN GLARGINE 100 UNIT/ML ~~LOC~~ SOLN
20.0000 [IU] | Freq: Every morning | SUBCUTANEOUS | Status: DC
  Administered 2013-02-24 – 2013-02-28 (×5): 20 [IU] via SUBCUTANEOUS
  Filled 2013-02-23 (×6): qty 0.2

## 2013-02-23 MED ORDER — POTASSIUM CHLORIDE CRYS ER 20 MEQ PO TBCR
40.0000 meq | EXTENDED_RELEASE_TABLET | Freq: Once | ORAL | Status: AC
  Administered 2013-02-23: 40 meq via ORAL
  Filled 2013-02-23: qty 2

## 2013-02-23 NOTE — Progress Notes (Signed)
Sarah Flores notified of pt decreased blood pressure and that levophed has been restarted.  Will continue to monitor pt closely.

## 2013-02-23 NOTE — Progress Notes (Addendum)
Subjective:  having problems relaxing, wants to go home prior to Christmas. Still weak, no BM, if turns her head + dizziness-poss due to catheter?  Mild chest pressure, back pain present but much better than admit  Objective: Vital signs in last 24 hours: Temp:  [97.8 F (36.6 C)-99.9 F (37.7 C)] 97.8 F (36.6 C) (12/23 0700) Pulse Rate:  [84-137] 98 (12/23 0700) Resp:  [12-30] 24 (12/23 0700) BP: (79-156)/(35-90) 112/90 mmHg (12/23 0700) SpO2:  [87 %-100 %] 95 % (12/23 0700) Weight change:  Last BM Date: 02/17/13 Intake/Output from previous day: -431  12/22 0701 - 12/23 0700 In: 2384.2 [P.O.:770; I.V.:1364.2; IV Piggyback:250] Out: 2825 [Urine:2825] Intake/Output this shift:    PE: General:Pleasant affect, NAD Skin:Warm and dry, brisk capillary refill HEENT:normocephalic, sclera clear, mucus membranes moist Neck:supple, no JVD, IV line Rt neck stable  Heart:S1S2 RRR without murmur, gallup, rub or click Lungs:clear without rales, rhonchi, or wheezes ZOX:WRUE, non tender, + BS, do not palpate liver spleen or masses Ext:no lower ext edema, 2+ pedal pulses, 2+ radial pulses Neuro:alert and oriented, MAE, follows commands, + facial symmetry    Lab Results  Component Value Date   HGBA1C 7.2* 02/22/2013     Lab Results  Component Value Date   TSH 1.370 02/19/2013     Studies/Results: 02/22/13 heart cath: Postoperative stress cardiomyopathy EF 40% CO/CI 5.6/3.2  Medications: I have reviewed the patient's current medications. Scheduled Meds: . atorvastatin  80 mg Oral q1800  . digoxin  0.25 mg Oral Daily  . DULoxetine  60 mg Oral Daily  . glipiZIDE  10 mg Oral BID WC  . insulin aspart  0-15 Units Subcutaneous TID WC  . insulin glargine  15 Units Subcutaneous q morning - 10a  . linagliptin  5 mg Oral Daily  . spironolactone  12.5 mg Oral Daily  . vancomycin  1,250 mg Intravenous Q24H   Continuous Infusions: . 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  Slow wean on levophed.  Check labs. Add colace.  EF on echo 25-35% on cath 40%.  So no Need for life vest? Chest pressure-EKG improved.  Order cardiac rehab, ambulate once off levophed. Glucose elevated on Moderate SSI, increase lantus.  LOS: 5 days   Time spent with pt. :30 minutes. [facility]  Nurse Practitioner Certified Pager [phone] or after 5pm and on weekends call [phone] 02/23/2013, 8:01 AM  I have seen and evaluated the patient this AM along with [name], NP. I agree with her findings, examination as well as impression recommendations.  Cardiac From yesterday revealed essentially normal coronary arteries suggesting stress-induced cardiomyopathy as etiology for the chest discomfort time ECG changes and "cardiogenic" shock. Currently weaning off Levophed, able to weaned off today, would recheck echocardiogram tomorrow. Could also potentially move to step down unit it off pressors and DC central line. Abdomen despite being on start back her beta blocker or ARB during this hospitalization at home doses, but would like to at least  restart low-dose beta blocker to avoid rebound. No active signs of heart failure. Not on diuretic. Significant improvement in white blood cell count, and afebrile now. Is currently on vancomycin, but to date no growth from urine or blood cultures. We'll continue vancomycin today, but  consider discontinuing tomorrow if stable.  Will need to ambulate off pressors. Replete potassium Agree with increased Lantus dose for now, we'll need to reassess prior to discharge.   Marykay Lex, M.D., M.S. [facility] [address] [place], [place]  [address]  [phone] Pager # [phone] 02/23/2013 11:30 AM

## 2013-02-23 NOTE — Progress Notes (Signed)
Patient ID: Sarah Flores, female   DOB: [birth year], 67 y.o.   MRN: [id number] Doing well from a neurosurgical standpoint. No back or leg pain. No numbness tingling or weakness. Denies significant chest pain. I am pleased with her progress and she seems happy

## 2013-02-23 NOTE — Progress Notes (Signed)
[name], NP notified of pt nausea/vomiting.  Requested KCl orders be changed to IV route.

## 2013-02-24 ENCOUNTER — Encounter (HOSPITAL_COMMUNITY): Payer: Self-pay | Admitting: Neurological Surgery

## 2013-02-24 DIAGNOSIS — I9589 Other hypotension: Secondary | ICD-10-CM

## 2013-02-24 DIAGNOSIS — T50904A Poisoning by unspecified drugs, medicaments and biological substances, undetermined, initial encounter: Secondary | ICD-10-CM

## 2013-02-24 DIAGNOSIS — I5181 Takotsubo syndrome: Secondary | ICD-10-CM | POA: Diagnosis not present

## 2013-02-24 LAB — BASIC METABOLIC PANEL
BUN: 5 mg/dL — ABNORMAL LOW (ref 6–23)
Calcium: 6.5 mg/dL — ABNORMAL LOW (ref 8.4–10.5)
Creatinine, Ser: 0.84 mg/dL (ref 0.50–1.10)
GFR calc non Af Amer: 71 mL/min — ABNORMAL LOW (ref >90)
Glucose, Bld: 171 mg/dL — ABNORMAL HIGH (ref 70–99)
Sodium: 137 mEq/L (ref 135–145)

## 2013-02-24 LAB — GLUCOSE, CAPILLARY
Glucose-Capillary: 192 mg/dL — ABNORMAL HIGH (ref 70–99)
Glucose-Capillary: 153 mg/dL — ABNORMAL HIGH (ref 70–99)
Glucose-Capillary: 80 mg/dL (ref 70–99)

## 2013-02-24 MED ORDER — POTASSIUM CHLORIDE CRYS ER 20 MEQ PO TBCR
20.0000 meq | EXTENDED_RELEASE_TABLET | Freq: Two times a day (BID) | ORAL | Status: AC
  Administered 2013-02-24 (×2): 20 meq via ORAL
  Filled 2013-02-24 (×2): qty 1

## 2013-02-24 NOTE — Progress Notes (Signed)
Patient ID: Sarah Flores, female   DOB: Jul 04, 1945, 67 y.o.   MRN: [id number] Doing well. Back sore, no leg pain or N/T/W. BP 93/60 at present. Incision CDI. Good strength.

## 2013-02-24 NOTE — Progress Notes (Addendum)
Subjective: Temporarily off of Levophed yesterday - but may have "overdone it" with activity -- got nauseated & hypotensive - Levophed restarted --> stopped as of ~5 AM this AM.  BP holding steady in high 90s.  Objective: Vital signs in last 24 hours: Temp:  [97.9 F (36.6 C)-98.8 F (37.1 C)] 98.2 F (36.8 C) (12/24 0400) Pulse Rate:  [75-116] 81 (12/24 0700) Resp:  [13-31] 13 (12/24 0700) BP: (73-122)/(36-76) 97/51 mmHg (12/24 0700) SpO2:  [87 %-100 %] 100 % (12/24 0700) Weight change:  Last BM Date: 02/17/13 Intake/Output from previous day: -431  12/23 0701 - 12/24 0700 In: 1294.6 [P.O.:610; I.V.:234.6; IV Piggyback:450] Out: 975 [Urine:975] Intake/Output this shift:    PE: General:Pleasant affect, NAD Skin:Warm and dry, brisk capillary refill HEENT:normocephalic, sclera clear, mucus membranes moist Neck:supple, no JVD, IV line Rt neck stable  Heart:S1S2 RRR, No MR/G Lungs:CTAB, no W/R/R WNU:UVOZ, non tender, + BS, do not palpate liver spleen or masses Ext:no lower ext edema, 2+ pedal pulses, 2+ radial pulses Neuro:alert and oriented, MAE, follows commands, + facial symmetry    Lab Results  Component Value Date   HGBA1C 7.2* 02/22/2013     Lab Results  Component Value Date   TSH 1.370 02/19/2013     Studies/Results: 02/22/13 heart cath: Postoperative stress cardiomyopathy EF 40% CO/CI 5.6/3.2  Medications: I have reviewed the patient's current medications. Scheduled Meds: . atorvastatin  80 mg Oral q1800  . digoxin  0.25 mg Oral Daily  . DULoxetine  60 mg Oral Daily  . glipiZIDE  10 mg Oral BID WC  . insulin aspart  0-15 Units Subcutaneous TID WC  . insulin glargine  20 Units Subcutaneous q morning - 10a  . linagliptin  5 mg Oral Daily  . spironolactone  12.5 mg Oral Daily  . vancomycin  1,250 mg Intravenous Q24H   Continuous Infusions: . 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 EF ~40%.  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  Slow wean on levophed.  Check labs. Add colace.  EF on echo 25-35% on cath 40% -- recheck limited echo in 1-2 days, but prior to d/c to determine +/- Life Vest (I anticipate that her EF has improved & will not need LifeVest) Cardiac rehab - gently ambulate now that she is off levophed. Glucose elevated on Moderate SSI, increased lantus yesterday - better control.  A1c only 7.2, so I don't think she will need much more..  LOS: 6 days   Could also potentially move to step down unit it off pressors and DC central line.  Unable to start back her beta blocker or ARB during this hospitalization at home doses, but would like to at least restart low-dose beta blocker to avoid rebound.  On low dose spironolactone.  -- ProBNP quite elevated, but no SSx of HF (CXR yesterday without Pulm Edema) &  Not on loop diuretic.  Significant improvement in white blood cell count, and afebrile now. Is currently on  vancomycin, but to date no growth from urine or blood cultures. We'll continue vancomycin until Friday for ~7 day course.  Will need close assessment by PT/OT to determine stability for d/c.  Not yet ready for PT - maybe tomorrow.   Sarah Flores, M.D., M.S. [facility] [address] [place], [place]  [address]  [phone] Pager # [phone] 02/24/2013 8:06  AM

## 2013-02-24 NOTE — Evaluation (Signed)
Physical Therapy Evaluation Patient Details Name: Sarah Flores MRN: [id number] DOB: Jul 24, 1945 Today's Date: 02/24/2013 Time: 1030-1059 PT Time Calculation (min): 29 min  PT Assessment / Plan / Recommendation History of Present Illness  67 yo s/p PLIF L5- S1 12/18 with STEMI 12/19  Clinical Impression  Pt very pleasant and able to recall back precautions but required education and cueing for how to enforce and maintain with mobility. Pt will benefit from acute therapy to maximize mobility, safety and independence prior to discharge. Spouse present throughout. Pt able to don/doff brace without assist and benefited from use of RW today. RN and cardiac rehab stated Herbie Baltimore verbally ok'd limited mobility today and proceeded within BP allowance. Will continue to follow.     PT Assessment  Patient needs continued PT services    Follow Up Recommendations  Supervision for mobility/OOB;Home health PT (pending if pt meets goals acutely)    Does the patient have the potential to tolerate intense rehabilitation      Barriers to Discharge        Equipment Recommendations  None recommended by PT    Recommendations for Other Services     Frequency Min 5X/week    Precautions / Restrictions Precautions Precautions: Back Precaution Booklet Issued: Yes (comment) Precaution Comments: educated and reviewed via teachback and handout Required Braces or Orthoses: Spinal Brace Spinal Brace: Lumbar corset;Applied in sitting position Restrictions Weight Bearing Restrictions: No   Pertinent Vitals/Pain No pain HR 82-98 sats 100% on RA BP 85/55 (60) supine 94/59 (68) sitting 89/59 (62) walking No dizziness only fatigue      Mobility  Bed Mobility Bed Mobility: Right Sidelying to Sit;Sit to Sidelying Right Right Sidelying to Sit: 4: Min assist;HOB flat Sitting - Scoot to Edge of Bed: 5: Supervision Sit to Sidelying Right: 4: Min guard;HOB flat Details for Bed Mobility Assistance: cueing  for posture and precautions with sequence Transfers Sit to Stand: 5: Supervision;From bed Stand to Sit: To bed;5: Supervision Details for Transfer Assistance: cues for posture and hand placement Ambulation/Gait Ambulation/Gait Assistance: 4: Min guard Ambulation Distance (Feet): 50 Feet Assistive device: Rolling walker Ambulation/Gait Assistance Details: cues to maintain eyes open, very slow gait and frequent pauses due to fatigue Gait Pattern: Step-through pattern;Decreased stride length Stairs: No    Exercises     PT Diagnosis: Difficulty walking  PT Problem List: Decreased strength;Decreased activity tolerance;Decreased mobility;Decreased knowledge of precautions;Cardiopulmonary status limiting activity PT Treatment Interventions: DME instruction;Gait training;Functional mobility training;Therapeutic activities;Therapeutic exercise;Balance training;Patient/family education     PT Goals(Current goals can be found in the care plan section) Acute Rehab PT Goals Patient Stated Goal: be able to get back to sewing PT Goal Formulation: With patient/family Time For Goal Achievement: 03/10/13 Potential to Achieve Goals: Good  Visit Information  Last PT Received On: 02/24/13 Assistance Needed: +1 History of Present Illness: 67 yo s/p PLIF L5- S1 12/18 with STEMI 12/19       Prior Functioning  Home Living Family/patient expects to be discharged to:: Private residence Living Arrangements: Spouse/significant other Available Help at Discharge: Family Type of Home: House Home Access: Ramped entrance Home Layout: Two level;Able to live on main level with bedroom/bathroom Home Equipment: Dan Humphreys - 2 wheels;Walker - 4 wheels;Cane - quad;Shower seat;Grab bars - toilet;Grab bars - tub/shower;Hand held shower head Prior Function Level of Independence: Independent Communication Communication:  (wears glasses) Dominant Hand: Right    Cognition  Cognition Arousal/Alertness:  Awake/alert Behavior During Therapy: WFL for tasks assessed/performed Overall Cognitive Status: Within  Functional Limits for tasks assessed    Extremity/Trunk Assessment Upper Extremity Assessment Upper Extremity Assessment: Overall WFL for tasks assessed Lower Extremity Assessment Lower Extremity Assessment: Overall WFL for tasks assessed Cervical / Trunk Assessment Cervical / Trunk Assessment: Normal   Balance    End of Session PT - End of Session Equipment Utilized During Treatment: Gait belt;Back brace Activity Tolerance: Patient tolerated treatment well;Patient limited by fatigue Patient left: in bed;with call bell/phone within reach;with family/visitor present Nurse Communication: Mobility status;Precautions  GP     Toney Sang Beth 02/24/2013, 11:09 AM  Delaney Meigs, PT [phone]

## 2013-02-24 NOTE — Progress Notes (Signed)
ANTIBIOTIC CONSULT NOTE - FOLLOW UP  Pharmacy Consult for Vancomycin Indication: Empiric coverage post spinal surgery  Allergies  Allergen Reactions  . Penicillins Anaphylaxis    Patient Measurements: Height: 5\' 3"  (160 cm) Weight: 165 lb 5.5 oz (75 kg) IBW/kg (Calculated) : 52.4  Vital Signs: Temp: 98.1 F (36.7 C) (12/24 1200) Temp src: Oral (12/24 1200) BP: 142/54 mmHg (12/24 1300) Pulse Rate: 87 (12/24 1300) Intake/Output from previous day: 12/23 0701 - 12/24 0700 In: 1294.6 [P.O.:610; I.V.:234.6; IV Piggyback:450] Out: 975 [Urine:975] Intake/Output from this shift: Total I/O In: 790 [P.O.:480; I.V.:60; IV Piggyback:250] Out: 900 [Urine:900]  Labs:  Recent Labs  02/23/13 0900 02/23/13 2101 02/24/13 0500  WBC 12.2*  --   --   HGB 12.3  --   --   PLT 308  --   --   CREATININE 0.73 0.92 0.84   Estimated Creatinine Clearance: 64.7 ml/min (by C-G formula based on Cr of 0.84). No results found for this basename: VANCOTROUGH, Leodis Binet, VANCORANDOM, GENTTROUGH, GENTPEAK, GENTRANDOM, TOBRATROUGH, TOBRAPEAK, TOBRARND, AMIKACINPEAK, AMIKACINTROU, AMIKACIN,  in the last 72 hours   Microbiology: Recent Results (from the past 720 hour(s))  SURGICAL PCR SCREEN     Status: Abnormal   Collection Time    02/10/13  8:43 AM      Result Value Range Status   MRSA, PCR NEGATIVE  NEGATIVE Final   Staphylococcus aureus POSITIVE (*) NEGATIVE Final   Comment:            The Xpert SA Assay (FDA     approved for NASAL specimens     in patients over 72 years of age),     is one component of     a comprehensive surveillance     program.  Test performance has     been validated by [employer] for patients greater     than or equal to 22 year old.     It is not intended     to diagnose infection nor to     guide or monitor treatment.  MRSA PCR SCREENING     Status: None   Collection Time    02/19/13 12:46 PM      Result Value Range Status   MRSA by PCR NEGATIVE  NEGATIVE  Final   Comment:            The GeneXpert MRSA Assay (FDA     approved for NASAL specimens     only), is one component of a     comprehensive MRSA colonization     surveillance program. It is not     intended to diagnose MRSA     infection nor to guide or     monitor treatment for     MRSA infections.  CULTURE, BLOOD (ROUTINE X 2)     Status: None   Collection Time    02/21/13 11:11 AM      Result Value Range Status   Specimen Description BLOOD RIGHT ARM   Final   Special Requests BOTTLES DRAWN AEROBIC ONLY 10CC   Final   Culture  Setup Time     Final   Value: 02/21/2013 17:55     Performed at [facility]   Culture     Final   Value:        BLOOD CULTURE RECEIVED NO GROWTH TO DATE CULTURE WILL BE HELD FOR 5 DAYS BEFORE ISSUING A FINAL NEGATIVE REPORT  Performed at [facility]   Report Status PENDING   Incomplete  CULTURE, BLOOD (ROUTINE X 2)     Status: None   Collection Time    02/21/13 11:50 AM      Result Value Range Status   Specimen Description BLOOD RIGHT ARM   Final   Special Requests BOTTLES DRAWN AEROBIC ONLY 10CC   Final   Culture  Setup Time     Final   Value: 02/21/2013 17:55     Performed at [facility]   Culture     Final   Value:        BLOOD CULTURE RECEIVED NO GROWTH TO DATE CULTURE WILL BE HELD FOR 5 DAYS BEFORE ISSUING A FINAL NEGATIVE REPORT     Performed at [facility]   Report Status PENDING   Incomplete  URINE CULTURE     Status: None   Collection Time    02/21/13 12:51 PM      Result Value Range Status   Specimen Description URINE, CATHETERIZED   Final   Special Requests NONE   Final   Culture  Setup Time     Final   Value: 02/21/2013 17:59     Performed at [facility] Count     Final   Value: NO GROWTH     Performed at [facility]   Culture     Final   Value: NO GROWTH     Performed at [facility]   Report Status 02/22/2013 FINAL   Final    Anti-infectives    Start     Dose/Rate Route Frequency Ordered Stop   02/21/13 1230  vancomycin (VANCOCIN) 1,250 mg in sodium chloride 0.9 % 250 mL IVPB     1,250 mg 166.7 mL/hr over 90 Minutes Intravenous Every 24 hours 02/21/13 1138 02/26/13 2359   02/19/13 0100  vancomycin (VANCOCIN) IVPB 1000 mg/200 mL premix     1,000 mg 200 mL/hr over 60 Minutes Intravenous  Once 02/18/13 1849 02/19/13 0134   02/18/13 1408  bacitracin 50,000 Units in sodium chloride irrigation 0.9 % 500 mL irrigation  Status:  Discontinued       As needed 02/18/13 1408 02/18/13 1609   02/18/13 1330  vancomycin (VANCOCIN) 1,000 mg in sodium chloride 0.9 % 250 mL IVPB  Status:  Discontinued     1,000 mg 250 mL/hr over 60 Minutes Intravenous Every 12 hours 02/18/13 1329 02/18/13 1750   02/18/13 1304  vancomycin (VANCOCIN) 1 GM/200ML IVPB    Comments:  Hypes, Karen   : cabinet override      02/18/13 1304 02/19/13 0114      Assessment: 67 y.o. F who continues on Vancomycin for post-op prophylaxis s/p spinal surgery on 12/18. Afebrile, WBC 12.2, renal function remains stable. Per cardiology, to continue Vancomycin through 12/26 for a 7 day course -- stop date entered.  Goal of Therapy:  Vancomycin trough level 10-15 mcg/ml  Plan:  1. Continue Vancomycin 1250 mg IV every 24 hours 2. Will continue to follow renal function, culture results, LOT, and antibiotic de-escalation plans   Georgina Pillion, PharmD, BCPS Clinical Pharmacist Pager: [phone] 02/24/2013 2:13 PM

## 2013-02-24 NOTE — Progress Notes (Signed)
CARDIAC REHAB PHASE I   PRE:  Rate/Rhythm: 94SR  BP:  Supine: 113/59  Sitting:   Standing:    SaO2: 97%RA  MODE:  Ambulation: 20 ft  Outside of room    POST:  Rate/Rhythm: 110ST  BP:  Supine: 96/66  Sitting:   Standing:    SaO2: 99%RA 1405-1450 Pt walked 20 ft outside of room after applying her back brace. Asst x2 with rolling walker with slow steady pace. Tired easily. No c/o CP. C/o SOB, exhausted. Back to bed and assisted to side position. BP decreased with walk.   Luetta Nutting, RN BSN  02/24/2013 2:44 PM

## 2013-02-25 DIAGNOSIS — I059 Rheumatic mitral valve disease, unspecified: Secondary | ICD-10-CM

## 2013-02-25 HISTORY — PX: TRANSTHORACIC ECHOCARDIOGRAM: SHX275

## 2013-02-25 LAB — CBC
HCT: 30.4 % — ABNORMAL LOW (ref 36.0–46.0)
Hemoglobin: 10.7 g/dL — ABNORMAL LOW (ref 12.0–15.0)
MCH: 33.5 pg (ref 26.0–34.0)
Platelets: 254 10*3/uL (ref 150–400)
RBC: 3.19 MIL/uL — ABNORMAL LOW (ref 3.87–5.11)
RDW: 13.6 % (ref 11.5–15.5)
WBC: 9.2 10*3/uL (ref 4.0–10.5)

## 2013-02-25 LAB — BASIC METABOLIC PANEL
CO2: 25 mEq/L (ref 19–32)
Chloride: 107 mEq/L (ref 96–112)
Creatinine, Ser: 0.74 mg/dL (ref 0.50–1.10)
GFR calc Af Amer: >90 mL/min (ref >90)
Glucose, Bld: 63 mg/dL — ABNORMAL LOW (ref 70–99)
Potassium: 2.9 mEq/L — ABNORMAL LOW (ref 3.5–5.1)
Sodium: 142 mEq/L (ref 135–145)

## 2013-02-25 LAB — GLUCOSE, CAPILLARY
Glucose-Capillary: 82 mg/dL (ref 70–99)
Glucose-Capillary: 200 mg/dL — ABNORMAL HIGH (ref 70–99)
Glucose-Capillary: 123 mg/dL — ABNORMAL HIGH (ref 70–99)

## 2013-02-25 LAB — POTASSIUM: Potassium: 3.6 mEq/L (ref 3.5–5.1)

## 2013-02-25 MED ORDER — POTASSIUM CHLORIDE CRYS ER 20 MEQ PO TBCR
40.0000 meq | EXTENDED_RELEASE_TABLET | Freq: Once | ORAL | Status: AC
  Administered 2013-02-25: 40 meq via ORAL
  Filled 2013-02-25: qty 2

## 2013-02-25 MED ORDER — POTASSIUM CHLORIDE 10 MEQ/50ML IV SOLN
10.0000 meq | INTRAVENOUS | Status: AC
  Administered 2013-02-25 (×4): 10 meq via INTRAVENOUS
  Filled 2013-02-25 (×4): qty 50

## 2013-02-25 MED ORDER — MAGNESIUM SULFATE 40 MG/ML IJ SOLN
2.0000 g | Freq: Once | INTRAMUSCULAR | Status: AC
  Administered 2013-02-25: 2 g via INTRAVENOUS
  Filled 2013-02-25: qty 50

## 2013-02-25 MED ORDER — MAGNESIUM OXIDE 400 (241.3 MG) MG PO TABS
400.0000 mg | ORAL_TABLET | Freq: Two times a day (BID) | ORAL | Status: DC
  Administered 2013-02-26 – 2013-02-28 (×5): 400 mg via ORAL
  Filled 2013-02-25 (×6): qty 1

## 2013-02-25 MED ORDER — MAGNESIUM SULFATE 40 MG/ML IJ SOLN: 2.0000 g | Freq: Once | INTRAMUSCULAR | Status: DC

## 2013-02-25 NOTE — Progress Notes (Signed)
Patient ID: Sarah Flores, female   DOB: 1945/05/22, 67 y.o.   MRN: [id number] Resting. To step down today

## 2013-02-25 NOTE — Progress Notes (Signed)
Subjective: Feels tired. Slightly SOB. Using [place]. Denies CP.   Objective: Vital signs in last 24 hours: Temp:  [97.7 F (36.5 C)-98.7 F (37.1 C)] 98 F (36.7 C) (12/25 0400) Pulse Rate:  [73-100] 77 (12/25 0600) Resp:  [12-26] 17 (12/25 0600) BP: (89-142)/(38-75) 110/48 mmHg (12/25 0600) SpO2:  [92 %-100 %] 98 % (12/25 0600) Last BM Date: 02/17/13  Intake/Output from previous day: 12/24 0701 - 12/25 0700 In: 960 [P.O.:480; I.V.:230; IV Piggyback:250] Out: 2450 [Urine:2450] Intake/Output this shift:    Medications Current Facility-Administered Medications  Medication Dose Route Frequency Provider Last Rate Last Dose  . 0.9 %  sodium chloride infusion   Intravenous PRN Glori Luis, MD      . 0.9 %  sodium chloride infusion   Intravenous PRN Glori Luis, MD 10 mL/hr at 02/23/13 1900    . acetaminophen (TYLENOL) tablet 650 mg  650 mg Oral Q4H PRN Tia Alert, MD       Or  . acetaminophen (TYLENOL) suppository 650 mg  650 mg Rectal Q4H PRN Tia Alert, MD      . atorvastatin (LIPITOR) tablet 80 mg  80 mg Oral q1800 [name], NP   80 mg at 02/24/13 1844  . digoxin (LANOXIN) tablet 0.25 mg  0.25 mg Oral Daily Dolores Patty, MD   0.25 mg at 02/24/13 1108  . DULoxetine (CYMBALTA) DR capsule 60 mg  60 mg Oral Daily Tia Alert, MD   60 mg at 02/24/13 1109  . glipiZIDE (GLUCOTROL XL) 24 hr tablet 10 mg  10 mg Oral BID WC Tia Alert, MD   10 mg at 02/24/13 1844  . insulin aspart (novoLOG) injection 0-15 Units  0-15 Units Subcutaneous TID WC Thurmon Fair, MD   3 Units at 02/24/13 1844  . insulin glargine (LANTUS) injection 20 Units  20 Units Subcutaneous q morning - 10a [name], NP   20 Units at 02/24/13 0820  . linagliptin (TRADJENTA) tablet 5 mg  5 mg Oral Daily Tia Alert, MD   5 mg at 02/24/13 1109  . menthol-cetylpyridinium (CEPACOL) lozenge 3 mg  1 lozenge Oral PRN Tia Alert, MD       Or  . phenol [facility]) mouth spray 1 spray  1  spray Mouth/Throat PRN Tia Alert, MD      . methocarbamol (ROBAXIN) tablet 500 mg  500 mg Oral Q6H PRN Tia Alert, MD   500 mg at 02/21/13 2205   Or  . methocarbamol (ROBAXIN) 500 mg in dextrose 5 % 50 mL IVPB  500 mg Intravenous Q6H PRN Tia Alert, MD   500 mg at 02/19/13 0849  . morphine 2 MG/ML injection 1-4 mg  1-4 mg Intravenous Q3H PRN Tia Alert, MD   2 mg at 02/22/13 1225  . norepinephrine (LEVOPHED) 16 mg in dextrose 5 % 250 mL infusion  2-50 mcg/min Intravenous Titrated Marykay Lex, MD   2.027 mcg/min at 02/23/13 1900  . ondansetron (ZOFRAN) injection 4 mg  4 mg Intravenous Q4H PRN Tia Alert, MD   4 mg at 02/23/13 1220  . oxyCODONE-acetaminophen (PERCOCET/ROXICET) 5-325 MG per tablet 1-2 tablet  1-2 tablet Oral Q4H PRN Tia Alert, MD   2 tablet at 02/25/13 0131  . potassium chloride 10 mEq in 50 mL *CENTRAL LINE* IVPB  10 mEq Intravenous Q1 Hr x 4 Lonia Farber, MD   10 mEq at 02/25/13 0659  .  spironolactone (ALDACTONE) tablet 12.5 mg  12.5 mg Oral Daily Dolores Patty, MD   12.5 mg at 02/24/13 1109  . vancomycin (VANCOCIN) 1,250 mg in sodium chloride 0.9 % 250 mL IVPB  1,250 mg Intravenous Q24H Marykay Lex, MD   1,250 mg at 02/24/13 1252    PE: General appearance: alert, cooperative and no distress Lungs: clear to auscultation bilaterally Heart: regular rate and rhythm, S1, S2 normal, no murmur, click, rub or gallop Extremities: no LEE Pulses: 2+ and symmetric Skin: warm and dry Neurologic: Grossly normal  Lab Results:   Recent Labs  02/23/13 0900  WBC 12.2*  HGB 12.3  HCT 35.2*  PLT 308   BMET  Recent Labs  02/23/13 2101 02/24/13 0500 02/25/13 0500  NA 134* 137 142  K 3.2* 3.2* 2.9*  CL 94* 97 107  CO2 29 31 25   GLUCOSE 199* 171* 63*  BUN 5* 5* 6  CREATININE 0.92 0.84 0.74  CALCIUM 6.5* 6.5* 5.4*    Assessment/Plan  Principal Problem:   ST elevation myocardial infarction (STEMI) of lateral wall Active  Problems:   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 Still feels weak. Hypokalemic with K of 2.9. She has already received 40 mg PO supplemental as well as 4 runs of IV. Mg was checked yesterday and was low at 1.3. I don't see that she has received magnesium supplementation, after reviewing her MAR. Will order IV mg to be given this AM. Will recheck potassium later today. Will also order CBC to ensure that WBC is trending downward. EKG w/o acute changes. HR stable. No longer hypotensive. Levophed has been discontinued. ? PT today and transfer to stepdown. MD to follow.     LOS: 7 days    Brittainy M. Delmer Islam 02/25/2013 7:44 AM  Agree with note written by Boyce Medici  [facility]  S/P cath revealing clean cors and LVD c/w Takasubo Syndrome. Was on pressors, now off. C/O some SOB but no CP. Exam benign. Labs remarkable for low K and Mg, being repleted. Worry especially since on Dig. NSR with lat TWI. BP better. Will need to add low dose coreg and adjust diuretics (only on low dose spironolactone), was on Maxide as an op. Tx tele. Re check labs and 2D to decide about LifeVest.   Runell Gess 02/25/2013 8:40 AM

## 2013-02-25 NOTE — Progress Notes (Signed)
  Echocardiogram 2D Echocardiogram has been performed.  Elvy Mclarty 02/25/2013, 11:13 AM

## 2013-02-26 LAB — BASIC METABOLIC PANEL
BUN: 8 mg/dL (ref 6–23)
CO2: 30 mEq/L (ref 19–32)
Calcium: 7.3 mg/dL — ABNORMAL LOW (ref 8.4–10.5)
Chloride: 98 mEq/L (ref 96–112)
Creatinine, Ser: 0.99 mg/dL (ref 0.50–1.10)
Glucose, Bld: 134 mg/dL — ABNORMAL HIGH (ref 70–99)
Sodium: 138 mEq/L (ref 135–145)

## 2013-02-26 LAB — GLUCOSE, CAPILLARY
Glucose-Capillary: 101 mg/dL — ABNORMAL HIGH (ref 70–99)
Glucose-Capillary: 140 mg/dL — ABNORMAL HIGH (ref 70–99)

## 2013-02-26 MED ORDER — LOSARTAN POTASSIUM 25 MG PO TABS
25.0000 mg | ORAL_TABLET | Freq: Every day | ORAL | Status: DC
  Administered 2013-02-26: 25 mg via ORAL
  Filled 2013-02-26 (×2): qty 1

## 2013-02-26 MED ORDER — HYDROCORTISONE 1 % EX CREA
TOPICAL_CREAM | Freq: Two times a day (BID) | CUTANEOUS | Status: DC
  Administered 2013-02-26 – 2013-02-27 (×4): via TOPICAL
  Filled 2013-02-26 (×2): qty 28

## 2013-02-26 MED ORDER — CARVEDILOL 3.125 MG PO TABS
3.1250 mg | ORAL_TABLET | Freq: Two times a day (BID) | ORAL | Status: DC
  Administered 2013-02-26: 3.125 mg via ORAL
  Filled 2013-02-26 (×4): qty 1

## 2013-02-26 NOTE — Progress Notes (Signed)
Physical Therapy Treatment Patient Details Name: Sarah Flores MRN: [id number] DOB: 06-May-1945 Today's Date: 02/26/2013 Time: 4098-1191 PT Time Calculation (min): 29 min  PT Assessment / Plan / Recommendation  History of Present Illness 67 yo s/p PLIF L5- S1 12/18 with STEMI 12/19   PT Comments   Pt admitted with above. Pt currently with functional limitations due to balance and endurance deficits.  Pt incr distance with ambulation today.  Pt will benefit from skilled PT to increase their independence and safety with mobility to allow discharge to the venue listed below.   Follow Up Recommendations  Supervision for mobility/OOB;Home health PT                 Equipment Recommendations  None recommended by PT        Frequency Min 5X/week   Progress towards PT Goals Progress towards PT goals: Progressing toward goals  Plan Current plan remains appropriate    Precautions / Restrictions Precautions Precautions: Back Precaution Booklet Issued: Yes (comment) Precaution Comments: educated and reviewed via teachback and handout Required Braces or Orthoses: Spinal Brace Spinal Brace: Lumbar corset;Applied in sitting position Restrictions Weight Bearing Restrictions: No   Pertinent Vitals/Pain VSS, some c/o spasms in back, nursing notified and to ask pt re: meds.    Mobility  Bed Mobility Bed Mobility: Sit to Sidelying Right Right Sidelying to Sit: Not tested (comment) Sitting - Scoot to Edge of Bed: Not tested (comment) Sit to Sidelying Right: 4: Min guard;HOB flat Details for Bed Mobility Assistance: cueing for posture and precautions with sequence Transfers Transfers: Sit to Stand;Stand to Sit Sit to Stand: 5: Supervision;With upper extremity assist;With armrests;From chair/3-in-1 Stand to Sit: 5: Supervision;With upper extremity assist;To bed Details for Transfer Assistance: cues for posture and hand placement Ambulation/Gait Ambulation/Gait Assistance: 4: Min  guard Ambulation Distance (Feet): 125 Feet Assistive device: Rolling walker Ambulation/Gait Assistance Details: Cues for sequencing steps and RW with occasional assist for steering RW.   Gait Pattern: Step-through pattern;Decreased stride length General Gait Details: overall steady with mobility Stairs: No Wheelchair Mobility Wheelchair Mobility: No    Exercises General Exercises - Lower Extremity Ankle Circles/Pumps: AROM;Both;5 reps;Supine Long Arc Quad: AROM;Both;5 reps;Seated    PT Goals (current goals can now be found in the care plan section)    Visit Information  Last PT Received On: 02/26/13 Assistance Needed: +1 History of Present Illness: 67 yo s/p PLIF L5- S1 12/18 with STEMI 12/19    Subjective Data  Subjective: "I have a catch in my back."   Cognition  Cognition Arousal/Alertness: Awake/alert Behavior During Therapy: WFL for tasks assessed/performed Overall Cognitive Status: Within Functional Limits for tasks assessed    Balance  High Level Balance High Level Balance Activites: Side stepping;Backward walking;Direction changes;Turns High Level Balance Comments: supervision   End of Session PT - End of Session Equipment Utilized During Treatment: Gait belt;Back brace Activity Tolerance: Patient tolerated treatment well;Patient limited by fatigue Patient left: in bed;with call bell/phone within reach;with family/visitor present Nurse Communication: Mobility status;Precautions       INGOLD,Khalik Pewitt 02/26/2013, 2:53 PM [facility] Acute Rehabilitation [phone] (pager)

## 2013-02-26 NOTE — Progress Notes (Signed)
Subjective:  No CP/SOB  Objective:  Temp:  [97.8 F (36.6 C)-98.7 F (37.1 C)] 98.1 F (36.7 C) (12/26 0738) Pulse Rate:  [34-82] 76 (12/26 0420) Resp:  [13-18] 16 (12/26 0420) BP: (108-132)/(45-63) 108/51 mmHg (12/26 0738) SpO2:  [94 %-100 %] 99 % (12/26 0738) Weight change:   Intake/Output from previous day: 12/25 0701 - 12/26 0700 In: 1170 [P.O.:720; IV Piggyback:450] Out: 1250 [Urine:1250]  Intake/Output from this shift:    Physical Exam: General appearance: alert and no distress Neck: no adenopathy, no carotid bruit, no JVD, supple, symmetrical, trachea midline and thyroid not enlarged, symmetric, no tenderness/mass/nodules Lungs: clear to auscultation bilaterally Heart: regular rate and rhythm, S1, S2 normal, no murmur, click, rub or gallop Extremities: extremities normal, atraumatic, no cyanosis or edema  Lab Results: Results for orders placed during the hospital encounter of 02/18/13 (from the past 48 hour(s))  GLUCOSE, CAPILLARY     Status: Abnormal   Collection Time    02/24/13 12:48 PM      Result Value Range   Glucose-Capillary 192 (*) 70 - 99 mg/dL  GLUCOSE, CAPILLARY     Status: Abnormal   Collection Time    02/24/13  4:30 PM      Result Value Range   Glucose-Capillary 153 (*) 70 - 99 mg/dL  GLUCOSE, CAPILLARY     Status: None   Collection Time    02/24/13 10:23 PM      Result Value Range   Glucose-Capillary 80  70 - 99 mg/dL  BASIC METABOLIC PANEL     Status: Abnormal   Collection Time    02/25/13  5:00 AM      Result Value Range   Sodium 142  135 - 145 mEq/L   Potassium 2.9 (*) 3.5 - 5.1 mEq/L   Chloride 107  96 - 112 mEq/L   CO2 25  19 - 32 mEq/L   Glucose, Bld 63 (*) 70 - 99 mg/dL   BUN 6  6 - 23 mg/dL   Creatinine, Ser 1.61  0.50 - 1.10 mg/dL   Calcium 5.4 (*) 8.4 - 10.5 mg/dL   Comment: CRITICAL RESULT CALLED TO, READ BACK BY AND VERIFIED WITH:     MERLINI S,RN 02/25/13 0602 WAYK   GFR calc non Af Amer 87 (*) >90 mL/min   GFR  calc Af Amer >90  >90 mL/min   Comment: (NOTE)     The eGFR has been calculated using the CKD EPI equation.     This calculation has not been validated in all clinical situations.     eGFR's persistently <90 mL/min signify possible Chronic Kidney     Disease.  GLUCOSE, CAPILLARY     Status: None   Collection Time    02/25/13  7:59 AM      Result Value Range   Glucose-Capillary 82  70 - 99 mg/dL  CBC     Status: Abnormal   Collection Time    02/25/13  9:30 AM      Result Value Range   WBC 9.2  4.0 - 10.5 K/uL   RBC 3.19 (*) 3.87 - 5.11 MIL/uL   Hemoglobin 10.7 (*) 12.0 - 15.0 g/dL   HCT 09.6 (*) 04.5 - 40.9 %   MCV 95.3  78.0 - 100.0 fL   MCH 33.5  26.0 - 34.0 pg   MCHC 35.2  30.0 - 36.0 g/dL   RDW 81.1  91.4 - 78.2 %   Platelets 254  150 - 400 K/uL  GLUCOSE, CAPILLARY     Status: Abnormal   Collection Time    02/25/13 11:47 AM      Result Value Range   Glucose-Capillary 121 (*) 70 - 99 mg/dL  POTASSIUM     Status: None   Collection Time    02/25/13 12:40 PM      Result Value Range   Potassium 3.6  3.5 - 5.1 mEq/L  MAGNESIUM     Status: None   Collection Time    02/25/13 12:40 PM      Result Value Range   Magnesium 1.8  1.5 - 2.5 mg/dL  GLUCOSE, CAPILLARY     Status: Abnormal   Collection Time    02/25/13  5:01 PM      Result Value Range   Glucose-Capillary 200 (*) 70 - 99 mg/dL  GLUCOSE, CAPILLARY     Status: Abnormal   Collection Time    02/25/13  9:06 PM      Result Value Range   Glucose-Capillary 123 (*) 70 - 99 mg/dL  BASIC METABOLIC PANEL     Status: Abnormal   Collection Time    02/26/13  4:50 AM      Result Value Range   Sodium 138  135 - 145 mEq/L   Potassium 4.1  3.5 - 5.1 mEq/L   Chloride 98  96 - 112 mEq/L   CO2 30  19 - 32 mEq/L   Glucose, Bld 134 (*) 70 - 99 mg/dL   BUN 8  6 - 23 mg/dL   Creatinine, Ser 1.61  0.50 - 1.10 mg/dL   Calcium 7.3 (*) 8.4 - 10.5 mg/dL   GFR calc non Af Amer 59 (*) >90 mL/min   GFR calc Af Amer 68 (*) >90 mL/min    Comment: (NOTE)     The eGFR has been calculated using the CKD EPI equation.     This calculation has not been validated in all clinical situations.     eGFR's persistently <90 mL/min signify possible Chronic Kidney     Disease.    Imaging: Imaging results have been reviewed  Assessment/Plan:   1. Principal Problem: 2.   ST elevation myocardial infarction (STEMI) of lateral wall 3. Active Problems: 4.   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   HTN (hypertension) 6.   Dyslipidemia, goal LDL below 70 7.   Diabetes 8.   Hypotension due to drugs 9.   Cardiogenic shock 10.   Cardiomyopathy, postop stress  11.   Takotsubo cardiomyopathy 12.   Time Spent Directly with Patient:  20 minutes  Length of Stay:  LOS: 8 days   Looks much better this AM. VSS. Off pressors. Up in a chair eating.Exam benign.  Electrolytes improved. 2D done yesterday, results pending. Will start low dose BB, ACE-I, Tx tele tomorrow. [facility]. Prob home first of week. Will decide on LifeVest based on 2D EF  BERRY,JONATHAN J 02/26/2013, 8:18 AM

## 2013-02-26 NOTE — Progress Notes (Signed)
CARDIAC REHAB PHASE I   PRE:  Rate/Rhythm: 98 SR  BP:  Sitting: 139/68     SaO2: 98 RA  MODE:  Ambulation: 104 ft   POST:  Rate/Rhythm: 86 SR with PVC  BP:  Sitting: 126/47    SaO2:   Pt walked 104 ft with assist x1, RW and frequent rest breaks.  Pt had a very slow pace with c/o of back pain and knee pain.  Pt also c/o of fatigue.  Reviewed and completed education with pt and husband.  Explained NTG use and pt is interested in CRP II in [place] when her back is healed.  [id number] Marvene Staff MS, ACSM RCEP 10:11 AM 02/26/2013

## 2013-02-27 LAB — BASIC METABOLIC PANEL
CO2: 30 mEq/L (ref 19–32)
Calcium: 7.9 mg/dL — ABNORMAL LOW (ref 8.4–10.5)
Chloride: 95 mEq/L — ABNORMAL LOW (ref 96–112)
GFR calc non Af Amer: 62 mL/min — ABNORMAL LOW (ref >90)
Glucose, Bld: 148 mg/dL — ABNORMAL HIGH (ref 70–99)
Potassium: 4.4 mEq/L (ref 3.5–5.1)
Sodium: 135 mEq/L (ref 135–145)

## 2013-02-27 LAB — CULTURE, BLOOD (ROUTINE X 2)
Culture: NO GROWTH
Culture: NO GROWTH

## 2013-02-27 LAB — GLUCOSE, CAPILLARY
Glucose-Capillary: 169 mg/dL — ABNORMAL HIGH (ref 70–99)
Glucose-Capillary: 226 mg/dL — ABNORMAL HIGH (ref 70–99)

## 2013-02-27 LAB — MAGNESIUM: Magnesium: 1.7 mg/dL (ref 1.5–2.5)

## 2013-02-27 MED ORDER — LOSARTAN POTASSIUM 50 MG PO TABS
50.0000 mg | ORAL_TABLET | Freq: Every day | ORAL | Status: DC
  Administered 2013-02-27 – 2013-02-28 (×2): 50 mg via ORAL
  Filled 2013-02-27 (×2): qty 1

## 2013-02-27 MED ORDER — CARVEDILOL 6.25 MG PO TABS
6.2500 mg | ORAL_TABLET | Freq: Two times a day (BID) | ORAL | Status: DC
  Administered 2013-02-27 – 2013-02-28 (×3): 6.25 mg via ORAL
  Filled 2013-02-27 (×5): qty 1

## 2013-02-27 NOTE — Progress Notes (Signed)
Patient ID: Sarah Flores, female   DOB: 14-Jan-1946, 67 y.o.   MRN: [id number] BP 125/64  Pulse 75  Temp(Src) 98.6 F (37 C) (Oral)  Resp 19  Ht 5\' 3"  (1.6 m)  Wt 75 kg (165 lb 5.5 oz)  BMI 29.30 kg/m2  SpO2 97% Alert and oriented x 4 Moving lower extremities well Wound with rash, no signs of infection Strength is full Ok for discharge

## 2013-02-27 NOTE — Progress Notes (Addendum)
Physical Therapy Treatment Patient Details Name: NADIRAH SOCORRO MRN: [id number] DOB: 1946/02/05 Today's Date: 02/27/2013 Time: 0815-0900 PT Time Calculation (min): 45 min  PT Assessment / Plan / Recommendation  History of Present Illness 67 yo s/p PLIF L5- S1 12/18 with STEMI 12/19   PT Comments   Pt admitted with above. Pt currently with functional limitations due to weakness and endurance deficits.  Pt will benefit from skilled PT to increase their independence and safety with mobility to allow discharge to the venue listed below.  MD: Pt inquiring about showering.  Please advise as to technique you desire for her to use during showering.  Thanks.  Follow Up Recommendations  Supervision for mobility/OOB;Home health PT                 Equipment Recommendations  None recommended by PT        Frequency Min 5X/week   Progress towards PT Goals Progress towards PT goals: Progressing toward goals  Plan Current plan remains appropriate    Precautions / Restrictions Precautions Precautions: Back Precaution Booklet Issued: Yes (comment) (gave pt Sex and back pain book) Precaution Comments: educated and reviewed via teachback and handout.  Pt verbalizes 3/3 precautions.  Required Braces or Orthoses: Spinal Brace Spinal Brace: Lumbar corset;Applied in sitting position Restrictions Weight Bearing Restrictions: No   Pertinent Vitals/Pain VSS, some soreness in back    Mobility  Bed Mobility Bed Mobility: Not assessed Transfers Transfers: Sit to Stand;Stand to Sit Sit to Stand: 5: Supervision;With upper extremity assist;With armrests;From chair/3-in-1 Stand to Sit: 5: Supervision;With upper extremity assist;To bed Details for Transfer Assistance: cues for posture and hand placement.  Educated pt re: sock aid, reacher, long handled bath sponge and long handled shoe horn- ADL kit.  They know they can get this equipment in the gift shop.   Ambulation/Gait Ambulation/Gait Assistance:  5: Supervision Ambulation Distance (Feet): 600 Feet Assistive device: Rolling walker Ambulation/Gait Assistance Details: No cues needed.  Good walker safety.  Instructed pt she can ambulate in halls with husband.  Gait Pattern: Step-through pattern;Decreased stride length General Gait Details: overall steady with mobility Stairs: No Wheelchair Mobility Wheelchair Mobility: No     PT Goals (current goals can now be found in the care plan section)    Visit Information  Last PT Received On: 02/27/13 Assistance Needed: +1 History of Present Illness: 67 yo s/p PLIF L5- S1 12/18 with STEMI 12/19    Subjective Data  Subjective: "My back feels better but is just sore."   Cognition  Cognition Arousal/Alertness: Awake/alert Behavior During Therapy: WFL for tasks assessed/performed    Balance  High Level Balance High Level Balance Activites: Side stepping;Backward walking;Direction changes;Turns High Level Balance Comments: supervision   End of Session PT - End of Session Equipment Utilized During Treatment: Gait belt;Back brace Activity Tolerance: Patient tolerated treatment well;Patient limited by fatigue Patient left: in chair;with call bell/phone within reach;with family/visitor present Nurse Communication: Mobility status;Precautions        INGOLD,Casey Fye 02/27/2013, 10:28 AM  [facility] [phone] (pager)

## 2013-02-27 NOTE — Progress Notes (Signed)
Subjective:  No CP/SOB, ambulating with [facility]  Objective:  Temp:  [97.7 F (36.5 C)-99 F (37.2 C)] 97.7 F (36.5 C) (12/27 0741) Pulse Rate:  [72] 72 (12/27 0741) Resp:  [18-20] 20 (12/27 0741) BP: (109-137)/(47-66) 134/62 mmHg (12/27 0741) SpO2:  [95 %-100 %] 97 % (12/27 0741) Weight change:   Intake/Output from previous day: 12/26 0701 - 12/27 0700 In: 970 [P.O.:720; IV Piggyback:250] Out: -   Intake/Output from this shift:    Physical Exam: General appearance: alert and no distress Neck: no adenopathy, no carotid bruit, no JVD, supple, symmetrical, trachea midline and thyroid not enlarged, symmetric, no tenderness/mass/nodules Lungs: clear to auscultation bilaterally Heart: regular rate and rhythm, S1, S2 normal, no murmur, click, rub or gallop Extremities: extremities normal, atraumatic, no cyanosis or edema  Lab Results: Results for orders placed during the hospital encounter of 02/18/13 (from the past 48 hour(s))  GLUCOSE, CAPILLARY     Status: None   Collection Time    02/25/13  7:59 AM      Result Value Range   Glucose-Capillary 82  70 - 99 mg/dL  CBC     Status: Abnormal   Collection Time    02/25/13  9:30 AM      Result Value Range   WBC 9.2  4.0 - 10.5 K/uL   RBC 3.19 (*) 3.87 - 5.11 MIL/uL   Hemoglobin 10.7 (*) 12.0 - 15.0 g/dL   HCT 04.5 (*) 40.9 - 81.1 %   MCV 95.3  78.0 - 100.0 fL   MCH 33.5  26.0 - 34.0 pg   MCHC 35.2  30.0 - 36.0 g/dL   RDW 91.4  78.2 - 95.6 %   Platelets 254  150 - 400 K/uL  GLUCOSE, CAPILLARY     Status: Abnormal   Collection Time    02/25/13 11:47 AM      Result Value Range   Glucose-Capillary 121 (*) 70 - 99 mg/dL  POTASSIUM     Status: None   Collection Time    02/25/13 12:40 PM      Result Value Range   Potassium 3.6  3.5 - 5.1 mEq/L  MAGNESIUM     Status: None   Collection Time    02/25/13 12:40 PM      Result Value Range   Magnesium 1.8  1.5 - 2.5 mg/dL  GLUCOSE, CAPILLARY     Status: Abnormal   Collection Time    02/25/13  5:01 PM      Result Value Range   Glucose-Capillary 200 (*) 70 - 99 mg/dL  GLUCOSE, CAPILLARY     Status: Abnormal   Collection Time    02/25/13  9:06 PM      Result Value Range   Glucose-Capillary 123 (*) 70 - 99 mg/dL  BASIC METABOLIC PANEL     Status: Abnormal   Collection Time    02/26/13  4:50 AM      Result Value Range   Sodium 138  135 - 145 mEq/L   Potassium 4.1  3.5 - 5.1 mEq/L   Chloride 98  96 - 112 mEq/L   CO2 30  19 - 32 mEq/L   Glucose, Bld 134 (*) 70 - 99 mg/dL   BUN 8  6 - 23 mg/dL   Creatinine, Ser 2.13  0.50 - 1.10 mg/dL   Calcium 7.3 (*) 8.4 - 10.5 mg/dL   GFR calc non Af Amer 59 (*) >90 mL/min   GFR calc  Af Amer 68 (*) >90 mL/min   Comment: (NOTE)     The eGFR has been calculated using the CKD EPI equation.     This calculation has not been validated in all clinical situations.     eGFR's persistently <90 mL/min signify possible Chronic Kidney     Disease.  GLUCOSE, CAPILLARY     Status: None   Collection Time    02/26/13  7:41 AM      Result Value Range   Glucose-Capillary 98  70 - 99 mg/dL  GLUCOSE, CAPILLARY     Status: Abnormal   Collection Time    02/26/13 12:36 PM      Result Value Range   Glucose-Capillary 170 (*) 70 - 99 mg/dL  GLUCOSE, CAPILLARY     Status: Abnormal   Collection Time    02/26/13  4:43 PM      Result Value Range   Glucose-Capillary 101 (*) 70 - 99 mg/dL  GLUCOSE, CAPILLARY     Status: Abnormal   Collection Time    02/26/13  9:18 PM      Result Value Range   Glucose-Capillary 140 (*) 70 - 99 mg/dL  BASIC METABOLIC PANEL     Status: Abnormal   Collection Time    02/27/13  5:57 AM      Result Value Range   Sodium 135  135 - 145 mEq/L   Potassium 4.4  3.5 - 5.1 mEq/L   Chloride 95 (*) 96 - 112 mEq/L   CO2 30  19 - 32 mEq/L   Glucose, Bld 148 (*) 70 - 99 mg/dL   BUN 9  6 - 23 mg/dL   Creatinine, Ser 1.61  0.50 - 1.10 mg/dL   Calcium 7.9 (*) 8.4 - 10.5 mg/dL   GFR calc non Af Amer 62 (*)  >90 mL/min   GFR calc Af Amer 71 (*) >90 mL/min   Comment: (NOTE)     The eGFR has been calculated using the CKD EPI equation.     This calculation has not been validated in all clinical situations.     eGFR's persistently <90 mL/min signify possible Chronic Kidney     Disease.  MAGNESIUM     Status: None   Collection Time    02/27/13  5:57 AM      Result Value Range   Magnesium 1.7  1.5 - 2.5 mg/dL    Imaging: Imaging results have been reviewed  Assessment/Plan:   1. Principal Problem: 2.   ST elevation myocardial infarction (STEMI) of lateral wall 3. Active Problems: 4.   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   HTN (hypertension) 6.   Dyslipidemia, goal LDL below 70 7.   Diabetes 8.   Hypotension due to drugs 9.   Cardiogenic shock 10.   Cardiomyopathy, postop stress  11.   Takotsubo cardiomyopathy 12.   Time Spent Directly with Patient:  20 minutes  Length of Stay:  LOS: 9 days   Looks great today. VSS. Exam benign. Labs OK. Denies CP/SOB. 2D shows marked improvement in LV fxn. Titrate meds. Tx to tele. Prob home tomorrow.  Runell Gess 02/27/2013, 7:58 AM

## 2013-02-27 NOTE — Progress Notes (Signed)
CARDIAC REHAB PHASE I   PRE:  Rate/Rhythm: Sinus  BP:  Supine: 130/65     SaO2: 95% Room Air  MODE:  Ambulation: 600 ft   POST:  Rate/Rhythem: Sinus 94  BP:    Sitting: 136/79     SaO2: 99 Room Air  1228 -1250Patient ambulated in the hallway using a rolling walker and assistance  times one. Patient increased distance. Tolerated ambulation without complaints or difficulty. Patient is wearing back brace. Assisted patient to recliner with call light within reach. Reinforced low sodium diet choices and the importance of daily weights.   Whitaker, Arta Bruce RN BSN

## 2013-02-28 LAB — BASIC METABOLIC PANEL
BUN: 11 mg/dL (ref 6–23)
CO2: 29 mEq/L (ref 19–32)
Chloride: 94 mEq/L — ABNORMAL LOW (ref 96–112)
Creatinine, Ser: 0.9 mg/dL (ref 0.50–1.10)
GFR calc non Af Amer: 66 mL/min — ABNORMAL LOW (ref >90)
Glucose, Bld: 140 mg/dL — ABNORMAL HIGH (ref 70–99)
Potassium: 3.6 mEq/L (ref 3.5–5.1)

## 2013-02-28 LAB — GLUCOSE, CAPILLARY: Glucose-Capillary: 217 mg/dL — ABNORMAL HIGH (ref 70–99)

## 2013-02-28 MED ORDER — METHOCARBAMOL 500 MG PO TABS: 500.0000 mg | ORAL_TABLET | Freq: Four times a day (QID) | ORAL | Status: DC | PRN

## 2013-02-28 MED ORDER — CARVEDILOL 6.25 MG PO TABS: 6.2500 mg | ORAL_TABLET | Freq: Two times a day (BID) | ORAL | Status: DC

## 2013-02-28 MED ORDER — HYDROCORTISONE 1 % EX CREA: TOPICAL_CREAM | Freq: Two times a day (BID) | CUTANEOUS | Status: DC

## 2013-02-28 MED ORDER — DIGOXIN 250 MCG PO TABS: 0.2500 mg | ORAL_TABLET | Freq: Every day | ORAL | Status: DC

## 2013-02-28 MED ORDER — HYDROCODONE-ACETAMINOPHEN 7.5-325 MG PO TABS: 1.0000 | ORAL_TABLET | Freq: Four times a day (QID) | ORAL | Status: DC | PRN

## 2013-02-28 MED ORDER — LOSARTAN POTASSIUM 50 MG PO TABS: 50.0000 mg | ORAL_TABLET | Freq: Every day | ORAL | Status: DC

## 2013-02-28 MED ORDER — ATORVASTATIN CALCIUM 80 MG PO TABS: 80.0000 mg | ORAL_TABLET | Freq: Every day | ORAL | Status: DC

## 2013-02-28 NOTE — Consult Note (Signed)
Spoke to patient and she stated that she already has walker and commode at home.

## 2013-02-28 NOTE — Progress Notes (Signed)
Subjective:  No CP/SOB  Objective:  Temp:  [97.7 F (36.5 C)-99.1 F (37.3 C)] 97.7 F (36.5 C) (12/28 0500) Pulse Rate:  [71-75] 71 (12/28 0500) Resp:  [16-20] 16 (12/28 0500) BP: (106-125)/(48-64) 108/60 mmHg (12/28 0500) SpO2:  [97 %-98 %] 97 % (12/28 0500) Weight change:   Intake/Output from previous day: 12/27 0701 - 12/28 0700 In: 480 [P.O.:480] Out: -   Intake/Output from this shift:    Physical Exam: General appearance: alert and no distress Neck: no adenopathy, no carotid bruit, no JVD, supple, symmetrical, trachea midline and thyroid not enlarged, symmetric, no tenderness/mass/nodules Lungs: clear to auscultation bilaterally Heart: regular rate and rhythm, S1, S2 normal, no murmur, click, rub or gallop Extremities: extremities normal, atraumatic, no cyanosis or edema  Lab Results: Results for orders placed during the hospital encounter of 02/18/13 (from the past 48 hour(s))  GLUCOSE, CAPILLARY     Status: Abnormal   Collection Time    02/26/13 12:36 PM      Result Value Range   Glucose-Capillary 170 (*) 70 - 99 mg/dL  GLUCOSE, CAPILLARY     Status: Abnormal   Collection Time    02/26/13  4:43 PM      Result Value Range   Glucose-Capillary 101 (*) 70 - 99 mg/dL  GLUCOSE, CAPILLARY     Status: Abnormal   Collection Time    02/26/13  9:18 PM      Result Value Range   Glucose-Capillary 140 (*) 70 - 99 mg/dL  BASIC METABOLIC PANEL     Status: Abnormal   Collection Time    02/27/13  5:57 AM      Result Value Range   Sodium 135  135 - 145 mEq/L   Potassium 4.4  3.5 - 5.1 mEq/L   Chloride 95 (*) 96 - 112 mEq/L   CO2 30  19 - 32 mEq/L   Glucose, Bld 148 (*) 70 - 99 mg/dL   BUN 9  6 - 23 mg/dL   Creatinine, Ser 1.61  0.50 - 1.10 mg/dL   Calcium 7.9 (*) 8.4 - 10.5 mg/dL   GFR calc non Af Amer 62 (*) >90 mL/min   GFR calc Af Amer 71 (*) >90 mL/min   Comment: (NOTE)     The eGFR has been calculated using the CKD EPI equation.     This calculation  has not been validated in all clinical situations.     eGFR's persistently <90 mL/min signify possible Chronic Kidney     Disease.  MAGNESIUM     Status: None   Collection Time    02/27/13  5:57 AM      Result Value Range   Magnesium 1.7  1.5 - 2.5 mg/dL  GLUCOSE, CAPILLARY     Status: Abnormal   Collection Time    02/27/13  7:44 AM      Result Value Range   Glucose-Capillary 169 (*) 70 - 99 mg/dL  GLUCOSE, CAPILLARY     Status: Abnormal   Collection Time    02/27/13 11:53 AM      Result Value Range   Glucose-Capillary 226 (*) 70 - 99 mg/dL  GLUCOSE, CAPILLARY     Status: None   Collection Time    02/27/13  4:46 PM      Result Value Range   Glucose-Capillary 94  70 - 99 mg/dL  GLUCOSE, CAPILLARY     Status: Abnormal   Collection Time    02/27/13  8:36 PM      Result Value Range   Glucose-Capillary 165 (*) 70 - 99 mg/dL  BASIC METABOLIC PANEL     Status: Abnormal   Collection Time    02/28/13  5:50 AM      Result Value Range   Sodium 134 (*) 135 - 145 mEq/L   Potassium 3.6  3.5 - 5.1 mEq/L   Comment: DELTA CHECK NOTED   Chloride 94 (*) 96 - 112 mEq/L   CO2 29  19 - 32 mEq/L   Glucose, Bld 140 (*) 70 - 99 mg/dL   BUN 11  6 - 23 mg/dL   Creatinine, Ser 4.09  0.50 - 1.10 mg/dL   Calcium 8.2 (*) 8.4 - 10.5 mg/dL   GFR calc non Af Amer 66 (*) >90 mL/min   GFR calc Af Amer 76 (*) >90 mL/min   Comment: (NOTE)     The eGFR has been calculated using the CKD EPI equation.     This calculation has not been validated in all clinical situations.     eGFR's persistently <90 mL/min signify possible Chronic Kidney     Disease.  GLUCOSE, CAPILLARY     Status: Abnormal   Collection Time    02/28/13  7:44 AM      Result Value Range   Glucose-Capillary 165 (*) 70 - 99 mg/dL    Imaging: Imaging results have been reviewed  Assessment/Plan:   1. Principal Problem: 2.   ST elevation myocardial infarction (STEMI) of lateral wall 3. Active Problems: 4.   S/P lumbar spinal fusion  02/18/13 5.   HTN (hypertension) 6.   Dyslipidemia, goal LDL below 70 7.   Diabetes 8.   Hypotension due to drugs 9.   Cardiogenic shock 10.   Cardiomyopathy, postop stress  11.   Takotsubo cardiomyopathy 12.   Time Spent Directly with Patient:  20 minutes  Length of Stay:  LOS: 10 days   OK for D/C. Exam benign. Labs OK. Meds adjusted. ROV with Dr. Herbie Baltimore as OP  Sarah Flores 02/28/2013, 8:23 AM

## 2013-02-28 NOTE — Discharge Summary (Signed)
Physician Discharge Summary  Patient ID: Sarah Flores MRN: [id number] DOB/AGE: Feb 01, 1946 67 y.o.  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  Which was negative  - pt doing much better, ambulating  -   Consults: cardiology  Significant Diagnostic Studies: angiography: cardiac  Treatments: PROCEDURE:  1. Decompressive lumbar laminectomy L5-S1 requiring more work than would be required for a simple exposure of the disk for PLIF in order to adequately decompress the neural elements and address the spinal stenosis  2. Posterior lumbar interbody fusion L5-S1 using PEEK interbody cages packed with morcellized allograft and autograft  3. Posterior fixation L5-S1 using NUvasive cortical pedicle screws.      Discharge Exam: Blood pressure 108/60, pulse 71, temperature 97.7 F (36.5 C), temperature source Oral, resp. rate 16, height 5\' 3"  (1.6 m), weight 75 kg (165 lb 5.5 oz), SpO2 97.00%. Wound:c/d/i  Disposition: home  Discharge Orders   Future Orders Complete By Expires   Amb Referral to Cardiac  Rehabilitation  As directed    Comments:     [place]       Medication List         atorvastatin 80 MG tablet  Commonly known as:  LIPITOR  Take 1 tablet (80 mg total) by mouth daily at 6 PM.     carvedilol 6.25 MG tablet  Commonly known as:  COREG  Take 1 tablet (6.25 mg total) by mouth 2 (two) times daily with a meal.     digoxin 0.25 MG tablet  Commonly known as:  LANOXIN  Take 1 tablet (0.25 mg total) by mouth daily.     DULoxetine 60 MG capsule  Commonly known as:  CYMBALTA  Take 60 mg by mouth daily.     glipiZIDE 10 MG 24 hr tablet  Commonly known as:  GLUCOTROL XL  Take 10 mg by mouth 2 (two) times daily.     HYDROcodone-acetaminophen 7.5-325 MG per tablet  Commonly known as:  NORCO  Take 1 tablet by mouth every 6 (six) hours as needed (for pain).     hydrocortisone cream 1 %  Apply topically 2 (two) times daily.     losartan 50 MG tablet  Commonly known as:  COZAAR  Take 1 tablet (50 mg total) by mouth daily.     metFORMIN 1000 MG tablet  Commonly known as:  GLUCOPHAGE  Take 1,000 mg by mouth 2 (two) times daily with a meal.     methocarbamol 500 MG tablet  Commonly known as:  ROBAXIN  Take 1 tablet (500 mg total) by mouth every 6 (six) hours as needed for muscle spasms.     metoprolol 50 MG tablet  Commonly known  as:  LOPRESSOR  Take 50 mg by mouth daily.     sitaGLIPtin 100 MG tablet  Commonly known as:  JANUVIA  Take 100 mg by mouth daily.     triamterene-hydrochlorothiazide 37.5-25 MG per tablet  Commonly known as:  MAXZIDE-25  Take 1 tablet by mouth daily.     Vitamin D (Ergocalciferol) 50000 UNITS Caps capsule  Commonly known as:  DRISDOL  Take 50,000 Units by mouth every 7 (seven) days. Wednesdays         Signed: Clydene Fake, MD 02/28/2013, 9:07 AM

## 2013-03-25 ENCOUNTER — Ambulatory Visit (INDEPENDENT_AMBULATORY_CARE_PROVIDER_SITE_OTHER): Payer: Medicare Other | Admitting: Cardiology

## 2013-03-25 ENCOUNTER — Encounter: Payer: Self-pay | Admitting: Cardiology

## 2013-03-25 VITALS — BP 130/74 | HR 70 | Ht 63.0 in | Wt 146.3 lb

## 2013-03-25 DIAGNOSIS — R079 Chest pain, unspecified: Secondary | ICD-10-CM

## 2013-03-25 DIAGNOSIS — I2129 ST elevation (STEMI) myocardial infarction involving other sites: Secondary | ICD-10-CM

## 2013-03-25 DIAGNOSIS — I10 Essential (primary) hypertension: Secondary | ICD-10-CM

## 2013-03-25 DIAGNOSIS — E785 Hyperlipidemia, unspecified: Secondary | ICD-10-CM

## 2013-03-25 DIAGNOSIS — I5181 Takotsubo syndrome: Secondary | ICD-10-CM

## 2013-03-25 MED ORDER — NITROGLYCERIN 0.4 MG SL SUBL
0.4000 mg | SUBLINGUAL_TABLET | SUBLINGUAL | Status: DC | PRN
Start: 2013-03-25 — End: 2018-07-03

## 2013-03-25 MED ORDER — CARVEDILOL 6.25 MG PO TABS: 6.2500 mg | ORAL_TABLET | Freq: Two times a day (BID) | ORAL | Status: DC

## 2013-03-25 NOTE — Patient Instructions (Signed)
Stop digoxin  Stop metoprolol   Continue Coreg  Your physician wants you to follow-up in 3-4 months Dr Ellyn Hack. You will receive a reminder letter in the mail two months in advance. If you don't receive a letter, please call our office to schedule the follow-up appointment.

## 2013-03-27 ENCOUNTER — Encounter: Payer: Self-pay | Admitting: Cardiology

## 2013-03-27 DIAGNOSIS — R079 Chest pain, unspecified: Secondary | ICD-10-CM | POA: Insufficient documentation

## 2013-03-27 NOTE — Assessment & Plan Note (Signed)
This was a transient ST elevation on ECG. She had positive troponins, but no evidence of ischemic coronary disease. 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 She still having some mild discomfort in her chest, I wonder she has coronary artery spasm that could have been triggered by postoperative stress.  Plan: Add when necessary nitroglycerin. Continue statin, beta blocker and ARB.

## 2013-03-27 NOTE — Progress Notes (Signed)
PATIENT: Sarah Flores MRN: [id number]  DOB: 1945-12-16   DOV:03/27/2013 PCP: Gilford Rile, MD  Clinic Note: Chief Complaint  Patient presents with  . Hospitalization Follow-up    C/o chest pain-felt pressure and "felt like she was floating," occas shortness of breath, no energy, lft ankle edema, lightheadedness when she gets up.  . Medication    Hasn't taken Digoxin    HPI: Sarah Flores is a 68 y.o.  female with a PMH below who presents today for hospital followup after an episode of postoperative stress induced cardiomyopathy. I met her as a consult on December 19, when she was postop day 1 status post  L5-S1 fusion by Dr. Sherley Bounds. She just completed a session with rehabilitation/VT, and started to have severe crushing substernal chest pain. Represent pontine cane an ECG was demonstrating ST elevations in the lateral leads. I happened to be just down the hall I went to see the patient. She was initially very hypertensive. She was given 2 doses of nitroglycerin as well as 4 baby aspirin pressure subsided and her blood pressure plummeted. However, her ST elevations improved. 2 transfer to CCU monitoring and end up being treated for cardiac shock for short-term on vasopressors.  She was initially treated with IV fluid boluses, with the hypotension developed pulmonary edema and was diuresed. She improved relatively quickly in the next couple days, call echocardiogram showed improvement in EF from 25-30% with anterolateral lateral and inferolateral apical hypokinesis (suggestive of Takotsubo cardiomyopathy) to 50-55% 6 days later. Cardiac catheterization failed to reveal any evidence to suggest a culprit lesion for her as elevations. By time her catheterization her EF was improved to 40%. The diagnosis was then made of stress-induced cardiomyopathy and not true acute cardiac syndrome related ST Elevation MI. She was discharged home on December 28. She now presents for her first posthospital  followup.  Interval History: Since her discharge, she has been doing fairly well. She still has limited episodes of chest discomfort across her chest and her notably mild in comparison to her postop episode. She does to specific episodes that occurred in the morning. She has noted that sometimes her blood pressure will come and go while on it as her heart rate MS when she starts to feel his symptoms. She's had a little bit of orthopnea but, but thinks this may safely be because of her back brace. She is not had any PND, or edema. No significant palpitations or rapid heartbeat. No TIA or RCA symptoms. No syncope or near syncope. No real  dizziness. No melena, hematochezia or hematuria. She does feel somewhat tired and fatigued. She gets occasional lightheadedness when going from sitting to standing.  Past Medical History  Diagnosis Date  . 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 PONV (postoperative nausea and vomiting)   . Hypertension   . Chronic kidney disease     abnormal labs  . Fibromyalgia   . Family history of anesthesia complication     "son goes crazy; hits people" (02/18/2013)  . 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 Pneumonia 1950; ~ 1960; ~ 1967  . Chronic bronchitis     "usually get it q other year" (02/18/2013)  . Type II diabetes mellitus   . Arthritis     "all my joints" (02/18/2013)  . ST elevation myocardial infarction (STEMI)  of lateral wall 02/19/2013    Post-OPerative Stress induced -- no evidence of CAD.  STE improved with NTG   . HTN (hypertension) 02/19/2013  . Dyslipidemia, goal LDL below 70 02/19/2013  . Diabetes 02/19/2013  . Cardiomyopathy, postop stress  02/23/2013    [place]o - with transient STEMI; followed by acute HF/cardiogenic shock -- now resolved & EF back to normal    Prior Cardiac Evaluation and Past Surgical  History: Past Surgical History  Procedure Laterality Date  . Parathyroidectomy  08/07/09    [facility]  . Colonoscopy w/ biopsies    . Laryngoscopy      laryngoscopy micro suspension with left Cymetra injection for dysphonia 09/29/09 [facility])  . Posterior lumbar fusion  02/18/2013  . Tonsillectomy  1950's  . Appendectomy  02/1966  . Cholecystectomy  ~2001  . Tubal ligation  1979  . Plantar's wart excision Bilateral 1960's; 1980  . Cardiac catheterization  1990 X2  . Abdominal hysterectomy  [id number]  . Maximum access (mas)posterior lumbar interbody fusion (plif) 1 level N/A 02/18/2013    Procedure: LUMBAR FIVE TO SACRAL ONE MAXIMUM ACCESS (MAS) POSTERIOR LUMBAR INTERBODY FUSION (PLIF) 1 LEVEL;  Surgeon: Eustace Moore, MD;  Location: [place] [facility];  Service: Neurosurgery;  Laterality: N/A;  . Cardiac catheterization  02/22/2013    Nonischemic; no notable CAD. EF 40%.  . Transthoracic echocardiogram  02/18/2013    EF 35-30% with anterolateral, lateral and inferolateral/apical hypokinesis (Takotsubo)   . Transthoracic echocardiogram  02/25/2013    EF 55%. No clear regional wall motion abnormalities. Grade 3 diastolic dysfunction. Mild MR    Allergies  Allergen Reactions  . Penicillins Anaphylaxis  . Benzoin     Burning - "looked like a 2nd degree burn"    Current Outpatient Prescriptions  Medication Sig Dispense Refill  . atorvastatin (LIPITOR) 80 MG tablet Take 1 tablet (80 mg total) by mouth daily at 6 PM.  30 tablet  1  . carvedilol (COREG) 6.25 MG tablet Take 1 tablet (6.25 mg total) by mouth 2 (two) times daily with a meal.  180 tablet  3  . DULoxetine (CYMBALTA) 60 MG capsule Take 60 mg by mouth daily.      Marland Kitchen glipiZIDE (GLUCOTROL XL) 10 MG 24 hr tablet Take 10 mg by mouth 2 (two) times daily.      Marland Kitchen HYDROcodone-acetaminophen (NORCO) 7.5-325 MG per tablet Take 1 tablet by mouth every 6 (six) hours as needed (for pain).  41 tablet  0  . hydrocortisone cream 1 % Apply topically 2  (two) times daily.  30 g  0  . losartan (COZAAR) 50 MG tablet Take 1 tablet (50 mg total) by mouth daily.  30 tablet  1  . metFORMIN (GLUCOPHAGE) 1000 MG tablet Take 1,000 mg by mouth 2 (two) times daily with a meal.      . sitaGLIPtin (JANUVIA) 100 MG tablet Take 100 mg by mouth daily.      Marland Kitchen triamterene-hydrochlorothiazide (MAXZIDE-25) 37.5-25 MG per tablet Take 1 tablet by mouth daily.      . Vitamin D, Ergocalciferol, (DRISDOL) 50000 UNITS CAPS capsule Take 50,000 Units by mouth every 7 (seven) days. Wednesdays      . nitroGLYCERIN (NITROSTAT) 0.4 MG SL tablet Place 1 tablet (0.4 mg total) under the tongue every 5 (five) minutes as needed for chest pain.  25 tablet  6   No current facility-administered medications for this visit.    History   Social History Narrative  She is a married mother of 2 with 2 grandchildren. She denied for 43 years. She is retired Network engineer having completed high school. She does not drink alcohol never smoked she tries it exercise walk on treadmill about 3 days a week. Has been somewhat limited because of her back pain.   Family History:  family history includes Cancer (age of onset: 76) in her mother; Cancer (age of onset: 27) in her father; Diabetes in her paternal grandmother; Heart attack in her maternal grandfather and paternal grandfather; Heart attack (age of onset: 74) in her father; Heart attack (age of onset: 68) in her mother; Heart disease in her brother.  ROS: A comprehensive Review of Systems - Negative except Discomfort in her back, otherwise negative if not noted about the  PHYSICAL EXAM BP 130/74  Pulse 70  Ht 5' 3"  (1.6 m)  Wt 146 lb 4.8 oz (66.361 kg)  BMI 25.92 kg/m2 General appearance: alert, cooperative, appears stated age, no distress and Well-nourished and well-groomed. She has a brace in place. Neck: no adenopathy, no carotid bruit, no JVD, supple, symmetrical, trachea midline and HEENT: [place]/at T., EOMI, MMM, anicteric sclera. Lungs:  clear to auscultation bilaterally, normal percussion bilaterally and Nonlabored, good air movement Heart: regular rate and rhythm, S1, S2 normal and Soft 1/6 HSM at apex. Otherwise no M./R./G. Abdomen: soft, non-tender; bowel sounds normal; no masses,  no organomegaly Extremities: extremities normal, atraumatic, no cyanosis or edema Pulses: 2+ and symmetric Neurologic: Grossly normal  UTM:LYYTKPTWS today: Yes Rate: 70 , Rhythm: NSR;  lateral T wave inversions, cannot exclude ischemia - similar to December 27 ECG. Recent Labs: Reviewed from hospitalization on Epic.  ASSESSMENT / PLAN: ST elevation myocardial infarction (STEMI) of lateral wall This was a transient ST elevation on ECG. She had positive troponins, but no evidence of ischemic coronary disease. 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 She still having some mild discomfort in her chest, I wonder she has coronary artery spasm that could have been triggered by postoperative stress.  Plan: Add when necessary nitroglycerin. Continue statin, beta blocker and ARB.  Takotsubo cardiomyopathy -- postop stress-induced: Resolved No further heart failure symptoms. Her EF is essentially back to normal now. No CAD on cardiac catheterization. No need for diuretic.  Plan: She was started on metoprolol, and was discharged on carvedilol. I prefer carvedilol, we'll therefore discontinue metoprolol. Continue ARB and monitor blood pressures. She was discharged on digoxin, however as her EF is back to normal, and that's probably not necessary. Will DC the digoxin. He was not to be covered by her insurance company anyway.  Chest pain with moderate risk for cardiac etiology With her still having symptoms that are similar to her postop episode but is not severe, I am concerned that she has a component of coronary spasm. They don't seem to be having that frequently centimeters use a little much glycerin when necessary.  However at that increase his in frequency, I'll probably put her on long-acting nitrates. Her episode was definitely alleviated with nitroglycerin in the hospital.  Dyslipidemia, goal LDL below 70 Simply for the endothelial stabilization complement, I think she needs to be treated as if she has CAD with a goal less than 70 HDL. She was discharged on 80 of Lipitor, I think we probably decreased it to 40 of Lipitor to avoid myalgias and arthralgias. She is noted that in the past.  HTN (hypertension) Blood pressure is pretty well controlled. She is on beta blocker  ARB and diuretic combination medication. Need to avoid dehydration.    Orders Placed This Encounter  Procedures  . EKG 12-Lead   Meds ordered this encounter  Medications  . nitroGLYCERIN (NITROSTAT) 0.4 MG SL tablet    Sig: Place 1 tablet (0.4 mg total) under the tongue every 5 (five) minutes as needed for chest pain.    Dispense:  25 tablet    Refill:  6  . carvedilol (COREG) 6.25 MG tablet    Sig: Take 1 tablet (6.25 mg total) by mouth 2 (two) times daily with a meal.    Dispense:  180 tablet    Refill:  3    Followup: 3 months  Chanese Hartsough W. Ellyn Hack, M.D., M.S. [facility] [address] [place], [place]  [address]  [phone] Pager # [phone]

## 2013-03-27 NOTE — Assessment & Plan Note (Signed)
Simply for the endothelial stabilization complement, I think she needs to be treated as if she has CAD with a goal less than 70 HDL. She was discharged on 80 of Lipitor, I think we probably decreased it to 40 of Lipitor to avoid myalgias and arthralgias. She is noted that in the past.

## 2013-03-27 NOTE — Assessment & Plan Note (Signed)
With her still having symptoms that are similar to her postop episode but is not severe, I am concerned that she has a component of coronary spasm. They don't seem to be having that frequently centimeters use a little much glycerin when necessary. However at that increase his in frequency, I'll probably put her on long-acting nitrates. Her episode was definitely alleviated with nitroglycerin in the hospital.

## 2013-03-27 NOTE — Assessment & Plan Note (Signed)
Blood pressure is pretty well controlled. She is on beta blocker ARB and diuretic combination medication. Need to avoid dehydration.

## 2013-03-27 NOTE — Assessment & Plan Note (Signed)
No further heart failure symptoms. Her EF is essentially back to normal now. No CAD on cardiac catheterization. No need for diuretic.  Plan: She was started on metoprolol, and was discharged on carvedilol. I prefer carvedilol, we'll therefore discontinue metoprolol. Continue ARB and monitor blood pressures. She was discharged on digoxin, however as her EF is back to normal, and that's probably not necessary. Will DC the digoxin. He was not to be covered by her insurance company anyway.

## 2013-03-28 ENCOUNTER — Encounter (HOSPITAL_COMMUNITY): Payer: Self-pay | Admitting: Emergency Medicine

## 2013-03-28 ENCOUNTER — Emergency Department (HOSPITAL_COMMUNITY): Payer: Medicare Other

## 2013-03-28 ENCOUNTER — Emergency Department (HOSPITAL_COMMUNITY)
Admission: EM | Admit: 2013-03-28 | Discharge: 2013-03-28 | Disposition: A | Discharge status: Home / Self Care | Payer: Medicare Other | Attending: Emergency Medicine | Admitting: Emergency Medicine

## 2013-03-28 DIAGNOSIS — E785 Hyperlipidemia, unspecified: Secondary | ICD-10-CM | POA: Insufficient documentation

## 2013-03-28 DIAGNOSIS — I252 Old myocardial infarction: Secondary | ICD-10-CM | POA: Insufficient documentation

## 2013-03-28 DIAGNOSIS — Z8701 Personal history of pneumonia (recurrent): Secondary | ICD-10-CM | POA: Insufficient documentation

## 2013-03-28 DIAGNOSIS — N189 Chronic kidney disease, unspecified: Secondary | ICD-10-CM | POA: Insufficient documentation

## 2013-03-28 DIAGNOSIS — E119 Type 2 diabetes mellitus without complications: Secondary | ICD-10-CM | POA: Insufficient documentation

## 2013-03-28 DIAGNOSIS — Z79899 Other long term (current) drug therapy: Secondary | ICD-10-CM | POA: Insufficient documentation

## 2013-03-28 DIAGNOSIS — R42 Dizziness and giddiness: Secondary | ICD-10-CM | POA: Insufficient documentation

## 2013-03-28 DIAGNOSIS — R0602 Shortness of breath: Secondary | ICD-10-CM | POA: Insufficient documentation

## 2013-03-28 DIAGNOSIS — R079 Chest pain, unspecified: Secondary | ICD-10-CM

## 2013-03-28 DIAGNOSIS — J42 Unspecified chronic bronchitis: Secondary | ICD-10-CM | POA: Insufficient documentation

## 2013-03-28 DIAGNOSIS — I129 Hypertensive chronic kidney disease with stage 1 through stage 4 chronic kidney disease, or unspecified chronic kidney disease: Secondary | ICD-10-CM | POA: Insufficient documentation

## 2013-03-28 DIAGNOSIS — Z86718 Personal history of other venous thrombosis and embolism: Secondary | ICD-10-CM | POA: Insufficient documentation

## 2013-03-28 DIAGNOSIS — Z8739 Personal history of other diseases of the musculoskeletal system and connective tissue: Secondary | ICD-10-CM | POA: Insufficient documentation

## 2013-03-28 DIAGNOSIS — R0789 Other chest pain: Secondary | ICD-10-CM | POA: Insufficient documentation

## 2013-03-28 DIAGNOSIS — Z880 Allergy status to penicillin: Secondary | ICD-10-CM | POA: Insufficient documentation

## 2013-03-28 LAB — BASIC METABOLIC PANEL
BUN: 10 mg/dL (ref 6–23)
CO2: 23 mEq/L (ref 19–32)
Calcium: 7.3 mg/dL — ABNORMAL LOW (ref 8.4–10.5)
Chloride: 98 mEq/L (ref 96–112)
Creatinine, Ser: 0.84 mg/dL (ref 0.50–1.10)
GFR calc non Af Amer: 71 mL/min — ABNORMAL LOW (ref >90)
GFR, EST AFRICAN AMERICAN: 83 mL/min — AB (ref >90)
Glucose, Bld: 99 mg/dL (ref 70–99)
POTASSIUM: 3.8 meq/L (ref 3.7–5.3)
SODIUM: 140 meq/L (ref 137–147)

## 2013-03-28 LAB — CBC WITH DIFFERENTIAL/PLATELET
BASOS ABS: 0.1 10*3/uL (ref 0.0–0.1)
BASOS PCT: 1 % (ref 0–1)
EOS PCT: 4 % (ref 0–5)
Eosinophils Absolute: 0.4 10*3/uL (ref 0.0–0.7)
HCT: 31.9 % — ABNORMAL LOW (ref 36.0–46.0)
Hemoglobin: 11.3 g/dL — ABNORMAL LOW (ref 12.0–15.0)
Lymphocytes Relative: 28 % (ref 12–46)
Lymphs Abs: 2.9 10*3/uL (ref 0.7–4.0)
MCH: 32.9 pg (ref 26.0–34.0)
MCHC: 35.4 g/dL (ref 30.0–36.0)
MCV: 93 fL (ref 78.0–100.0)
Monocytes Absolute: 0.7 10*3/uL (ref 0.1–1.0)
Monocytes Relative: 7 % (ref 3–12)
NEUTROS PCT: 61 % (ref 43–77)
Neutro Abs: 6.4 10*3/uL (ref 1.7–7.7)
PLATELETS: 267 10*3/uL (ref 150–400)
RBC: 3.43 MIL/uL — AB (ref 3.87–5.11)
RDW: 13.8 % (ref 11.5–15.5)
WBC: 10.5 10*3/uL (ref 4.0–10.5)

## 2013-03-28 LAB — POCT I-STAT TROPONIN I
TROPONIN I, POC: 0.01 ng/mL (ref 0.00–0.08)
Troponin i, poc: 0 ng/mL (ref 0.00–0.08)
Troponin i, poc: 0.01 ng/mL (ref 0.00–0.08)

## 2013-03-28 NOTE — ED Notes (Signed)
Complaints of feeling shaky  ED provider notified and peanut butter and crackers given with juice

## 2013-03-28 NOTE — ED Provider Notes (Signed)
CSN: [id number]     Arrival date & time 03/28/13  [id number] History   First MD Initiated Contact with Patient 03/28/13 1000     Chief Complaint  Patient presents with  . Chest Pain   (Consider location/radiation/quality/duration/timing/severity/associated sxs/prior Treatment) HPI Sarah Flores is a 67 y.o. female with history of recent stress MI, day after lumbar fusion a month ago, with clean coronary arteries, presents today complaining of episode of chest pain. Pt states she was walking to church when suddenly developed chest pressure, dizziness, shortness of breath. States pressure began in left chest and radiated across. States felt similar to her stress MI, but not as severe. States also felt some discomfort in the left lower face. States symptoms lasted approximately 20-15min, until she took a nitroglycerine. States currently symptoms resolved. States also received aspirin 325mg  by EMS. No LE swelling. No LE pain. No current shortness of breath. No other cardiac or lung problems.   Past Medical History  Diagnosis Date  . 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 PONV (postoperative nausea and vomiting)   . Hypertension   . Chronic kidney disease     abnormal labs  . Fibromyalgia   . Family history of anesthesia complication     "son goes crazy; hits people" (02/18/2013)  . 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 Pneumonia 1950; ~ 1960; ~ 1967  . Chronic bronchitis     "usually get it q other year" (02/18/2013)  . Type II diabetes mellitus   . Arthritis     "all my joints" (02/18/2013)  . 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  STE improved with NTG   . HTN (hypertension) 02/19/2013  . Dyslipidemia, goal LDL below 70 02/19/2013  . Diabetes 02/19/2013  .  Cardiomyopathy, postop stress  02/23/2013    [facility] - with transient STEMI; followed by acute HF/cardiogenic shock -- now resolved & EF back to normal   Past Surgical History  Procedure Laterality Date  . Parathyroidectomy  08/07/09    [facility]  . Colonoscopy w/ biopsies    . Laryngoscopy      laryngoscopy micro suspension with left Cymetra injection for dysphonia 09/29/09 [facility])  . Posterior lumbar fusion  02/18/2013  . Tonsillectomy  1950's  . Appendectomy  02/1966  . Cholecystectomy  ~2001  . Tubal ligation  1979  . Plantar's wart excision Bilateral 1960's; 1980  . Cardiac catheterization  1990 X2  . Abdominal hysterectomy  [id number]  . Maximum access (mas)posterior lumbar interbody fusion (plif) 1 level N/A 02/18/2013    Procedure: LUMBAR FIVE TO SACRAL ONE MAXIMUM ACCESS (MAS) POSTERIOR LUMBAR INTERBODY FUSION (PLIF) 1 LEVEL;  Surgeon: Eustace Moore, MD;  Location: [place] [facility];  Service: Neurosurgery;  Laterality: N/A;  . Cardiac catheterization  02/22/2013    Nonischemic; no notable CAD. EF 40%.  . Transthoracic echocardiogram  02/18/2013    EF 35-30% with anterolateral, lateral and inferolateral/apical hypokinesis (Takotsubo)   . Transthoracic echocardiogram  02/25/2013    EF 55%. No clear regional wall motion abnormalities. Grade 3 diastolic dysfunction. Mild MR   Family History  Problem Relation Age of Onset  . Cancer Mother 76  . Heart attack Mother 65  . Heart attack Father 61  . Cancer  Father 63    Leukemia  . Heart disease Brother   . Heart attack Maternal Grandfather   . Diabetes Paternal Grandmother   . Heart attack Paternal Grandfather    History  Substance Use Topics  . Smoking status: Never Smoker   . Smokeless tobacco: Never Used  . Alcohol Use: No   OB History   Grav Para Term Preterm Abortions TAB SAB Ect Mult Living                 Review of Systems  Constitutional: Negative for fever and chills.  Respiratory: Positive for chest  tightness and shortness of breath. Negative for cough.   Cardiovascular: Positive for chest pain. Negative for palpitations and leg swelling.  Gastrointestinal: Negative for nausea, vomiting, abdominal pain and diarrhea.  Genitourinary: Negative for dysuria, flank pain, vaginal bleeding, vaginal discharge, vaginal pain and pelvic pain.  Musculoskeletal: Negative for arthralgias, myalgias, neck pain and neck stiffness.  Skin: Negative for rash.  Neurological: Positive for dizziness and light-headedness. Negative for weakness and headaches.  All other systems reviewed and are negative.    Allergies  Penicillins and Benzoin  Home Medications   Current Outpatient Rx  Name  Route  Sig  Dispense  Refill  . atorvastatin (LIPITOR) 40 MG tablet   Oral   Take 40 mg by mouth daily.         . carvedilol (COREG) 6.25 MG tablet   Oral   Take 1 tablet (6.25 mg total) by mouth 2 (two) times daily with a meal.   180 tablet   3   . DULoxetine (CYMBALTA) 60 MG capsule   Oral   Take 60 mg by mouth daily.         Marland Kitchen glipiZIDE (GLUCOTROL XL) 10 MG 24 hr tablet   Oral   Take 10 mg by mouth 2 (two) times daily.         Marland Kitchen HYDROcodone-acetaminophen (NORCO) 7.5-325 MG per tablet   Oral   Take 1 tablet by mouth every 6 (six) hours as needed (for pain).   41 tablet   0   . losartan (COZAAR) 50 MG tablet   Oral   Take 1 tablet (50 mg total) by mouth daily.   30 tablet   1   . metFORMIN (GLUCOPHAGE) 1000 MG tablet   Oral   Take 1,000 mg by mouth 2 (two) times daily with a meal.         . nitroGLYCERIN (NITROSTAT) 0.4 MG SL tablet   Sublingual   Place 1 tablet (0.4 mg total) under the tongue every 5 (five) minutes as needed for chest pain.   25 tablet   6   . sitaGLIPtin (JANUVIA) 100 MG tablet   Oral   Take 100 mg by mouth daily.         Marland Kitchen triamterene-hydrochlorothiazide (MAXZIDE-25) 37.5-25 MG per tablet   Oral   Take 1 tablet by mouth daily.         . Vitamin D,  Ergocalciferol, (DRISDOL) 50000 UNITS CAPS capsule   Oral   Take 50,000 Units by mouth every 7 (seven) days. Wednesdays          BP 139/65  Pulse 79  Temp(Src) 98 F (36.7 C) (Oral)  Resp 20  SpO2 99% Physical Exam  Nursing note and vitals reviewed. Constitutional: She appears well-developed and well-nourished. No distress.  HENT:  Head: Normocephalic.  Eyes: Conjunctivae are normal.  Neck: Neck supple.  Cardiovascular: Normal  rate, regular rhythm and normal heart sounds.   Pulmonary/Chest: Effort normal and breath sounds normal. No respiratory distress. She has no wheezes. She has no rales.  Abdominal: Soft. Bowel sounds are normal. She exhibits no distension. There is no tenderness. There is no rebound.  Musculoskeletal: She exhibits no edema.  No calf tenderness bilaterally.  Negative Homan's sign bilaterally  Neurological: She is alert.  Skin: Skin is warm and dry.  Psychiatric: She has a normal mood and affect. Her behavior is normal.    ED Course  Procedures (including critical care time) Labs Review Labs Reviewed  CBC WITH DIFFERENTIAL - Abnormal; Notable for the following:    RBC 3.43 (*)    Hemoglobin 11.3 (*)    HCT 31.9 (*)    All other components within normal limits  BASIC METABOLIC PANEL   Imaging Review Dg Chest Portable 1 View  03/28/2013   CLINICAL DATA:  Chest pain.  EXAM: PORTABLE CHEST - 1 VIEW  COMPARISON:  02/23/2013.  FINDINGS: Interim removal of right IJ sheath. Interim near complete clearing right lower lobe subsegmental atelectasis and or infiltrate. No pleural effusion or pneumothorax. Heart size and pulmonary vascularity stable. No acute osseus abnormality.  IMPRESSION: 1. Interim removal of right IJ sheath. 2. Interim near complete clearing right lower lobe subsegmental atelectasis and or infiltrate.   Electronically Signed   By: Marcello Moores  Register   On: 03/28/2013 10:54    EKG Interpretation    Date/Time:  Sunday March 28 2013 10:05:36  EST Ventricular Rate:  67 PR Interval:  176 QRS Duration: 88 QT Interval:  411 QTC Calculation: 434 R Axis:   94 Text Interpretation:  Sinus rhythm Right axis deviation Nonspecific T abnormalities, lateral leads Compared to previous tracing T wave abnormality Lateral leads less evident Confirmed by [facility]  MD, JOHN ([id number]) on 03/28/2013 10:14:35 AM            MDM   1. Chest pain     Pt with chest pain, sob, sudden onset around 9am today. Pt has had resent ST elevation stress MI. She admits these symptoms are similar to those but not as severe. Will get 3 delta trops, ECG, labs, CXR.    3:53 PM Pt has had 3 sets of troponin, last one 6 hrs from onset. All negative. PT is symptom free. She is feeling normal and pain free. Discussed with Dr. Stevie Kern, agrees, pt is OK to go home. She is instructed to call her cardiologist tomorrow. Take nitro with any onset of symptoms. Return if worsening.   Filed Vitals:   03/28/13 1315 03/28/13 1330 03/28/13 1345 03/28/13 1400  BP: 133/71   118/66  Pulse: 69 74 74 66  Temp:      TempSrc:      Resp: 13 18 18 19   SpO2: 98% 96% 97% 97%       Renold Genta, PA-C 03/28/13 1554

## 2013-03-28 NOTE — Discharge Instructions (Signed)
Continue nitroglycerin as needed if any onset of chest pain or shortness of breath. If symptoms do not go away, please call 911 or return to the ER. Follow up with your cardiologist.    Chest Pain (Nonspecific) It is often hard to give a specific diagnosis for the cause of chest pain. There is always a chance that your pain could be related to something serious, such as a heart attack or a blood clot in the lungs. You need to follow up with your caregiver for further evaluation. CAUSES   Heartburn.  Pneumonia or bronchitis.  Anxiety or stress.  Inflammation around your heart (pericarditis) or lung (pleuritis or pleurisy).  A blood clot in the lung.  A collapsed lung (pneumothorax). It can develop suddenly on its own (spontaneous pneumothorax) or from injury (trauma) to the chest.  Shingles infection (herpes zoster virus). The chest wall is composed of bones, muscles, and cartilage. Any of these can be the source of the pain.  The bones can be bruised by injury.  The muscles or cartilage can be strained by coughing or overwork.  The cartilage can be affected by inflammation and become sore (costochondritis). DIAGNOSIS  Lab tests or other studies, such as X-rays, electrocardiography, stress testing, or cardiac imaging, may be needed to find the cause of your pain.  TREATMENT   Treatment depends on what may be causing your chest pain. Treatment may include:  Acid blockers for heartburn.  Anti-inflammatory medicine.  Pain medicine for inflammatory conditions.  Antibiotics if an infection is present.  You may be advised to change lifestyle habits. This includes stopping smoking and avoiding alcohol, caffeine, and chocolate.  You may be advised to keep your head raised (elevated) when sleeping. This reduces the chance of acid going backward from your stomach into your esophagus.  Most of the time, nonspecific chest pain will improve within 2 to 3 days with rest and mild pain  medicine. HOME CARE INSTRUCTIONS   If antibiotics were prescribed, take your antibiotics as directed. Finish them even if you start to feel better.  For the next few days, avoid physical activities that bring on chest pain. Continue physical activities as directed.  Do not smoke.  Avoid drinking alcohol.  Only take over-the-counter or prescription medicine for pain, discomfort, or fever as directed by your caregiver.  Follow your caregiver's suggestions for further testing if your chest pain does not go away.  Keep any follow-up appointments you made. If you do not go to an appointment, you could develop lasting (chronic) problems with pain. If there is any problem keeping an appointment, you must call to reschedule. SEEK MEDICAL CARE IF:   You think you are having problems from the medicine you are taking. Read your medicine instructions carefully.  Your chest pain does not go away, even after treatment.  You develop a rash with blisters on your chest. SEEK IMMEDIATE MEDICAL CARE IF:   You have increased chest pain or pain that spreads to your arm, neck, jaw, back, or abdomen.  You develop shortness of breath, an increasing cough, or you are coughing up blood.  You have severe back or abdominal pain, feel nauseous, or vomit.  You develop severe weakness, fainting, or chills.  You have a fever. THIS IS AN EMERGENCY. Do not wait to see if the pain will go away. Get medical help at once. Call your local emergency services (911 in [place] Do not drive yourself to the hospital. MAKE SURE YOU:   Understand  these instructions.  Will watch your condition.  Will get help right away if you are not doing well or get worse. Document Released: 11/28/2004 Document Revised: 05/13/2011 Document Reviewed: 09/24/2007 [facility] Patient Information 2014 [place].

## 2013-03-28 NOTE — ED Notes (Signed)
PA at bedside.

## 2013-03-28 NOTE — ED Provider Notes (Signed)
Medical screening examination/treatment/procedure(s) were performed by non-physician practitioner and as supervising physician I was immediately available for consultation/collaboration.  EKG Interpretation    Date/Time:  Sunday March 28 2013 10:05:36 EST Ventricular Rate:  67 PR Interval:  176 QRS Duration: 88 QT Interval:  411 QTC Calculation: 434 R Axis:   94 Text Interpretation:  Sinus rhythm Right axis deviation Nonspecific T abnormalities, lateral leads Compared to previous tracing T wave abnormality Lateral leads less evident Confirmed by Stevie Kern  MD, Jenny Reichmann [phone]) on 03/28/2013 10:14:35 AM             Babette Relic, MD 03/28/13 [id number]

## 2013-03-28 NOTE — ED Notes (Signed)
Patient went to church had a sudden onset of CP around 8:50am. She  felt lightheaded and dizzy as if she was going to pass out she was assisted to the floor and first responders arrived and placed her on 15 liters of O2. Patient took one Nitro, She was given 324 of ASA in route,At this time CP is totally resolved. History of back surgery in [place]. 18th and while patient was in the hospital at [facility] on Dec.19th she advised that she had a mild MI.

## 2013-04-14 ENCOUNTER — Telehealth: Payer: Self-pay | Admitting: Cardiology

## 2013-04-14 NOTE — Telephone Encounter (Signed)
UNABLE TO REACH PATIENT

## 2013-04-14 NOTE — Telephone Encounter (Signed)
Dr.Harding took her off of Metoprolol and place her on carvedilol , heart has never dropped under 97, earlier today it was 105 and she just took it again along with her bp and its a 104 and wants to know what should she do .Marland Kitchen   Please call

## 2013-04-15 MED ORDER — CARVEDILOL 6.25 MG PO TABS: 9.3750 mg | ORAL_TABLET | Freq: Two times a day (BID) | ORAL | Status: DC

## 2013-04-15 NOTE — Telephone Encounter (Signed)
Returned call and pt verified x 2.  Pt informed per Dr. Ellyn Hack.  Pt verbalized understanding and agreed w/ plan.  Med list updated.  Pt agreed to call back if better controlled so new Rx can be sent to pharmacy.  BP was 131/95 HR 107 this AM and 134/88 HR 95 a few minutes ago.

## 2013-04-15 NOTE — Telephone Encounter (Signed)
If her BP was > 438 mm Hg systolic today - can increase Coreg to 9.375 bid.    [name], MD

## 2013-04-15 NOTE — Telephone Encounter (Signed)
Returned call and pt verified x 2 w/ pt's husband, Ronalee Belts.  Stated pt just left.  Stated pt's HR has not been below 95 and has been running about 104, 105.  Stated pt wants to know if she can take something else for control.  Husband informed HR 60-100 is normal.  Husband stated their pharmacist didn't seem to think that pt's HR was normal.  Husband informed again that 60-100 is normal and asked if pt is having any symptoms.  He stated pt "feels like her heart is beating out of her chest."  Husband advised to have pt call back when she returns if she has anything to add and that Dr. Ellyn Hack will be notified for further instructions.  Verbalized understanding and agreed w/ plan.  Message forwarded to Dr. Ellyn Hack.

## 2013-04-23 ENCOUNTER — Telehealth: Payer: Self-pay | Admitting: *Deleted

## 2013-04-23 NOTE — Telephone Encounter (Signed)
Faxed order sheet -- okay to have lipids drawn at [facility] rehab per Dr Ellyn Hack.

## 2013-05-04 ENCOUNTER — Encounter: Payer: Self-pay | Admitting: Cardiology

## 2013-05-12 ENCOUNTER — Telehealth: Payer: Self-pay | Admitting: *Deleted

## 2013-05-12 NOTE — Telephone Encounter (Signed)
Left message on answer machine  To call back

## 2013-05-12 NOTE — Telephone Encounter (Signed)
Message copied by Raiford Simmonds on Wed May 12, 2013  5:04 PM ------      Message from: Ellyn Hack, DAVID W      Created: Wed May 12, 2013  2:04 AM       LDL is less than 100 which is good. Triglycerides however are too high.      Would recommend reducing starchy vegetable intake.  For now to continue current medications.      We can recheck in 6 months or per PCP.            [name], MD ------

## 2013-05-13 ENCOUNTER — Other Ambulatory Visit: Payer: Self-pay | Admitting: *Deleted

## 2013-05-13 DIAGNOSIS — E782 Mixed hyperlipidemia: Secondary | ICD-10-CM

## 2013-05-13 DIAGNOSIS — Z79899 Other long term (current) drug therapy: Secondary | ICD-10-CM

## 2013-05-13 NOTE — Telephone Encounter (Signed)
Returned call and pt verified x 2.  Pt informed message received and Ivin Booty, RN was calling her w/ lab results.  Results given per MD.  Pt verbalized understanding and agreed w/ plan.

## 2013-05-13 NOTE — Telephone Encounter (Signed)
Returning your call .Marland Kitchen Please call   thank

## 2013-05-13 NOTE — Telephone Encounter (Signed)
Labs schedule for sept 2015

## 2013-07-08 ENCOUNTER — Ambulatory Visit (INDEPENDENT_AMBULATORY_CARE_PROVIDER_SITE_OTHER): Payer: Medicare Other | Admitting: Cardiology

## 2013-07-08 ENCOUNTER — Encounter: Payer: Self-pay | Admitting: Cardiology

## 2013-07-08 VITALS — BP 114/76 | HR 87 | Ht 63.0 in | Wt 150.7 lb

## 2013-07-08 DIAGNOSIS — I10 Essential (primary) hypertension: Secondary | ICD-10-CM

## 2013-07-08 DIAGNOSIS — R002 Palpitations: Secondary | ICD-10-CM

## 2013-07-08 DIAGNOSIS — E785 Hyperlipidemia, unspecified: Secondary | ICD-10-CM

## 2013-07-08 DIAGNOSIS — I519 Heart disease, unspecified: Secondary | ICD-10-CM

## 2013-07-08 DIAGNOSIS — I5189 Other ill-defined heart diseases: Secondary | ICD-10-CM

## 2013-07-08 DIAGNOSIS — R079 Chest pain, unspecified: Secondary | ICD-10-CM

## 2013-07-08 DIAGNOSIS — I5181 Takotsubo syndrome: Secondary | ICD-10-CM

## 2013-07-08 DIAGNOSIS — I5032 Chronic diastolic (congestive) heart failure: Secondary | ICD-10-CM

## 2013-07-08 NOTE — Assessment & Plan Note (Signed)
Reduced down to 10 mg atorvastatin.  Hasn't been followed by her PCP. She should be due for labs soon.

## 2013-07-08 NOTE — Assessment & Plan Note (Signed)
These occurrences are having less and less frequently. I don't see that she is currently on Imdur. We'll simply use when necessary nitroglycerin it is not that frequent.

## 2013-07-08 NOTE — Assessment & Plan Note (Signed)
Much better controlled on increased dose of ARB and beta blocker.

## 2013-07-08 NOTE — Assessment & Plan Note (Signed)
No residual systolic dysfunction, however there was evidence of diastolic dysfunction. I like to check an echocardiogram after I see her back sometime in November to December of this year just to see if there is still evidence of significant diastolic dysfunction. That may be why she is a little short of breath when she feels her heart racing. She is on good dose of beta blocker and ARB.

## 2013-07-08 NOTE — Progress Notes (Signed)
PCP: Gilford Rile, MD  Clinic Note: Chief Complaint  Patient presents with  . 3 month visit    WENT TO ER IN JAN 2015,chest pain with activity and rest , has use NTG , FLUTTERS ALL THE TIME ,COMPLETE REHAB TOMORROW, SOB , NO EDEMA   HPI: Sarah Flores is a 68 y.o. 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 She actually had symptoms concerning for possible STEMI with hypotension. After stabilization, she was taken to the Cath Lab and found to have relatively normal coronary arteries. Her initial EF was in the 20-30% range with anterolateral lateral and inferolateral/apical hypokinesis. This was all but resolved by 6 days later. I saw her back on January 22, she is doing relatively well still having a little chest discomfort. Over that weekend she had significant spell where she got extremely short of breath clammy chest discomfort. EMS arrived and brought her to the emergency room. She states that the EMS personnel suggested that she may have been going in and out of A. Fib.  Unfortunately, there was no EKG to document that. She has been working with cardiac rehabilitation, and saying that she spends more time in "timeout"for her rapid heart rate than she does during exercise. She feels her heart rate going up but does not notice any significant symptoms associated with it.  Interval History: In the interim since that visit many [place] today, she has had her old dose and decreased to 12-1/2 twice a day as well as her losartan dose increased to 100. Since that happened, she is feeling overall better. She still has episodes where she feels her heart races her cath flushes. They'll almost happening every day until she injured carvedilol. Now it's couple times a week. She says that she occasionally has some orthostatic lightheadedness. She also gets dyspneic if she walks any  significant distance or for more like 10 minutes. She denies any exertional chest discomfort now. No PND, orthopnea or edema. No syncope/near syncope, TIA/amaurosis fugax symptoms. No melena, hematochezia, hematuria, epistaxis. No claudication. She has had a couple more so chest discomfort, usually at rest alleviated with nitroglycerin. She is in addition to 2 times since her ER visit.  Past Medical History  Diagnosis Date  . 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 PONV (postoperative nausea and vomiting)   . Hypertension   . Chronic kidney disease     abnormal labs  . Fibromyalgia   . Family history of anesthesia complication     "son goes crazy; hits people" (02/18/2013)  . 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 Pneumonia 1950; ~ 1960; ~ 1967  . Chronic bronchitis     "usually get it q other year" (02/18/2013)  . Type II diabetes mellitus   . Arthritis     "all my joints" (02/18/2013)  . 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  STE improved with NTG   . HTN (hypertension) 02/19/2013  . Dyslipidemia, goal LDL below 70 02/19/2013  . Diabetes 02/19/2013  . Cardiomyopathy, postop stress  02/23/2013    TakoTsubo - with transient STEMI; followed by acute HF/cardiogenic shock -- now resolved & EF back to normal  Past Cardiovascular Evaluation/Procedure History  Procedure Laterality Date  . Cardiac catheterization  1990 X2  . Cardiac catheterization  02/22/2013    Nonischemic; no notable CAD. EF 40%.  . Transthoracic echocardiogram  02/18/2013    EF 35-30% with anterolateral, lateral and inferolateral/apical hypokinesis (Takotsubo)   . Transthoracic echocardiogram  02/25/2013    EF 55%. No clear regional wall motion abnormalities. Grade 3 diastolic dysfunction. Mild MR    MEDICATIONS AND ALLERGIES REVIEWED IN EPIC --  changes include carvedilol now total twice a day, losartan 100 mg daily. No Change in Social and Family History  ROS: A comprehensive Review of Systems - Negative except Mild arthritis pains. Rapid heartbeat episodes and lightheadedness as noted above. Otherwise negative not indicated at the  PHYSICAL EXAM BP 114/76  Pulse 87  Ht 5\' 3"  (1.6 m)  Wt 150 lb 11.2 oz (68.357 kg)  BMI 26.70 kg/m2 General appearance: alert, cooperative, appears stated age, no distress and Well-nourished and well-groomed. She has a brace in place.  Neck: no adenopathy, no carotid bruit, no JVD, supple, symmetrical, trachea midline and  HEENT: [place]/at T., EOMI, MMM, anicteric sclera.  Lungs: CTAB, normal percussion bilaterally and Nonlabored, good air movement  Heart: RRR, S1, S2 normal and Soft 1/6 HSM at apex. Otherwise no M./R./G.  Abdomen: soft, non-tender; bowel sounds normal; no masses, no organomegaly  Extremities: extremities normal, atraumatic, no cyanosis or edema  Pulses: 2+ and symmetric  Neurologic: Grossly normal   Adult ECG Report  Rate: 87 ;  Rhythm: normal sinus rhythm with sinus arrhythmia  QRS Axis: 81 ;  PR Interval: 162 ;  QRS Duration: 84 ; QTc: 433;  Voltages: Normal  Conduction Disturbances: none  Other Abnormalities: No longer T wave inversions in lateral leads.   Narrative Interpretation: Relatively normal EKG  Recent Labs none   ASSESSMENT / PLAN: Palpitations She is having these frequent episodes of palpitations, and was told that is a possibility of atrial fibrillation or flutter. Basilar happening without frequency they wish should hopefully be able to capture an episode.  Plan: Cardiac Event Monitor, 30 days  Takotsubo cardiomyopathy -- postop stress-induced: Resolved No residual systolic dysfunction, however there was evidence of diastolic dysfunction. I like to check an echocardiogram after I see her back sometime in  November to December of this year just to see if there is still evidence of significant diastolic dysfunction. That may be why she is a little short of breath when she feels her heart racing. She is on good dose of beta blocker and ARB.  HTN (hypertension) Much better controlled on increased dose of ARB and beta blocker.  Chest pain with moderate risk for cardiac etiology These occurrences are having less and less frequently. I don't see that she is currently on Imdur. We'll simply use when necessary nitroglycerin it is not that frequent.  Dyslipidemia, goal LDL below 70 Reduced down to 10 mg atorvastatin.  Hasn't been followed by her PCP. She should be due for labs soon.    Orders Placed This Encounter  Procedures  . EKG 12-Lead  . Cardiac event monitor    Standing Status: Future     Number of Occurrences: 1     Standing Expiration Date: 07/08/2014   Meds ordered this encounter  Medications  . atorvastatin (LIPITOR) 10 MG tablet    Sig: Take 10 mg by mouth daily.  . carvedilol (COREG) 12.5 MG tablet    Sig: Take 12.5 mg by mouth 2 (two) times daily  with a meal.  . losartan (COZAAR) 100 MG tablet    Sig: Take 100 mg by mouth daily.    Followup: 6-8 weeks   DAVID W. Ellyn Hack, M.D., M.S. Interventional Cardiologist [facility]

## 2013-07-08 NOTE — Patient Instructions (Signed)
You will wear for 30 days. Your physician has recommended that you wear an event monitor. Event monitors are medical devices that record the heart's electrical activity. Doctors most often [place] these monitors to diagnose arrhythmias. Arrhythmias are problems with the speed or rhythm of the heartbeat. The monitor is a small, portable device. You can wear one while you do your normal daily activities. This is usually used to diagnose what is causing palpitations/syncope (passing out).  Your physician wants you to follow-up in: 6-8 weeks Dr Ellyn Hack.  You will receive a reminder letter in the mail two months in advance. If you don't receive a letter, please call our office to schedule the follow-up appointment.

## 2013-07-08 NOTE — Assessment & Plan Note (Signed)
She is having these frequent episodes of palpitations, and was told that is a possibility of atrial fibrillation or flutter. Basilar happening without frequency they wish should hopefully be able to capture an episode.  Plan: Cardiac Event Monitor, 30 days

## 2013-08-19 ENCOUNTER — Telehealth: Payer: Self-pay | Admitting: *Deleted

## 2013-08-19 NOTE — Telephone Encounter (Signed)
Called no answer ,unable to leave phone message. Will call back

## 2013-08-19 NOTE — Telephone Encounter (Signed)
Spoke to patient. Result given . Verbalized understanding  

## 2013-08-19 NOTE — Telephone Encounter (Signed)
Message copied by Raiford Simmonds on Thu Aug 19, 2013  9:42 AM ------      Message from: [name]      Created: Wed Aug 18, 2013  8:26 AM       CARDIAC EVENT MONITOR RESULT  5/7-6/6 2015      Rhythm - mostly NSR 70-100, with occasional S Tachy ~100-115      Rare PACs & PVCs - not recorded as symptomatic      No arrhythmias       No abnormal findings correlating with symptoms.            [name], MD                   ------

## 2013-08-19 NOTE — Telephone Encounter (Signed)
Left message to call back Concerning monitor results.

## 2013-08-19 NOTE — Telephone Encounter (Signed)
Patient returned your call---please call [phone]

## 2013-08-25 ENCOUNTER — Ambulatory Visit: Payer: Medicare Other | Admitting: Cardiology

## 2013-09-13 ENCOUNTER — Ambulatory Visit (INDEPENDENT_AMBULATORY_CARE_PROVIDER_SITE_OTHER): Payer: Medicare Other | Admitting: Cardiology

## 2013-09-13 ENCOUNTER — Encounter: Payer: Self-pay | Admitting: Cardiology

## 2013-09-13 VITALS — BP 122/72 | HR 66 | Ht 63.0 in | Wt 150.9 lb

## 2013-09-13 DIAGNOSIS — E785 Hyperlipidemia, unspecified: Secondary | ICD-10-CM

## 2013-09-13 DIAGNOSIS — I428 Other cardiomyopathies: Secondary | ICD-10-CM

## 2013-09-13 DIAGNOSIS — I5181 Takotsubo syndrome: Secondary | ICD-10-CM

## 2013-09-13 DIAGNOSIS — I10 Essential (primary) hypertension: Secondary | ICD-10-CM

## 2013-09-13 DIAGNOSIS — R002 Palpitations: Secondary | ICD-10-CM

## 2013-09-13 MED ORDER — METOPROLOL SUCCINATE ER 100 MG PO TB24: 100.0000 mg | ORAL_TABLET | Freq: Every day | ORAL | Status: DC

## 2013-09-13 NOTE — Patient Instructions (Signed)
Your physician has requested that you have an echocardiogram. Echocardiography is a painless test that uses sound waves to create images of your heart. It provides your doctor with information about the size and shape of your heart and how well your heart's chambers and valves are working. This procedure takes approximately one hour. There are no restrictions for this procedure.  STOP CARVEDILOL  START Metoprolol SUCC 100 MG --for the 1st  4 days take 1/2 tablet daily then increase to 100 mg. While increase the metoprolol take Losartan 50 mg  Until you go to 100 mg.  BLOOD PRESSURE CHECK IN 1 WEEK.   KEEP HYDRATED.  Your physician wants you to follow-up in [place] 2015 DR HARDING AFTER ECHO (NOT ON THE SAME DAY).  You will receive a reminder letter in the mail two months in advance. If you don't receive a letter, please call our office to schedule the follow-up appointment.

## 2013-09-13 NOTE — Progress Notes (Signed)
PCP: Gilford Rile, MD  Clinic Note: Chief Complaint  Patient presents with  . Dizziness    Lightheaded with fall   HPI: Sarah Flores is a 68 y.o. 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 She actually had symptoms concerning for possible STEMI with hypotension. After stabilization, she was taken to the Cath Lab and found to have relatively normal coronary arteries. Her initial EF was in the 20-30% range with anterolateral lateral and inferolateral/apical hypokinesis. This was all but resolved by 6 days later. I last saw her in May for palpitations. The question of possible atrial fibrillation. I did have her wear a monitor results below. There was no evidence to suggest atrial fibrillation. She has completed cardiac rehabilitation.  CARDIAC EVENT MONITOR RESULT 5/7-6/6 2015 Rhythm - mostly NSR 70-100, with occasional S Tachy ~100-115 Rare PACs & PVCs - not recorded as symptomatic No arrhythmias  No abnormal findings correlating with symptoms.  Interval History: Since her last visit, she really hasn't had much in the way of any chest discomfort except for the weekend of July 4 that she had a couple episodes of prolonged discomfort. Other than that the main thing she notes is that of fluttering in her heart using incision I really wouldn't do much. She says down a lot because she feels dizzy and lightheaded. She is scared to fall. She has not had any syncope or TIA/amaurosis TIA symptoms. She has felt dizzy but not really true presyncope. The discomfort she has in her chest is usually associated with rapid heart beats. Because she is not overly active and is hard to tell if this is exertional or not. As the PND or orthopnea. Trace edema. She denies any exertional chest discomfort now. No melena, hematochezia, hematuria, epistaxis. No claudication. She has had a  couple more so chest discomfort, usually at rest alleviated with nitroglycerin. She is in addition to 2 times since her ER visit.  Past Medical History  Diagnosis Date  . 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 PONV (postoperative nausea and vomiting)   . Hypertension   . Chronic kidney disease     abnormal labs  . Fibromyalgia   . Family history of anesthesia complication     "son goes crazy; hits people" (02/18/2013)  . 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 Pneumonia 1950; ~ 1960; ~ 1967  . Chronic bronchitis     "usually get it q other year" (02/18/2013)  . Type II diabetes mellitus   . Arthritis     "all my joints" (02/18/2013)  . 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  STE improved with NTG   . HTN (hypertension) 02/19/2013  . Dyslipidemia, goal LDL below 70 02/19/2013  . Diabetes 02/19/2013  . Cardiomyopathy, postop stress  02/23/2013    TakoTsubo - with transient STEMI; followed by acute HF/cardiogenic shock -- now resolved & EF back to normal   Past Cardiovascular Evaluation/Procedure History  Procedure Laterality Date  . Cardiac catheterization  1990 X2  . Cardiac catheterization  02/22/2013    Nonischemic; no notable CAD. EF 40%.  . Transthoracic echocardiogram  02/18/2013    EF 35-30% with anterolateral, lateral and  inferolateral/apical hypokinesis (Takotsubo)   . Transthoracic echocardiogram  02/25/2013    EF 55%. No clear regional wall motion abnormalities. Grade 3 diastolic dysfunction. Mild MR   MEDICATIONS AND ALLERGIES REVIEWED IN EPIC --  No Change in Social and Family History  ROS: A comprehensive Review of Systems - Negative except Mild arthritis pains. Rapid heartbeat episodes and lightheadedness as noted above. She does have mild mild  cramps that have been going on since long before she started atorvastatin. Otherwise negative not indicated in the history of present illness  PHYSICAL EXAM BP 122/72  Pulse 66  Ht 5\' 3"  (1.6 m)  Wt 150 lb 14.4 oz (68.448 kg)  BMI 26.74 kg/m2 General appearance: alert, cooperative, appears stated age, no distress and Well-nourished and well-groomed. She has a brace in place.  Neck: no adenopathy, no carotid bruit, no JVD, supple, symmetrical, trachea midline and  HEENT: [place]/at T., EOMI, MMM, anicteric sclera.  Lungs: CTAB, normal percussion bilaterally and Nonlabored, good air movement  Heart: RRR, S1, S2 normal and Soft 1/6 HSM at apex. Otherwise no M./R./G.  Abdomen: soft, non-tender; bowel sounds normal; no masses, no organomegaly  Extremities: extremities normal, atraumatic, no cyanosis or edema  Pulses: 2+ and symmetric  Neurologic: Grossly normal   Adult ECG Report  Rate: 70 ;  Rhythm: normal sinus rhythm ;  Narrative Interpretation: Relatively normal EKG  Recent Labs none   ASSESSMENT / PLAN: Palpitations No evidence of A. fib, but notable sinus tachycardia with PACs and PVCs apparently for a while the monitor would not download.  Since she is having dizzy symptoms that he be treated to her palpitations or simply orthostatic hypotension, I would switch her from carvedilol to metoprolol which is better for controlling PACs/PVCs with left blood pressure modification. The conversion of total of 12.5 mg twice a day of carvedilol is probably somewhere between 50 and 100 mg daily Toprol. I think she'll be better off with a once daily long-term medication.  Plan for now is to DC carvedilol and switched to Toprol which she will start at 50 mg daily and hopefully increase to 100 mg. In the interim I will have her reduce the losartan to 50 mg to avoid hypotension. She will come in for blood pressure check after one week at which time we'll see she'll not tolerate the titration  Takotsubo  cardiomyopathy -- postop stress-induced: Resolved Echo showed relatively improved EF. Plan will be to recheck 1 year following the event which would be in December. There was one concern for significant systolic dysfunction of both will improve. She definitely has dyspnea with her tachycardia which is probably related to diastolic dysfunction.  Essential hypertension Well controlled. Hopefully with converting from one beta blocker to the other we will lose control. She will return for nursing visit blood pressure check next week  Dyslipidemia, goal LDL below 70  On statin. Monitored by PCP.    Orders Placed This Encounter  Procedures  . EKG 12-Lead  . 2D Echocardiogram without contrast    Palp,  takosudo    Standing Status: Future     Number of Occurrences:      Standing Expiration Date: 09/13/2014    Order Specific Question:  Type of Echo    Answer:  Complete    Order Specific Question:  Where should this test be performed    Answer:  [facility]    Order Specific Question:  Reason for exam-Echo    Answer:  Myocardial Infact-old  410.9    Order Specific Question:  Reason for exam-Echo    Answer:  Other - See Comments Section    Order Specific Question:  Reason for exam-Echo    Answer:  Cardiomyopathy-Unspecified  425.9   Meds ordered this encounter  Medications  . metoprolol succinate (TOPROL-XL) 100 MG 24 hr tablet    Sig: Take 1 tablet (100 mg total) by mouth daily. Take with or immediately following a meal.    Dispense:  90 tablet    Refill:  3    Followup: Roughly December timeframe, following tachycardia   Caliana Spires W. Ellyn Hack, M.D., M.S. Interventional Cardiologist [facility]

## 2013-09-21 NOTE — Assessment & Plan Note (Signed)
No evidence of A. fib, but notable sinus tachycardia with PACs and PVCs apparently for a while the monitor would not download.  Since she is having dizzy symptoms that he be treated to her palpitations or simply orthostatic hypotension, I would switch her from carvedilol to metoprolol which is better for controlling PACs/PVCs with left blood pressure modification. The conversion of total of 12.5 mg twice a day of carvedilol is probably somewhere between 50 and 100 mg daily Toprol. I think she'll be better off with a once daily long-term medication.  Plan for now is to DC carvedilol and switched to Toprol which she will start at 50 mg daily and hopefully increase to 100 mg. In the interim I will have her reduce the losartan to 50 mg to avoid hypotension. She will come in for blood pressure check after one week at which time we'll see she'll not tolerate the titration

## 2013-09-21 NOTE — Assessment & Plan Note (Signed)
Well controlled. Hopefully with converting from one beta blocker to the other we will lose control. She will return for nursing visit blood pressure check next week

## 2013-09-21 NOTE — Assessment & Plan Note (Signed)
Echo showed relatively improved EF. Plan will be to recheck 1 year following the event which would be in December. There was one concern for significant systolic dysfunction of both will improve. She definitely has dyspnea with her tachycardia which is probably related to diastolic dysfunction.

## 2013-09-21 NOTE — Assessment & Plan Note (Signed)
On statin. Monitored by PCP. 

## 2013-09-29 ENCOUNTER — Ambulatory Visit: Payer: Medicare Other | Admitting: Pharmacist Clinician (PhC)/ Clinical Pharmacy Specialist

## 2013-10-01 ENCOUNTER — Ambulatory Visit: Payer: Medicare Other | Admitting: Pharmacist Clinician (PhC)/ Clinical Pharmacy Specialist

## 2013-10-04 ENCOUNTER — Encounter: Payer: Self-pay | Admitting: Pharmacist Clinician (PhC)/ Clinical Pharmacy Specialist

## 2013-10-04 ENCOUNTER — Ambulatory Visit (INDEPENDENT_AMBULATORY_CARE_PROVIDER_SITE_OTHER): Payer: Medicare Other | Admitting: Pharmacist Clinician (PhC)/ Clinical Pharmacy Specialist

## 2013-10-04 VITALS — BP 128/72 | HR 76 | Ht 63.0 in | Wt 150.7 lb

## 2013-10-04 DIAGNOSIS — I10 Essential (primary) hypertension: Secondary | ICD-10-CM

## 2013-10-04 NOTE — Patient Instructions (Addendum)
Your blood pressure today is 128/72  (goal <140/90)  Check your blood pressure at home several times each week and keep record of the readings.  Bring all of your meds, your BP cuff and your record of home blood pressures to your next appointment.  Exercise as you're able, try to walk approximately 30 minutes per day.  Keep salt intake to a minimum, especially watch canned and prepared boxed foods.  Eat more fresh fruits and vegetables and fewer canned items.  Avoid eating in fast food restaurants.    HOW TO TAKE YOUR BLOOD PRESSURE:   Rest 5 minutes before taking your blood pressure.    Don't smoke or drink caffeinated beverages for at least 30 minutes before.   Take your blood pressure before (not after) you eat.   Sit comfortably with your back supported and both feet on the floor (don't cross your legs).   Elevate your arm to heart level on a table or a desk.   Use the proper sized cuff. It should fit smoothly and snugly around your bare upper arm. There should be enough room to slip a fingertip under the cuff. The bottom edge of the cuff should be 1 inch above the crease of the elbow.   Ideally, take 3 measurements at one sitting and record the average.

## 2013-10-04 NOTE — Progress Notes (Signed)
10/04/2013 Sarah Flores Nov 12, 1945 [id number]   HPI:  Sarah Flores is a 68 y.o. female patient of Dr Ellyn Hack, with a PMH below who presents today for a blood pressure check.  She has had problems in the past month or two with dizziness, but no presyncope.  At her visit with Dr. Ellyn Hack, she was still having some sinus tachycardia with PAC's and PVCs.  He switched her from carvedilol to metoprolol, tapering from 50mg  to 100mg  after several doses.  Today she states she feels better, less dizziness, but still occurs at times.  She states this is mostly with quick head movements or standing.  Her home BP readings have been consistently in the 120-140s/70s.  She did not bring her home BP cuff in to day for calibration, but states that it is about 66 years old, an [employer] wrist monitor.  Sarah Flores does not use tobacco products, nor does she drink alcohol or caffeine.  She has tried cutting salt out of her diet, but admits that she uses "light" salt on some foods.  She has been regularly mall-walking, about 2-3 times per week, as well as riding a stationary bike for 15 minutes 1-2 times per week.      Current Outpatient Prescriptions  Medication Sig Dispense Refill  . atorvastatin (LIPITOR) 10 MG tablet Take 10 mg by mouth daily.      . DULoxetine (CYMBALTA) 60 MG capsule Take 60 mg by mouth daily.      Marland Kitchen glipiZIDE (GLUCOTROL XL) 10 MG 24 hr tablet Take 10 mg by mouth 2 (two) times daily.      Marland Kitchen HYDROcodone-acetaminophen (NORCO) 7.5-325 MG per tablet Take 1 tablet by mouth every 6 (six) hours as needed (for pain).  41 tablet  0  . losartan (COZAAR) 100 MG tablet Take 100 mg by mouth daily.      . metFORMIN (GLUCOPHAGE) 1000 MG tablet Take 500 mg by mouth 2 (two) times daily with a meal.       . metoprolol succinate (TOPROL-XL) 100 MG 24 hr tablet Take 1 tablet (100 mg total) by mouth daily. Take with or immediately following a meal.  90 tablet  3  . nitroGLYCERIN (NITROSTAT) 0.4 MG SL tablet Place  1 tablet (0.4 mg total) under the tongue every 5 (five) minutes as needed for chest pain.  25 tablet  6  . sitaGLIPtin (JANUVIA) 100 MG tablet Take 100 mg by mouth daily.      Marland Kitchen triamterene-hydrochlorothiazide (MAXZIDE-25) 37.5-25 MG per tablet Take 1 tablet by mouth daily.      . Vitamin D, Ergocalciferol, (DRISDOL) 50000 UNITS CAPS capsule Take 50,000 Units by mouth every 7 (seven) days. Wednesdays       No current facility-administered medications for this visit.    Allergies  Allergen Reactions  . Penicillins Anaphylaxis  . Benzoin     Burning - "looked like a 2nd degree burn"    Past Medical History  Diagnosis Date  . 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 PONV (postoperative nausea and vomiting)   . Hypertension   . Chronic kidney disease     abnormal labs  . Fibromyalgia   . Family history of anesthesia complication     "son goes crazy; hits people" (02/18/2013)  . 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  Pneumonia 1950; ~ 1960; ~ 1967  . Chronic bronchitis     "usually get it q other year" (02/18/2013)  . Type II diabetes mellitus   . Arthritis     "all my joints" (02/18/2013)  . 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  STE improved with NTG   . HTN (hypertension) 02/19/2013  . Dyslipidemia, goal LDL below 70 02/19/2013  . Diabetes 02/19/2013  . Cardiomyopathy, postop stress  02/23/2013    TakoTsubo - with transient STEMI; followed by acute HF/cardiogenic shock -- now resolved & EF back to normal    Blood pressure 128/72, pulse 76, height 5\' 3"  (1.6 m), weight 150 lb 11.2 oz (68.357 kg).   ASSESSMENT AND PLAN: Today her BP is fine, within her goal of <140/90 as a diabetic.  She is apparently did not decrease the losartan from 100mg  to 50mg , so we will  leave that as is.  I have asked her to continue monitoring her home blood pressure several times per week, and to let us know if it consistently is >800 systolic.  Some of her dizziness may be inner ear issues, as she notes it most when turning her head quickly.  I advised that she try not to do that, or at least grab something to hold on to when she does.  Patient understands to call if she has further concerns.  Tommy Medal PharmD CPP [facility]

## 2013-10-05 NOTE — Progress Notes (Signed)
Agree with plan.  [name], MD

## 2013-10-12 ENCOUNTER — Telehealth: Payer: Self-pay | Admitting: *Deleted

## 2013-10-12 DIAGNOSIS — Z79899 Other long term (current) drug therapy: Secondary | ICD-10-CM

## 2013-10-12 DIAGNOSIS — E782 Mixed hyperlipidemia: Secondary | ICD-10-CM

## 2013-10-12 NOTE — Telephone Encounter (Signed)
Message copied by Raiford Simmonds on Tue Oct 12, 2013 11:03 AM ------      Message from: [place]: Thu May 13, 2013  3:10 PM       LABS CMP [facility] FROM 3/12 15             MAILED IN AUG 2015 ------

## 2013-10-12 NOTE — Telephone Encounter (Signed)
Mail and letter,labslip- CMP,LIPID

## 2014-02-10 ENCOUNTER — Encounter (HOSPITAL_COMMUNITY): Payer: Self-pay | Admitting: Cardiovascular Disease

## 2014-02-16 ENCOUNTER — Ambulatory Visit (HOSPITAL_COMMUNITY)
Admission: RE | Admit: 2014-02-16 | Discharge: 2014-02-16 | Disposition: A | Discharge status: Home / Self Care | Payer: Medicare Other | Source: Ambulatory Visit | Attending: Internal Medicine | Admitting: Internal Medicine

## 2014-02-16 DIAGNOSIS — R002 Palpitations: Secondary | ICD-10-CM | POA: Insufficient documentation

## 2014-02-16 DIAGNOSIS — I10 Essential (primary) hypertension: Secondary | ICD-10-CM | POA: Insufficient documentation

## 2014-02-16 DIAGNOSIS — N189 Chronic kidney disease, unspecified: Secondary | ICD-10-CM | POA: Diagnosis not present

## 2014-02-16 DIAGNOSIS — R42 Dizziness and giddiness: Secondary | ICD-10-CM | POA: Diagnosis not present

## 2014-02-16 DIAGNOSIS — E785 Hyperlipidemia, unspecified: Secondary | ICD-10-CM | POA: Diagnosis not present

## 2014-02-16 DIAGNOSIS — I5181 Takotsubo syndrome: Secondary | ICD-10-CM | POA: Insufficient documentation

## 2014-02-16 DIAGNOSIS — E119 Type 2 diabetes mellitus without complications: Secondary | ICD-10-CM | POA: Insufficient documentation

## 2014-02-16 DIAGNOSIS — I369 Nonrheumatic tricuspid valve disorder, unspecified: Secondary | ICD-10-CM

## 2014-02-16 NOTE — Progress Notes (Signed)
2D Echo Performed 02/16/2014    Sarah Flores, RCS

## 2014-02-16 NOTE — Progress Notes (Signed)
Quick Note:  Echo results: Good news: Essentially normal echocardiogram and normal pump function and normal valve function. Back to normal function.  EF: 55-60%. No regional wall motion abnormalities. No signs to suggest heart attack.  HARDING, DAVID W    ______

## 2014-02-17 ENCOUNTER — Telehealth: Payer: Self-pay | Admitting: *Deleted

## 2014-02-17 NOTE — Telephone Encounter (Signed)
Left message to call back Release to [facility] chart

## 2014-02-17 NOTE — Telephone Encounter (Signed)
Spoke to patient. Result given . Verbalized understanding Appointment 02/22/14 patient states she will be here

## 2014-02-17 NOTE — Telephone Encounter (Signed)
Pt is returning Sharon's call °

## 2014-02-17 NOTE — Telephone Encounter (Signed)
-----   Message from [name], MD sent at 02/16/2014 11:52 PM EST ----- Echo results: Good news: Essentially normal echocardiogram and normal pump function and normal valve function.  Back to normal function.   EF: 55-60%. No regional wall motion abnormalities.  No signs to suggest heart attack.  Sarah Flores, Sarah Flores

## 2014-02-22 ENCOUNTER — Ambulatory Visit (INDEPENDENT_AMBULATORY_CARE_PROVIDER_SITE_OTHER): Payer: Medicare Other | Admitting: Cardiology

## 2014-02-22 ENCOUNTER — Encounter: Payer: Self-pay | Admitting: Cardiology

## 2014-02-22 ENCOUNTER — Other Ambulatory Visit: Payer: Self-pay | Admitting: *Deleted

## 2014-02-22 VITALS — BP 131/79 | HR 76 | Ht 63.0 in | Wt 153.2 lb

## 2014-02-22 DIAGNOSIS — E785 Hyperlipidemia, unspecified: Secondary | ICD-10-CM

## 2014-02-22 DIAGNOSIS — I201 Angina pectoris with documented spasm: Secondary | ICD-10-CM

## 2014-02-22 DIAGNOSIS — R079 Chest pain, unspecified: Secondary | ICD-10-CM

## 2014-02-22 DIAGNOSIS — I10 Essential (primary) hypertension: Secondary | ICD-10-CM

## 2014-02-22 DIAGNOSIS — I429 Cardiomyopathy, unspecified: Secondary | ICD-10-CM

## 2014-02-22 DIAGNOSIS — I5181 Takotsubo syndrome: Secondary | ICD-10-CM

## 2014-02-22 DIAGNOSIS — R002 Palpitations: Secondary | ICD-10-CM

## 2014-02-22 MED ORDER — ISOSORBIDE MONONITRATE ER 30 MG PO TB24: 30.0000 mg | ORAL_TABLET | Freq: Every day | ORAL | Status: DC

## 2014-02-22 NOTE — Patient Instructions (Signed)
Start imdur (isosorbide mn) 30mg  one tablet daily  atke after daily aspirin- 30 minutes   Your physician wants you to follow-up in 4-6 MONTH Dr Ellyn Hack.  You will receive a reminder letter in the mail two months in advance. If you don't receive a letter, please call our office to schedule the follow-up appointment.

## 2014-02-22 NOTE — Progress Notes (Signed)
PCP: Gilford Rile, MD  Clinic Note: Chief Complaint  Patient presents with  . Follow-up    3 month follow up, Pt. experiencing chest pain, chest pressure, and SOB, Pt. denies swelling.   HPI: Sarah Flores is a 68 y.o. female with a history of postoperative Takotsubo cardiomyopathy with transient ST elevations but it angiographic normal coronary arteries. Her ejection fraction recuperated within a few weeks and is now essentially back to normal. She still has intermittent chest discomfort episodes that may be related to spasm. She's had a cardiac monitor did not show any significant arrhythmias just some occasional sinus tachycardia in the past. I last saw her in July 2015. She saw Tommy Medal, RPH-CPP a month later for blood pressure evaluation. No adjustments were made.  Interval History:  Since her last visit, she has had intermittent episodes of chest tightness and pressure are usually alleviated by nitroglycerin. She had an episode on Saturday that was quite significant that occurred with no major activity. It lasted a few minutes and was relieved with nitroglycerin rapidly. She also occasionally notes some rapid heart rate episodes, but denies any syncope or near-syncope. She really doesn't note exertional chest tightness or pressure, and does not have exertional dyspnea. No PND, orthopnea but does have mild intermittent edema.  No TIA/amaurosis fugax symptoms or claudication symptoms.  Past Medical History  Diagnosis Date  . 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 PONV (postoperative nausea and vomiting)   . Hypertension   . Chronic kidney disease     abnormal labs  . Fibromyalgia   . Family history of anesthesia complication     "son goes crazy; hits people" (02/18/2013)  . 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 Pneumonia 1950; ~ 1960; ~ 1967   . Chronic bronchitis     "usually get it q other year" (02/18/2013)  . Type II diabetes mellitus   . Arthritis     "all my joints" (02/18/2013)  . 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  STE improved with NTG   . HTN (hypertension) 02/19/2013  . Dyslipidemia, goal LDL below 70 02/19/2013  . Diabetes 02/19/2013  . Cardiomyopathy, postop stress  02/23/2013    TakoTsubo - with transient STEMI; followed by acute HF/cardiogenic shock -- now resolved & EF back to normal   Past Cardiovascular Evaluation/Procedure History  Procedure Laterality Date  . Cardiac catheterization  1990 X2  . Cardiac catheterization  02/22/2013    Nonischemic; no notable CAD. EF 40%.  . Transthoracic echocardiogram  02/18/2013    EF 35-30% with anterolateral, lateral and inferolateral/apical hypokinesis (Takotsubo)   . Transthoracic echocardiogram  02/16/2014    EF 55-60%. No clear regional wall motion abnormalities. Grade 1 diastolic dysfunction. Trivial MR   Current Outpatient Prescriptions on File Prior to Visit  Medication Sig Dispense Refill  . atorvastatin (LIPITOR) 10 MG tablet Take 10 mg by mouth daily.    . DULoxetine (CYMBALTA) 60 MG capsule Take 60 mg by mouth daily.    Marland Kitchen glipiZIDE (GLUCOTROL XL) 10 MG 24 hr tablet Take 10 mg by mouth 2 (two) times daily.    Marland Kitchen HYDROcodone-acetaminophen (NORCO) 7.5-325 MG per tablet Take 1 tablet by mouth every 6 (six) hours as needed (for pain). 41 tablet 0  .  losartan (COZAAR) 100 MG tablet Take 100 mg by mouth daily.    . metFORMIN (GLUCOPHAGE) 1000 MG tablet Take 500 mg by mouth 2 (two) times daily with a meal.     . metoprolol succinate (TOPROL-XL) 100 MG 24 hr tablet Take 1 tablet (100 mg total) by mouth daily. Take with or immediately following a meal. 90 tablet 3  . nitroGLYCERIN (NITROSTAT) 0.4 MG SL tablet Place 1 tablet (0.4 mg total) under the tongue every 5 (five) minutes  as needed for chest pain. 25 tablet 6  . sitaGLIPtin (JANUVIA) 100 MG tablet Take 100 mg by mouth daily.    Marland Kitchen triamterene-hydrochlorothiazide (MAXZIDE-25) 37.5-25 MG per tablet Take 1 tablet by mouth daily.    . Vitamin D, Ergocalciferol, (DRISDOL) 50000 UNITS CAPS capsule Take 50,000 Units by mouth every 7 (seven) days. Wednesdays     No current facility-administered medications on file prior to visit.   Allergies  Allergen Reactions  . Penicillins Anaphylaxis  . Benzoin     Burning - "looked like a 2nd degree burn"    No Change in Social and Family History  ROS: A comprehensive Review of Systems -was performed; If not mentioned in HPI: Review of Systems  Constitutional: Negative for malaise/fatigue.  HENT: Negative for nosebleeds.   Respiratory: Negative for shortness of breath and wheezing.   Cardiovascular: Positive for chest pain and palpitations (Rapid heartbeats). Negative for claudication, leg swelling and PND.  Gastrointestinal: Negative for constipation, blood in stool and melena.  Genitourinary: Negative for urgency.  Musculoskeletal: Positive for joint pain (mild arthritis aches and pains).  Neurological: Negative for dizziness, speech change, focal weakness, seizures, loss of consciousness and headaches.  Endo/Heme/Allergies: Does not bruise/bleed easily.  Psychiatric/Behavioral: Negative for memory loss. The patient is nervous/anxious. The patient does not have insomnia.   All other systems reviewed and are negative.    PHYSICAL EXAM BP 131/79 mmHg  Pulse 76  Ht 5\' 3"  (1.6 m)  Wt 153 lb 3.2 oz (69.491 kg)  BMI 27.14 kg/m2 General appearance: alert, cooperative, appears stated age, no distress and Well-nourished and well-groomed. She has a brace in place.  Neck: no adenopathy, no carotid bruit, no JVD, supple, symmetrical, trachea midline and  HEENT: [place]/at T., EOMI, MMM, anicteric sclera.  Lungs: CTAB, normal percussion bilaterally and Nonlabored, good air  movement  Heart: RRR, S1, S2 normal and Soft 1/6 HSM at apex. Otherwise no M./R./G.  Abdomen: soft, non-tender; bowel sounds normal; no masses, no organomegaly  Extremities: extremities normal, atraumatic, no cyanosis or edema  Pulses: 2+ and symmetric  Neurologic: Grossly normal   Adult ECG Report  Rate: 76 ;  Rhythm: normal sinus rhythm ;  Narrative Interpretation: Relatively normal EKG  Recent Labs none  ASSESSMENT / PLAN: Overall stable from a cardiac standpoint. No recurrent heart failure symptoms. She does have intermittent chest discomfort episodes that sound like they may very will potentially be related to coronary spasm. This may have been the etiology for her Takotsubo cardiomyopathy. Her ejection fraction is essentially recovered back to baseline normal.  Takotsubo cardiomyopathy -- postop stress-induced: Resolved Essentially resolved. Now with normal echocardiogram. This may have been related to coronary spasm, from an etiology standpoint. Would continue beta blocker to avoid stress-induced spasm.  Coronary artery spasm Of course I cannot be sure, but I would concerned that potentially some of her nitroglycerin responsive chest pain could be related to coronary spasm. Relatively normal coronaries by angiography. Plan: Start low-dose Imdur. Would potentially consider  amlodipine as well. Blood pressure tolerates. Continue other cardiac risk factor modification with statin and aspirin.   Essential hypertension Well-controlled blood pressure on ARB and beta blocker. She does have blood pressure room for Imdur. May have some more room for amlodipine if additional symptom relief is required.  Dyslipidemia, goal LDL below 70 On low-dose statin. Monitored by PCP.  Chest pain with moderate risk for cardiac etiology Chest pain is relieved by nitroglycerin. As indicated above this may be related to coronary spasm. Will start with Imdur and then consider either increasing the dose  versus adding amlodipine for additional spasm relief.  Palpitations Relatively stable. She is on a standing dose of beta blocker at relatively high dose.    Orders Placed This Encounter  Procedures  . EKG 12-Lead   Meds ordered this encounter  Medications  . doxycycline (VIBRA-TABS) 100 MG tablet    Sig: Take 100 mg by mouth 2 (two) times daily.    Refill:  0  . isosorbide mononitrate (IMDUR) 30 MG 24 hr tablet    Sig: Take 1 tablet (30 mg total) by mouth daily.    Dispense:  90 tablet    Refill:  3    Followup: Roughly December timeframe, following tachycardia   Ozell Ferrera W. Ellyn Hack, M.D., M.S. Interventional Cardiologist [facility]

## 2014-02-23 ENCOUNTER — Encounter: Payer: Self-pay | Admitting: Cardiology

## 2014-02-23 DIAGNOSIS — I201 Angina pectoris with documented spasm: Secondary | ICD-10-CM | POA: Insufficient documentation

## 2014-02-23 NOTE — Assessment & Plan Note (Signed)
Chest pain is relieved by nitroglycerin. As indicated above this may be related to coronary spasm. Will start with Imdur and then consider either increasing the dose versus adding amlodipine for additional spasm relief.

## 2014-02-23 NOTE — Assessment & Plan Note (Signed)
Relatively stable. She is on a standing dose of beta blocker at relatively high dose.

## 2014-02-23 NOTE — Assessment & Plan Note (Signed)
On low-dose statin. Monitored by PCP.

## 2014-02-23 NOTE — Assessment & Plan Note (Signed)
Well-controlled blood pressure on ARB and beta blocker. She does have blood pressure room for Imdur. May have some more room for amlodipine if additional symptom relief is required.

## 2014-02-23 NOTE — Assessment & Plan Note (Signed)
Essentially resolved. Now with normal echocardiogram. This may have been related to coronary spasm, from an etiology standpoint. Would continue beta blocker to avoid stress-induced spasm.

## 2014-02-23 NOTE — Assessment & Plan Note (Signed)
Of course I cannot be sure, but I would concerned that potentially some of her nitroglycerin responsive chest pain could be related to coronary spasm. Relatively normal coronaries by angiography. Plan: Start low-dose Imdur. Would potentially consider amlodipine as well. Blood pressure tolerates. Continue other cardiac risk factor modification with statin and aspirin.

## 2014-03-08 ENCOUNTER — Encounter (HOSPITAL_COMMUNITY): Payer: Self-pay

## 2014-03-08 ENCOUNTER — Observation Stay (HOSPITAL_COMMUNITY)
Admission: AD | Admit: 2014-03-08 | Discharge: 2014-03-09 | Disposition: A | Discharge status: Home / Self Care | Payer: Medicare Other | Source: Other Acute Inpatient Hospital | Attending: Emergency Medicine | Admitting: Emergency Medicine

## 2014-03-08 DIAGNOSIS — Z9889 Other specified postprocedural states: Secondary | ICD-10-CM | POA: Diagnosis not present

## 2014-03-08 DIAGNOSIS — Z79899 Other long term (current) drug therapy: Secondary | ICD-10-CM | POA: Diagnosis not present

## 2014-03-08 DIAGNOSIS — R0602 Shortness of breath: Secondary | ICD-10-CM | POA: Diagnosis not present

## 2014-03-08 DIAGNOSIS — R0789 Other chest pain: Secondary | ICD-10-CM | POA: Diagnosis present

## 2014-03-08 DIAGNOSIS — J42 Unspecified chronic bronchitis: Secondary | ICD-10-CM | POA: Diagnosis not present

## 2014-03-08 DIAGNOSIS — IMO0002 Reserved for concepts with insufficient information to code with codable children: Secondary | ICD-10-CM | POA: Insufficient documentation

## 2014-03-08 DIAGNOSIS — Y9289 Other specified places as the place of occurrence of the external cause: Secondary | ICD-10-CM | POA: Diagnosis not present

## 2014-03-08 DIAGNOSIS — I129 Hypertensive chronic kidney disease with stage 1 through stage 4 chronic kidney disease, or unspecified chronic kidney disease: Secondary | ICD-10-CM | POA: Insufficient documentation

## 2014-03-08 DIAGNOSIS — R079 Chest pain, unspecified: Secondary | ICD-10-CM | POA: Diagnosis present

## 2014-03-08 DIAGNOSIS — R55 Syncope and collapse: Secondary | ICD-10-CM | POA: Insufficient documentation

## 2014-03-08 DIAGNOSIS — Y998 Other external cause status: Secondary | ICD-10-CM | POA: Insufficient documentation

## 2014-03-08 DIAGNOSIS — Z880 Allergy status to penicillin: Secondary | ICD-10-CM | POA: Insufficient documentation

## 2014-03-08 DIAGNOSIS — E785 Hyperlipidemia, unspecified: Secondary | ICD-10-CM | POA: Diagnosis not present

## 2014-03-08 DIAGNOSIS — I10 Essential (primary) hypertension: Secondary | ICD-10-CM | POA: Diagnosis present

## 2014-03-08 DIAGNOSIS — I213 ST elevation (STEMI) myocardial infarction of unspecified site: Secondary | ICD-10-CM | POA: Diagnosis not present

## 2014-03-08 DIAGNOSIS — S0101XA Laceration without foreign body of scalp, initial encounter: Secondary | ICD-10-CM | POA: Insufficient documentation

## 2014-03-08 DIAGNOSIS — M797 Fibromyalgia: Secondary | ICD-10-CM | POA: Insufficient documentation

## 2014-03-08 DIAGNOSIS — M1990 Unspecified osteoarthritis, unspecified site: Secondary | ICD-10-CM | POA: Insufficient documentation

## 2014-03-08 DIAGNOSIS — N189 Chronic kidney disease, unspecified: Secondary | ICD-10-CM | POA: Diagnosis not present

## 2014-03-08 DIAGNOSIS — I200 Unstable angina: Secondary | ICD-10-CM

## 2014-03-08 DIAGNOSIS — E119 Type 2 diabetes mellitus without complications: Secondary | ICD-10-CM | POA: Insufficient documentation

## 2014-03-08 DIAGNOSIS — Z7982 Long term (current) use of aspirin: Secondary | ICD-10-CM | POA: Diagnosis not present

## 2014-03-08 DIAGNOSIS — Y9389 Activity, other specified: Secondary | ICD-10-CM | POA: Insufficient documentation

## 2014-03-08 DIAGNOSIS — E1165 Type 2 diabetes mellitus with hyperglycemia: Secondary | ICD-10-CM | POA: Insufficient documentation

## 2014-03-08 DIAGNOSIS — I429 Cardiomyopathy, unspecified: Secondary | ICD-10-CM | POA: Diagnosis not present

## 2014-03-08 DIAGNOSIS — Z86718 Personal history of other venous thrombosis and embolism: Secondary | ICD-10-CM | POA: Insufficient documentation

## 2014-03-08 DIAGNOSIS — W228XXA Striking against or struck by other objects, initial encounter: Secondary | ICD-10-CM | POA: Insufficient documentation

## 2014-03-08 DIAGNOSIS — Z8701 Personal history of pneumonia (recurrent): Secondary | ICD-10-CM | POA: Diagnosis not present

## 2014-03-08 HISTORY — DX: Cerebral infarction, unspecified: I63.9

## 2014-03-08 HISTORY — DX: Syncope and collapse: R55

## 2014-03-08 HISTORY — DX: Palpitations: R00.2

## 2014-03-08 HISTORY — DX: Takotsubo syndrome: I51.81

## 2014-03-08 HISTORY — DX: Chronic kidney disease, stage 2 (mild): N18.2

## 2014-03-08 HISTORY — DX: Type 2 diabetes mellitus without complications: E11.9

## 2014-03-08 HISTORY — DX: Cardiogenic shock: R57.0

## 2014-03-08 HISTORY — DX: Nonalcoholic steatohepatitis (NASH): K75.81

## 2014-03-08 LAB — TSH: TSH: 3.47 u[IU]/mL (ref 0.350–4.500)

## 2014-03-08 LAB — BASIC METABOLIC PANEL
Anion gap: 13 (ref 5–15)
BUN: 16 mg/dL (ref 6–23)
CALCIUM: 8.2 mg/dL — AB (ref 8.4–10.5)
CO2: 24 mmol/L (ref 19–32)
Chloride: 100 mEq/L (ref 96–112)
Creatinine, Ser: 0.87 mg/dL (ref 0.50–1.10)
GFR calc Af Amer: 79 mL/min — ABNORMAL LOW (ref >90)
GFR, EST NON AFRICAN AMERICAN: 68 mL/min — AB (ref >90)
GLUCOSE: 127 mg/dL — AB (ref 70–99)
Potassium: 4.3 mmol/L (ref 3.5–5.1)
Sodium: 137 mmol/L (ref 135–145)

## 2014-03-08 LAB — TROPONIN I

## 2014-03-08 LAB — GLUCOSE, CAPILLARY
Glucose-Capillary: 152 mg/dL — ABNORMAL HIGH (ref 70–99)
Glucose-Capillary: 160 mg/dL — ABNORMAL HIGH (ref 70–99)

## 2014-03-08 MED ORDER — ENOXAPARIN SODIUM 40 MG/0.4ML ~~LOC~~ SOLN
40.0000 mg | Freq: Every day | SUBCUTANEOUS | Status: DC
  Administered 2014-03-08 – 2014-03-09 (×2): 40 mg via SUBCUTANEOUS
  Filled 2014-03-08 (×2): qty 0.4

## 2014-03-08 MED ORDER — ZOLPIDEM TARTRATE 5 MG PO TABS: 5.0000 mg | ORAL_TABLET | Freq: Every evening | ORAL | Status: DC | PRN

## 2014-03-08 MED ORDER — TRIAMTERENE-HCTZ 37.5-25 MG PO TABS
1.0000 | ORAL_TABLET | Freq: Every day | ORAL | Status: DC
Start: 2014-03-08 — End: 2014-03-09
  Administered 2014-03-08 – 2014-03-09 (×2): 1 via ORAL
  Filled 2014-03-08 (×2): qty 1

## 2014-03-08 MED ORDER — ACETAMINOPHEN 325 MG PO TABS
650.0000 mg | ORAL_TABLET | ORAL | Status: DC | PRN
  Administered 2014-03-08: 650 mg via ORAL
  Filled 2014-03-08: qty 2

## 2014-03-08 MED ORDER — ATORVASTATIN CALCIUM 10 MG PO TABS
10.0000 mg | ORAL_TABLET | Freq: Every day | ORAL | Status: DC
  Administered 2014-03-08 – 2014-03-09 (×2): 10 mg via ORAL
  Filled 2014-03-08 (×2): qty 1

## 2014-03-08 MED ORDER — INSULIN ASPART 100 UNIT/ML ~~LOC~~ SOLN: 0.0000 [IU] | Freq: Every day | SUBCUTANEOUS | Status: DC

## 2014-03-08 MED ORDER — ONDANSETRON HCL 4 MG/2ML IJ SOLN
4.0000 mg | Freq: Four times a day (QID) | INTRAMUSCULAR | Status: DC | PRN
Start: 2014-03-08 — End: 2014-03-09

## 2014-03-08 MED ORDER — VITAMIN D (ERGOCALCIFEROL) 1.25 MG (50000 UNIT) PO CAPS
50000.0000 [IU] | ORAL_CAPSULE | ORAL | Status: DC
  Administered 2014-03-08: 50000 [IU] via ORAL
  Filled 2014-03-08: qty 1

## 2014-03-08 MED ORDER — GLIPIZIDE ER 10 MG PO TB24
10.0000 mg | ORAL_TABLET | Freq: Two times a day (BID) | ORAL | Status: DC
  Administered 2014-03-08 – 2014-03-09 (×3): 10 mg via ORAL
  Filled 2014-03-08 (×4): qty 1

## 2014-03-08 MED ORDER — LOSARTAN POTASSIUM 50 MG PO TABS
100.0000 mg | ORAL_TABLET | Freq: Every day | ORAL | Status: DC
Start: 2014-03-08 — End: 2014-03-09
  Administered 2014-03-08 – 2014-03-09 (×2): 100 mg via ORAL
  Filled 2014-03-08 (×2): qty 2

## 2014-03-08 MED ORDER — LINAGLIPTIN 5 MG PO TABS
5.0000 mg | ORAL_TABLET | Freq: Every day | ORAL | Status: DC
  Administered 2014-03-08 – 2014-03-09 (×2): 5 mg via ORAL
  Filled 2014-03-08 (×2): qty 1

## 2014-03-08 MED ORDER — INSULIN ASPART 100 UNIT/ML ~~LOC~~ SOLN
0.0000 [IU] | Freq: Three times a day (TID) | SUBCUTANEOUS | Status: DC
Start: 2014-03-08 — End: 2014-03-09
  Administered 2014-03-08: 3 [IU] via SUBCUTANEOUS
  Administered 2014-03-09: 5 [IU] via SUBCUTANEOUS
  Administered 2014-03-09: 2 [IU] via SUBCUTANEOUS

## 2014-03-08 MED ORDER — NITROGLYCERIN 2 % TD OINT
0.5000 [in_us] | TOPICAL_OINTMENT | Freq: Four times a day (QID) | TRANSDERMAL | Status: DC
  Administered 2014-03-08 – 2014-03-09 (×5): 0.5 [in_us] via TOPICAL
  Filled 2014-03-08: qty 30
  Filled 2014-03-08: qty 1

## 2014-03-08 MED ORDER — ALPRAZOLAM 0.25 MG PO TABS: 0.2500 mg | ORAL_TABLET | Freq: Two times a day (BID) | ORAL | Status: DC | PRN

## 2014-03-08 MED ORDER — METOPROLOL SUCCINATE ER 100 MG PO TB24
100.0000 mg | ORAL_TABLET | Freq: Every day | ORAL | Status: DC
  Administered 2014-03-08 – 2014-03-09 (×2): 100 mg via ORAL
  Filled 2014-03-08: qty 1
  Filled 2014-03-08: qty 4

## 2014-03-08 MED ORDER — DULOXETINE HCL 60 MG PO CPEP
60.0000 mg | ORAL_CAPSULE | Freq: Every day | ORAL | Status: DC
  Administered 2014-03-08 – 2014-03-09 (×2): 60 mg via ORAL
  Filled 2014-03-08 (×2): qty 1

## 2014-03-08 NOTE — ED Notes (Signed)
Cardiologist in room at this time.

## 2014-03-08 NOTE — ED Notes (Addendum)
Pt came in with [facility] from [facility]. Pt was seen at [facility] this morning due to an unwitnessed syncopal episode syncopal episode this morning. Pt stated that she got up and went to get something to drink and then started having left sided chest pain and became slightly short of breath and then fell and hit the back of her head. Pt has hx MI in December of 2014. Pt stated that this chest pain felt worse than her previous MI. Pt A&O x 4. Upon arrival pt is not in any active pain. Pt was originally supposed to be a direct admit but due to having active pain in route pt was brought to the ED. Pt had CT and XR done at [facility] that revealed no acute abnormal finding.

## 2014-03-08 NOTE — H&P (Signed)
CARDIOLOGY HISTORY AND PHYSICAL   Patient ID: Sarah Flores MRN: [id number] DOB/AGE: [birth year] 69 y.o.  Admit date: 03/08/2014  Primary Physician   Gilford Rile, MD Primary Cardiologist   Dr. Ellyn Hack Reason for Consultation  Syncope and chest pain  [id number] Sarah Flores is a 69 y.o. female with a history of Takotsubo CM, nl cors 02/2013, whose EF had normalized by December 2015.  She has had occasional chest pain since her MI, and had a syncopal episode with possble atrial fibrillation. An event monitor did not show significant arrhythmia.   She has had occasional pressure-type chest pain, not associated with exertion, the worst episode relieved with SL NTG x 1 and rest in about 30 minutes. Intensity up to a 6/10. She has been recently started on Imdur, with improvement in her symptoms.   She was in her usual state of health today, when she had sudden onset of sharp chest pain at 6 am. It was severe, she was unable to breath because of the pain, > 10/10.  She was unable to call for help and woke up on the floor. When she woke up, she was having chest pressure 8/10, and was bleeding from a head laceration. She did not try any medications. There were no associated symptoms.   She went to [facility], had staples in her head  received ASA 81 mg x 4. She was transferred to [facility] because of her history and because she was still having pain, but the pain was beginning to ease off.  In the [facility] ER, she has not received medications and the pressure has gradually decreased to a 1/10. The pressure is similar to her MI pain, but she had no jaw pain today. The sharp pain was new.   Past Medical History  Diagnosis Date  . 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 PONV (postoperative nausea and vomiting)   . Hypertension   . Chronic kidney disease     abnormal labs  . Fibromyalgia   . Family history of anesthesia complication     "son goes crazy;  hits people" (02/18/2013)  . 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 Pneumonia 1950; ~ 1960; ~ 1967  . Chronic bronchitis     "usually get it q other year" (02/18/2013)  . Type II diabetes mellitus   . Arthritis     "all my joints" (02/18/2013)  . 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  STE improved with NTG   . HTN (hypertension) 02/19/2013  . Dyslipidemia, goal LDL below 70 02/19/2013  . Diabetes 02/19/2013  . Cardiomyopathy, postop stress  02/23/2013    TakoTsubo - with transient STEMI; followed by acute HF/cardiogenic shock -- now resolved & EF back to normal    Past Surgical History  Procedure Laterality Date  . Parathyroidectomy  08/07/09    [facility]  . Colonoscopy w/ biopsies    . Laryngoscopy      laryngoscopy micro suspension with left Cymetra injection for dysphonia 09/29/09 [facility])  . Posterior lumbar fusion  02/18/2013  . Tonsillectomy  1950's  . Appendectomy  02/1966  . Cholecystectomy  ~2001  . Tubal ligation  1979  . Plantar's wart excision Bilateral 1960's; 1980  . Cardiac catheterization  1990  X2  . Abdominal hysterectomy  [id number]  . Maximum access (mas)posterior lumbar interbody fusion (plif) 1 level N/A 02/18/2013    Procedure: LUMBAR FIVE TO SACRAL ONE MAXIMUM ACCESS (MAS) POSTERIOR LUMBAR INTERBODY FUSION (PLIF) 1 LEVEL;  Surgeon: Eustace Moore, MD;  Location: [place] NEURO ORS;  Service: Neurosurgery;  Laterality: N/A;  . Cardiac catheterization  02/22/2013    Nonischemic; no notable CAD. EF 40%.  . Transthoracic echocardiogram  02/18/2013    EF 35-30% with anterolateral, lateral and inferolateral/apical hypokinesis (Takotsubo)   . Transthoracic echocardiogram  02/25/2013    EF 55%. No clear regional wall motion abnormalities. Grade 3 diastolic dysfunction. Mild MR  . Left heart  catheterization with coronary angiogram N/A 02/22/2013    Procedure: LEFT HEART CATHETERIZATION WITH CORONARY ANGIOGRAM;  Surgeon: Sanda Klein, MD;  Location: [place] CATH LAB;  Service: Cardiovascular;  Laterality: N/A;    Allergies  Allergen Reactions  . Penicillins Anaphylaxis  . Benzoin     Burning - "looked like a 2nd degree burn"   I have reviewed the patient's current medications Medication Sig  aspirin EC 81 MG tablet Take 324 mg by mouth once.  atorvastatin (LIPITOR) 10 MG tablet Take 10 mg by mouth daily.  DULoxetine (CYMBALTA) 60 MG capsule Take 60 mg by mouth daily.  glipiZIDE (GLUCOTROL XL) 10 MG 24 hr tablet Take 10 mg by mouth 2 (two) times daily.  isosorbide mononitrate (IMDUR) 30 MG 24 hr tablet Take 1 tablet (30 mg total) by mouth daily.  losartan (COZAAR) 100 MG tablet Take 100 mg by mouth daily.  metFORMIN (GLUCOPHAGE) 1000 MG tablet Take 500 mg by mouth 2 (two) times daily with a meal.   metoprolol succinate (TOPROL-XL) 100 MG 24 hr tablet Take 1 tablet (100 mg total) by mouth daily. Take with or immediately following a meal.  nitroGLYCERIN (NITROSTAT) 0.4 MG SL tablet Place 1 tablet (0.4 mg total) under the tongue every 5 (five) minutes as needed for chest pain.  sitaGLIPtin (JANUVIA) 100 MG tablet Take 100 mg by mouth daily.  triamterene-hydrochlorothiazide (MAXZIDE-25) 37.5-25 MG per tablet Take 1 tablet by mouth daily.  Vitamin D, Ergocalciferol, (DRISDOL) 50000 UNITS CAPS capsule Take 50,000 Units by mouth every 7 (seven) days. Wednesdays  HYDROcodone-acetaminophen (NORCO) 7.5-325 MG per tablet Take 1 tablet by mouth every 6 (six) hours as needed (for pain). Patient not taking: Reported on 03/08/2014     History   Social History  . Marital Status: Married    Spouse Name: N/A    Number of Children: N/A  . Years of Education: N/A   Occupational History  . Retired Network engineer    Social History Main Topics  . Smoking status: Never Smoker   . Smokeless tobacco:  Never Used  . Alcohol Use: No  . Drug Use: No  . Sexual Activity: Not Currently   Other Topics Concern  . Not on file   Social History Narrative   Lives with husband in [place], [place]. She is a married mother of 2 with 2 grandchildren. She has been married since 48. She is retired Network engineer having completed high school. She does not drink alcohol never smoked. She has not been exercising recently, but wants to get back to walking on a treadmill about 3 days a week.     Family Status  Relation Status Death Age  . Mother Deceased 70    cancer  . Father Deceased 50    cancer  . Brother Alive   .  Maternal Grandmother Deceased   . Maternal Grandfather Deceased   . Paternal Grandmother Deceased   . Paternal Grandfather Deceased    Family History  Problem Relation Age of Onset  . Cancer Mother 63  . Heart attack Mother 56  . Heart attack Father 3  . Cancer Father 43    Leukemia  . Heart disease Brother   . Heart attack Maternal Grandfather   . Diabetes Paternal Grandmother   . Heart attack Paternal Grandfather      ROS: No recent illnesses, husband had knee surgery and has limited her activity. Back problems are better since her surgery. Full 14 point review of systems complete and found to be negative unless listed above.  Physical Exam: Blood pressure 126/70, pulse 71, temperature 97.7 F (36.5 C), temperature source Oral, resp. rate 16, SpO2 97 %.  General: Well developed, well nourished, female in no acute distress Head: Eyes PERRLA, No xanthomas.   Normocephalic and atraumatic, oropharynx without edema or exudate. Dentition: good Lungs: CTA bilaterally Heart: HRRR S1 S2, no rub/gallop, no murmur. pulses are 2+ all 4 extrem.   Neck: No carotid bruits. No lymphadenopathy.  JVD not elevated. Abdomen: Bowel sounds present, abdomen soft and non-tender without masses or hernias noted. Msk:  No spine or cva tenderness. No weakness, no joint deformities or  effusions. Extremities: No clubbing or cyanosis. No edema.  Neuro: Alert and oriented X 3. No focal deficits noted. Psych:  Good affect, responds appropriately Skin: No rashes or lesions noted.  Labs: other labs pending   Lab Results  Component Value Date   WBC 11.1 03/08/2014   HGB 11.7*    HCT 36.6*    MCV 92    PLT 340         NA 137    K+ 4.6    CL 98    CO2 25    BUN 19    Cr 0.8    GLU 247         INR 1.1    PTT 24.3 LFTs WNL        TROPONIN < 0.01    CPK 48          No results for input(s): CKTOTAL, CKMB, TROPONINI in the last 72 hours.  Echo: 02/16/2014 Conclusions - Left ventricle: The cavity size was normal. Wall thickness was normal. Systolic function was normal. The estimated ejection fraction was in the range of 55% to 60%. Doppler parameters are consistent with abnormal left ventricular relaxation (grade 1 diastolic dysfunction). Doppler parameters are consistent with high ventricular filling pressure.  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 No C-spine fracture or subluxation  CXR No active disease   ASSESSMENT AND PLAN:   The patient was seen today by Dr. Percival Spanish, the patient evaluated and the data reviewed.  Principal Problem:   Unstable angina pectoris - admit to telemetry, r/o MI, add NTG paste and change to IV NTG if does not relieve pain. Continue other home medications. Cycle enzymes. MD to review data and advise on cath vs MV if enzymes remain negative.  Active Problems:   Essential hypertension - good control on current rx    Dyslipidemia, goal LDL below 70 - continue statin, f/u w/ PCP    Diabetes - hold metformin, SSI  Signed: Rosaria Ferries, PA-C 03/08/2014 12:53  PM Beeper [phone]  Co-Sign MD  History and all data above reviewed.  Patient examined.   I agree with the findings as above.  She had a sudden syncopal event with head trauma and a scalp lac.  She did have sharp chest pain.  However, there were no acute EKG changes and no enzyme elevations thus far.  This does not sound similar to her previous presentation for Takatsubo's.  She is now with very mild chest pain.  The patient exam reveals COR:RRR  ,  Lungs: Clear  ,  Abd: Positive bowel sounds, no rebound no guarding, Ext No edema   .  All available labs, radiology testing, previous records reviewed. Agree with documented assessment and plan. Syncope:  Etiology is not clear.  She will need to be observed on telemetry and I would suggest repeating an echocardiogram.  I do not suspect a new cardiomyopathy or acute coronary event however.  Question vagal following acute chest pain.  Cannot exclude arhythmia.    Jeneen Rinks Duell Holdren  1:51 PM  03/08/2014

## 2014-03-08 NOTE — ED Notes (Addendum)
Paged Dr. Vita Barley to evaluate pt. And admit pt. To 3E. Case manager Larena Glassman NP [phone]) paged.

## 2014-03-08 NOTE — ED Provider Notes (Signed)
CSN: [id number]     Arrival date & time 03/08/14  1043 History   None    Chief Complaint  Patient presents with  . Chest Pain     (Consider location/radiation/quality/duration/timing/severity/associated sxs/prior Treatment) HPI Comments: 69 year old female who presents as transfer from [facility] due to chest pain and syncope. She was initially evaluated in the emergency department at [facility] after she developed sudden onset chest pain and shortness of breath and resulting in syncope and collapse. She struck her head and had laceration which was repaired at [facility]. She was transferred to [facility] for admission. In route, there was a problem with her bed, so she was diverted to the emergency department for evaluation and admission.  While in the ED, she had no chest pain.  Patient is a 69 y.o. female presenting with chest pain.  Chest Pain Pain location:  Substernal area Pain quality: pressure and sharp   Pain severity:  Severe Onset quality:  Sudden Duration: about an hour. Timing:  Constant Progression:  Resolved Chronicity:  New Relieved by:  Nothing Associated symptoms: shortness of breath and syncope   Associated symptoms: no fever     Past Medical History  Diagnosis Date  . 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 PONV (postoperative nausea and vomiting)   . Hypertension   . Chronic kidney disease     abnormal labs  . Fibromyalgia   . Family history of anesthesia complication     "son goes crazy; hits people" (02/18/2013)  . 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 Pneumonia 1950; ~ 1960; ~ 1967  . Chronic bronchitis     "usually get it q other year" (02/18/2013)  . Type II diabetes mellitus   . Arthritis     "all my joints" (02/18/2013)  . ST elevation myocardial infarction (STEMI) of lateral wall 02/19/2013   Post-OPerative Stress induced -- no evidence of CAD.  STE improved with NTG   . HTN (hypertension) 02/19/2013  . Dyslipidemia, goal LDL below 70 02/19/2013  . Diabetes 02/19/2013  . Cardiomyopathy, postop stress  02/23/2013    TakoTsubo - with transient STEMI; followed by acute HF/cardiogenic shock -- now resolved & EF back to normal   Past Surgical History  Procedure Laterality Date  . Parathyroidectomy  08/07/09    [facility]  . Colonoscopy w/ biopsies    . Laryngoscopy      laryngoscopy micro suspension with left Cymetra injection for dysphonia 09/29/09 [facility])  . Posterior lumbar fusion  02/18/2013  . Tonsillectomy  1950's  . Appendectomy  02/1966  . Cholecystectomy  ~2001  . Tubal ligation  1979  . Plantar's wart excision Bilateral 1960's; 1980  . Cardiac catheterization  1990 X2  . Abdominal hysterectomy  [id number]  . Maximum access (mas)posterior lumbar interbody fusion (plif) 1 level N/A 02/18/2013    Procedure: LUMBAR FIVE TO SACRAL ONE MAXIMUM ACCESS (MAS) POSTERIOR LUMBAR INTERBODY FUSION (PLIF) 1 LEVEL;  Surgeon: Eustace Moore, MD;  Location: [place] NEURO ORS;  Service: Neurosurgery;  Laterality: N/A;  . Cardiac catheterization  02/22/2013    Nonischemic; no notable CAD. EF 40%.  . Transthoracic echocardiogram  02/18/2013    EF 35-30% with anterolateral, lateral and inferolateral/apical hypokinesis (Takotsubo)   . Transthoracic echocardiogram  02/25/2013    EF 55%. No clear regional wall motion abnormalities.  Grade 3 diastolic dysfunction. Mild MR  . Left heart catheterization with coronary angiogram N/A 02/22/2013    Procedure: LEFT HEART CATHETERIZATION WITH CORONARY ANGIOGRAM;  Surgeon: Sanda Klein, MD;  Location: [place] CATH LAB;  Service: Cardiovascular;  Laterality: N/A;   Family History  Problem Relation Age of Onset  . Cancer Mother 54  . Heart attack Mother 47  . Heart attack Father 75  . Cancer Father 40    Leukemia  . Heart disease Brother   . Heart attack  Maternal Grandfather   . Diabetes Paternal Grandmother   . Heart attack Paternal Grandfather    History  Substance Use Topics  . Smoking status: Never Smoker   . Smokeless tobacco: Never Used  . Alcohol Use: No   OB History    No data available     Review of Systems  Constitutional: Negative for fever.  Respiratory: Positive for shortness of breath.   Cardiovascular: Positive for chest pain and syncope.  All other systems reviewed and are negative.     Allergies  Penicillins and Benzoin  Home Medications   Prior to Admission medications   Medication Sig Start Date End Date Taking? Authorizing Provider  aspirin EC 81 MG tablet Take 324 mg by mouth once.   Yes Historical Provider, MD  atorvastatin (LIPITOR) 10 MG tablet Take 10 mg by mouth daily.   Yes Historical Provider, MD  DULoxetine (CYMBALTA) 60 MG capsule Take 60 mg by mouth daily.   Yes Historical Provider, MD  glipiZIDE (GLUCOTROL XL) 10 MG 24 hr tablet Take 10 mg by mouth 2 (two) times daily.   Yes Historical Provider, MD  isosorbide mononitrate (IMDUR) 30 MG 24 hr tablet Take 1 tablet (30 mg total) by mouth daily. 02/22/14  Yes [name], MD  losartan (COZAAR) 100 MG tablet Take 100 mg by mouth daily.   Yes Historical Provider, MD  metFORMIN (GLUCOPHAGE) 1000 MG tablet Take 500 mg by mouth 2 (two) times daily with a meal.    Yes Historical Provider, MD  metoprolol succinate (TOPROL-XL) 100 MG 24 hr tablet Take 1 tablet (100 mg total) by mouth daily. Take with or immediately following a meal. 09/13/13  Yes [name], MD  nitroGLYCERIN (NITROSTAT) 0.4 MG SL tablet Place 1 tablet (0.4 mg total) under the tongue every 5 (five) minutes as needed for chest pain. 03/25/13  Yes [name], MD  sitaGLIPtin (JANUVIA) 100 MG tablet Take 100 mg by mouth daily.   Yes Historical Provider, MD  triamterene-hydrochlorothiazide (MAXZIDE-25) 37.5-25 MG per tablet Take 1 tablet by mouth daily.   Yes Historical Provider, MD   Vitamin D, Ergocalciferol, (DRISDOL) 50000 UNITS CAPS capsule Take 50,000 Units by mouth every 7 (seven) days. Wednesdays   Yes Historical Provider, MD  HYDROcodone-acetaminophen (NORCO) 7.5-325 MG per tablet Take 1 tablet by mouth every 6 (six) hours as needed (for pain). Patient not taking: Reported on 03/08/2014 02/28/13   Otilio Connors, MD   BP 116/62 mmHg  Pulse 71  Temp(Src) 97.7 F (36.5 C) (Oral)  Resp 15  SpO2 98% Physical Exam  Constitutional: She is oriented to person, place, and time. She appears well-developed and well-nourished. No distress.  HENT:  Head: Normocephalic and atraumatic.  Mouth/Throat: Oropharynx is clear and moist.  Laceration to posterior scalp, repaired with staples  Eyes: Conjunctivae are normal. Pupils are equal, round, and reactive to light. No scleral icterus.  Neck: Neck supple.  Cardiovascular: Normal rate, regular rhythm, normal  heart sounds and intact distal pulses.   No murmur heard. Pulmonary/Chest: Effort normal and breath sounds normal. No stridor. No respiratory distress. She has no rales.  Abdominal: Soft. Bowel sounds are normal. She exhibits no distension. There is no tenderness.  Musculoskeletal: Normal range of motion. She exhibits no edema or tenderness.  Neurological: She is alert and oriented to person, place, and time.  Skin: Skin is warm and dry. No rash noted.  Psychiatric: She has a normal mood and affect. Her behavior is normal.  Nursing note and vitals reviewed.   ED Course  Procedures (including critical care time) Labs Review Labs Reviewed  BASIC METABOLIC PANEL - Abnormal; Notable for the following:    Glucose, Bld 127 (*)    Calcium 8.2 (*)    GFR calc non Af Amer 68 (*)    GFR calc Af Amer 79 (*)    All other components within normal limits  GLUCOSE, CAPILLARY - Abnormal; Notable for the following:    Glucose-Capillary 152 (*)    All other components within normal limits  TROPONIN I  TSH  TROPONIN I  TROPONIN  I  HEMOGLOBIN A1C    Imaging Review No results found.   EKG Interpretation   Date/Time:  Tuesday March 08 2014 10:55:15 EST Ventricular Rate:  70 PR Interval:  164 QRS Duration: 91 QT Interval:  405 QTC Calculation: 437 R Axis:   71 Text Interpretation:  Sinus rhythm Low voltage, precordial leads t wave  changes not as prominent compared to prior Confirmed by [facility]  MD, TREY  ([id number]) on 03/08/2014 1:32:42 PM      MDM   Final diagnoses:  None  Chest pain, syncope  69 year old female with chest pain and syncope. Cardiology to admit.    Houston Siren III, MD 03/08/14 [phone]

## 2014-03-08 NOTE — ED Notes (Signed)
HEART HEALTHY TRAY ORDERED @ 2:39

## 2014-03-08 NOTE — ED Notes (Signed)
Cardiology called and stated that they would come see the patient.

## 2014-03-08 NOTE — ED Notes (Addendum)
Larena Glassman NP (Case Manager) returned call and Dr. Percival Spanish to come and see pt. For admission.

## 2014-03-09 ENCOUNTER — Encounter (HOSPITAL_COMMUNITY): Payer: Self-pay | Admitting: Physician Assistant

## 2014-03-09 DIAGNOSIS — R55 Syncope and collapse: Secondary | ICD-10-CM

## 2014-03-09 DIAGNOSIS — I369 Nonrheumatic tricuspid valve disorder, unspecified: Secondary | ICD-10-CM

## 2014-03-09 LAB — GLUCOSE, CAPILLARY
GLUCOSE-CAPILLARY: 197 mg/dL — AB (ref 70–99)
Glucose-Capillary: 140 mg/dL — ABNORMAL HIGH (ref 70–99)
Glucose-Capillary: 245 mg/dL — ABNORMAL HIGH (ref 70–99)

## 2014-03-09 LAB — HEMOGLOBIN A1C
HEMOGLOBIN A1C: 8.8 % — AB (ref <5.7)
Mean Plasma Glucose: 206 mg/dL — ABNORMAL HIGH (ref <117)

## 2014-03-09 LAB — TROPONIN I

## 2014-03-09 NOTE — Discharge Summary (Signed)
Discharge Summary   Patient ID: Sarah Flores MRN: [id number], DOB/AGE: [birth year] 69 y.o. Admit date: 03/08/2014 D/C date:     03/09/2014  Primary Care Provider: Gilford Rile, MD Primary Cardiologist: Ellyn Hack  Primary Discharge Diagnoses:  1. Syncope and collapse, suspect vasovagal 2. Sharp atypical chest pain, question musculoskeletal vs coronary vasospasm 3. Hx Takotsubo's in 02/2013 with normal cors, normalize EF in 02/2014  -  echo this admission with no evidence of recurrence 4. Uncontrolled DM A1C 8.8  Past Medical History  Diagnosis Date  . 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 Hypertension   . Fibromyalgia   . 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 Dyslipidemia, goal LDL below 70   . Cardiogenic shock   . ST elevation myocardial infarction (STEMI) of lateral wall     a. Post-op STEMI 02/2013 with cardiogenic shock, cath revealing clean cors and LV dysfunction c/w Takasubo Syndrome.  . Takotsubo cardiomyopathy   . CKD (chronic kidney disease), stage II   . NASH (nonalcoholic steatohepatitis)     Dr. Percell Miller in [place]  . Diabetes mellitus   . Arthritis   . Stroke     a. CT head at [facility] 03/2014: No acute intracranial abnormality, probable small old cortical infarct in L posterior parietal region.  . Syncope     a. Reported prior near-syncope in 2015 with suggestion of atrial fibrillation but no documentation to support this. An event monitor 07/2013 did not show significant arrhythmia - mostly NSR 70-100, with occasional S Tachy ~100-115, rare PACs & PVCs - not recorded as symptomatic. b. Syncope 03/2014 felt vasovagal.  . Palpitations     a. event monitor 07/2013 did not show significant arrhythmia - mostly NSR 70-100, with occasional S Tachy ~100-115, rare PACs & PVCs - not recorded as symptomatic.   Hospital Course: Ms. Spalla is a 69 y.o.  female with a history of post-op Takotsubo cardiomyopathy in 02/2013 (following lumbar surgery with cardiogenic shock, normal coronaries) with normalization of EF by December 2015. She also has a history of DM, HTN, fibromyalgia, CKD stage II, NASH, and arthritis. She has had occasional chest pain since her MI, and had a near-syncopal episode last year with suggestion of atrial fibrillation but no documentation to support this. (Per Dr. Allison Quarry prior office note, the patient stated the EMS personnel suggested that she may have been going in and out of A. Fib but there was no EKG to document that). An event monitor 07/2013 did not show significant arrhythmia - mostly NSR 70-100, with occasional S Tachy ~100-115, rare PACs & PVCs - not recorded as symptomatic. She has had occasional pressure-type chest pain, not associated with exertion, the worst episode relieved with SL NTG x 1 and lasting about 30 minutes. She was recently started on Imdur with improvement in her symptoms.   She was in her usual state of health the day of admission, when she had sudden onset of sharp chest pain at 6 am. It was severe at 10/10. She was unable to breathe because of the pain. She was unable to call for help and woke up on the floor. When she woke up, she was having 8/10 chest pressure and was bleeding from a head laceration. She did not try any medications. There were no associated symptoms.    She went to [facility],  had staples placed in her head and received ASA 81 mg x 4. . CT Head at [facility] showed no acute intracranial abnormality, probable small old cortical infarct in L posterior parietal region, no C-spine fracture or subluxation.She was transferred to [facility] because of her history and due to ongoing CP. In the [facility] ER, the pressure gradually decreased to a 1/10 spontaneously. The presentation was dissimilar to prior MI given sharp quality of pain and absence of jaw pain. Per report, at [facility] on 03/08/13, Wbc  11.1, Hgb 11.7, Plt 340. She was transferred to [facility] for further evaluation. EKG showed SR, no acute ischemic changes. Troponins remained negative. Echo showed no significant abnormalities - EF 60-65%, no RWMA, no significant valvular disease. She has not had any arrhythmias on telemetry and has felt fine since. TSH wnl. Dr. Burt Knack suspects atypical discomfort - he recommended to continue Imdur at current dose and use NTG prn chest pain, but he was not convinced this episode was related to coronary vasospasm. Her syncope was felt to be vasovagal in nature as a reaction to the chest discomfort and moment of panic when she was unable to call for help. She was not tachycardic, tachypnic, or hypoxic. Dr. Burt Knack has seen and examined the patient today and feels she is stable for discharge. We have asked her not to drive until cleared at her follow-up appointment. Dr. Burt Knack does not feel strongly about the recommendation to abstain for 6 months given the vasovagal nature of the event but will defer to how she is doing at follow-up.   She was advised to contact the [facility] ER or her PCP tomorrow to discuss timing of staple removal. I have left a message on our office's after-hours scheduling voicemail requesting a follow-up appointment, and our office will call the patient with this information. She was asked to f/u PCP also for her uncontrolled DM, A1C 8.8.  Discharge Vitals: Blood pressure 114/64, pulse 66, temperature 97.6 F (36.4 C), temperature source Oral, resp. rate 15, height 5\' 2"  (1.575 m), weight 149 lb 9.6 oz (67.858 kg), SpO2 96 %.  Labs:   Recent Labs Lab 03/08/14 1231  NA 137  K 4.3  CL 100  CO2 24  BUN 16  CREATININE 0.87  CALCIUM 8.2*  GLUCOSE 127*   Per report, at [place] on 03/08/13, Wbc 11.1, Hgn 11.7, Plt 340  Recent Labs  03/07/14 2349 03/08/14 1231 03/08/14 1818  TROPONINI <0.03 <0.03 <0.03   A1C 8.8 TSH 3.470  Diagnostic Studies/Procedures   See  above.  Discharge Medications   Current Discharge Medication List    CONTINUE these medications which have NOT CHANGED   Details  atorvastatin (LIPITOR) 10 MG tablet Take 10 mg by mouth daily.    DULoxetine (CYMBALTA) 60 MG capsule Take 60 mg by mouth daily.    glipiZIDE (GLUCOTROL XL) 10 MG 24 hr tablet Take 10 mg by mouth 2 (two) times daily.    isosorbide mononitrate (IMDUR) 30 MG 24 hr tablet Take 1 tablet (30 mg total) by mouth daily.    Associated Diagnoses: Essential hypertension; Coronary artery spasm    losartan (COZAAR) 100 MG tablet Take 100 mg by mouth daily.    metFORMIN (GLUCOPHAGE) 1000 MG tablet Take 500 mg by mouth 2 (two) times daily with a meal.     metoprolol succinate (TOPROL-XL) 100 MG 24 hr tablet Take 1 tablet (100 mg total) by mouth daily. Take with or immediately following a meal.    Associated Diagnoses:  Palpitations; Takotsubo cardiomyopathy    nitroGLYCERIN (NITROSTAT) 0.4 MG SL tablet Place 1 tablet (0.4 mg total) under the tongue every 5 (five) minutes as needed for chest pain.    Associated Diagnoses: ST elevation myocardial infarction (STEMI) of lateral wall    sitaGLIPtin (JANUVIA) 100 MG tablet Take 100 mg by mouth daily.    triamterene-hydrochlorothiazide (MAXZIDE-25) 37.5-25 MG per tablet Take 1 tablet by mouth daily.    Vitamin D, Ergocalciferol, (DRISDOL) 50000 UNITS CAPS capsule Take 50,000 Units by mouth every 7 (seven) days. Wednesdays      STOP taking these medications     aspirin EC 81 MG tablet Take 324mg  ONCE      HYDROcodone-acetaminophen (NORCO) 7.5-325 MG per tablet - completed course         Disposition   The patient will be discharged in stable condition to home. Discharge Instructions    Diet - low sodium heart healthy    Complete by:  As directed      Increase activity slowly    Complete by:  As directed   Do not drive until cleared at your follow-up appointment.          Follow-up Information    Follow up  with Gilford Rile, MD.   Specialty:  Internal Medicine   Why:  Call your doctor or the [facility] ER to discuss the timing of staple removal from your injury. You also need follow-up since your diabetes was uncontrolled with A1C of 8.8.   Contact information:   [place] [address] [phone]       Follow up with [name], MD.   Specialty:  Cardiology   Why:  Office will call you for your followup appointment. Call office if you have not heard back in 3 days.   Contact information:   [place] [address] [phone]         Duration of Discharge Encounter: Greater than 30 minutes including physician and PA time.  Signed, Melina Copa PA-C 03/09/2014, 7:35 PM

## 2014-03-09 NOTE — Progress Notes (Signed)
  Echocardiogram 2D Echocardiogram has been performed.  Sarah Flores 03/09/2014, 10:48 AM

## 2014-03-09 NOTE — Progress Notes (Signed)
    Subjective:  No recurrence of chest pain or lightheadedness. Has been ambulatory today without shortness of breath.  Objective:  Vital Signs in the last 24 hours: Temp:  [97.6 F (36.4 C)-98.6 F (37 C)] 97.6 F (36.4 C) (01/06 1634) Pulse Rate:  [61-70] 66 (01/06 1634) Resp:  [15-18] 15 (01/06 1634) BP: (104-122)/(50-72) 114/64 mmHg (01/06 1634) SpO2:  [96 %-99 %] 96 % (01/06 1634) Weight:  [149 lb 9.6 oz (67.858 kg)] 149 lb 9.6 oz (67.858 kg) (01/06 0402)  Intake/Output from previous day: 01/05 0701 - 01/06 0700 In: 200 [P.O.:200] Out: -   Physical Exam: Pt is alert and oriented, NAD HEENT: normal Neck: JVP - normal, carotids 2+= without bruits Lungs: CTA bilaterally CV: RRR without murmur or gallop Abd: soft, NT, Positive BS, no hepatomegaly Ext: no C/C/E, distal pulses intact and equal Skin: warm/dry no rash   Lab Results: No results for input(s): WBC, HGB, PLT in the last 72 hours.  Recent Labs  03/08/14 1231  NA 137  K 4.3  CL 100  CO2 24  GLUCOSE 127*  BUN 16  CREATININE 0.87    Recent Labs  03/08/14 1231 03/08/14 1818  TROPONINI <0.03 <0.03    Cardiac Studies: 2D Echo: Study Conclusions  - Left ventricle: The cavity size was normal. Systolic function was normal. The estimated ejection fraction was in the range of 60% to 65%. Wall motion was normal; there were no regional wall motion abnormalities.  Tele: Personally reviewed. Sinus rhythm without arrhythmia.  Assessment/Plan:  1. Syncope and collapse: after chest pain episode with sharp atypical CP now resolved. 2. Hx Takotsubo's with no evidence of recurrence 3. Possible coronary spasm  Reviewed tele, 2D Echo, and lab data. No significant abnormalities noted. Normal LV/RV function without valvular disease. No arrhythmia. Enzymes normal. I think stable for discharge home. Advised it's ok to continue Imdur at current dose and use NTG prn chest pain, but not convinced this episode  was related to coronary vasospasm.  Will arrange follow-up with Dr Ellyn Hack or his PA/NP.   Sherren Mocha, M.D. 03/09/2014, 6:40 PM

## 2014-03-09 NOTE — Progress Notes (Signed)
UR completed 

## 2014-03-09 NOTE — Progress Notes (Signed)
Discharge instructions reviewed with patient and patient's spouse.  RN and patient signed both copies of discharge instructions; one copy placed in patient's chart and second copy sent with patient.  All questions answered at the time of discharge.  Peripheral IV and telemetry monitor removed per day shift RN.  Writing RN wheeled patient to front entrance via wheelchair and assisted patient into private car driven by patient's husband.  All patient belongings taken with patient at time of discharge.

## 2014-03-16 ENCOUNTER — Telehealth: Payer: Self-pay | Admitting: Physician Assistant

## 2014-03-16 NOTE — Telephone Encounter (Signed)
Patient advised about the Asa 81 mg daily. States she is already doing this, and has been since her MI last year. Will add it to her Med list.

## 2014-03-16 NOTE — Telephone Encounter (Signed)
Please call the patient with a new medication recommendation.  She was recently admitted to the hospital for chest pain/syncope and had normal cardiac testing. Her head CT happened to mention an old stroke in the left posterior parietal region. She was not on regularly scheduled aspirin prior to that hospitalization. I discussed with Dr. Ellyn Hack to find out if she should start taking one given the evidence of the old stroke. He agrees it is appropriate. Please ask patient to start Aspirin 81mg  daily. Thanks.  Layson Bertsch PA-C

## 2014-03-16 NOTE — Addendum Note (Signed)
Addended by: Brett Canales on: 03/16/2014 04:38 PM   Modules accepted: Medications

## 2014-03-30 ENCOUNTER — Ambulatory Visit (INDEPENDENT_AMBULATORY_CARE_PROVIDER_SITE_OTHER): Payer: Medicare Other | Admitting: Physician Assistant

## 2014-03-30 ENCOUNTER — Encounter: Payer: Self-pay | Admitting: Physician Assistant

## 2014-03-30 VITALS — BP 112/62 | HR 74 | Ht 63.0 in | Wt 150.4 lb

## 2014-03-30 DIAGNOSIS — R002 Palpitations: Secondary | ICD-10-CM

## 2014-03-30 DIAGNOSIS — I10 Essential (primary) hypertension: Secondary | ICD-10-CM

## 2014-03-30 DIAGNOSIS — I5181 Takotsubo syndrome: Secondary | ICD-10-CM

## 2014-03-30 DIAGNOSIS — E785 Hyperlipidemia, unspecified: Secondary | ICD-10-CM

## 2014-03-30 DIAGNOSIS — R55 Syncope and collapse: Secondary | ICD-10-CM

## 2014-03-30 DIAGNOSIS — R0789 Other chest pain: Secondary | ICD-10-CM

## 2014-03-30 NOTE — Patient Instructions (Signed)
INCREASE your Aspirin to 325 mg daily  Follow up with Dr.Harding as directed

## 2014-03-30 NOTE — Assessment & Plan Note (Signed)
She has a history of undocumented PAF and during this recent admission, she had a head CT showing probable old infarct.  She remembers having symptoms of numbness in her face during that time and not feeling right.  Event monitor last year showed no afib.  Will increase her aspirin to 325 mg daily with the presumption that she's had issues of A. fib in the past.

## 2014-03-30 NOTE — Assessment & Plan Note (Signed)
No recurrent symptoms. I Think it is okay for patient to start driving again

## 2014-03-30 NOTE — Assessment & Plan Note (Signed)
BP controlled. No changes.  

## 2014-03-30 NOTE — Assessment & Plan Note (Signed)
normal left ventricular morphology and normal echocardiogram during this recent hospitalization

## 2014-03-30 NOTE — Assessment & Plan Note (Signed)
She's had no chest pain since her discharge. No presyncope or syncope events

## 2014-03-30 NOTE — Progress Notes (Signed)
Patient ID: Sarah Flores, female   DOB: 10/01/1945, 69 y.o.   MRN: [id number]    Date:  03/30/2014   ID:  Sarah Flores, DOB 15-Sep-1945, MRN [id number]  PCP:  Gilford Rile, MD  Primary Cardiologist:  Ellyn Hack  Chief Complaint  Patient presents with  . Follow-up    post hosp 03/08/14, no chest pain/syncope eps. since     History of Present Illness: Sarah Flores is a 69 y.o. female  with a history of post-op Takotsubo cardiomyopathy in 02/2013 (following lumbar surgery with cardiogenic shock, normal coronaries) with normalization of EF by December 2015. She also has a history of DM, HTN, fibromyalgia, CKD stage II, NASH, and arthritis. She has had occasional chest pain since her MI, and had a near-syncopal episode last year with suggestion of atrial fibrillation but no documentation to support this. (Per Dr. Allison Quarry prior office note, the patient stated the EMS personnel suggested that she may have been going in and out of A. Fib but there was no EKG to document that). An event monitor 07/2013 did not show significant arrhythmia - mostly NSR 70-100, with occasional S Tachy ~100-115, rare PACs & PVCs - not recorded as symptomatic. She has had occasional pressure-type chest pain, not associated with exertion, the worst episode relieved with SL NTG x 1 and lasting about 30 minutes. She was recently started on Imdur with improvement in her symptoms.   She went to [facility], had staples placed in her head and received ASA 81 mg x 4. . CT Head at [facility] showed no acute intracranial abnormality, probable small old cortical infarct in L posterior parietal region, no C-spine fracture or subluxation.She was transferred to [facility] because of her history and due to ongoing CP. In the [facility] ER, the pressure gradually decreased to a 1/10 spontaneously. The presentation was dissimilar to prior MI given sharp quality of pain and absence of jaw pain. Per report, at [facility] on 03/08/13, Wbc 11.1, Hgb 11.7, Plt  340. She was transferred to [facility] for further evaluation. EKG showed SR, no acute ischemic changes. Troponins remained negative. Echo showed no significant abnormalities - EF 60-65%, no RWMA, no significant valvular disease. She has not had any arrhythmias on telemetry and has felt fine since. TSH wnl. Dr. Burt Knack suspects atypical discomfort - he recommended to continue Imdur at current dose and use NTG prn chest pain, but he was not convinced this episode was related to coronary vasospasm. Her syncope was felt to be vasovagal in nature as a reaction to the chest discomfort and moment of panic when she was unable to call for help. She was not tachycardic, tachypnic, or hypoxic.   She presents today for posthospital follow-up. She reports no further symptoms. She did have quite a bit to say regarding the fact that we did not have her on the census at the hospital he forgot to see her the day after she was admitted.  She continues to have a right-sided rib pain which is worse after she coughs. This is a result of her rib fracture after falling. She currently denies nausea, vomiting, fever, chest pain, shortness of breath, orthopnea, dizziness, PND, cough, congestion, abdominal pain, hematochezia, melena, lower extremity edema, claudication.  Wt Readings from Last 3 Encounters:  03/30/14 150 lb 6.4 oz (68.221 kg)  03/09/14 149 lb 9.6 oz (67.858 kg)  02/22/14 153 lb 3.2 oz (69.491 kg)     Past Medical History  Diagnosis Date  . 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 Hypertension   . Fibromyalgia   . 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 Dyslipidemia, goal LDL below 70   . Cardiogenic shock   . ST elevation myocardial infarction (STEMI) of lateral wall     a. Post-op STEMI 02/2013 with cardiogenic shock, cath revealing clean cors and LV dysfunction c/w Takasubo Syndrome.   . Takotsubo cardiomyopathy   . CKD (chronic kidney disease), stage II   . NASH (nonalcoholic steatohepatitis)     Dr. Percell Miller in [place]  . Diabetes mellitus   . Arthritis   . Stroke     a. CT head at [facility] 03/2014: No acute intracranial abnormality, probable small old cortical infarct in L posterior parietal region.  . Syncope     a. Reported prior near-syncope in 2015 with suggestion of atrial fibrillation but no documentation to support this. An event monitor 07/2013 did not show significant arrhythmia - mostly NSR 70-100, with occasional S Tachy ~100-115, rare PACs & PVCs - not recorded as symptomatic. b. Syncope 03/2014 felt vasovagal.  . Palpitations     a. event monitor 07/2013 did not show significant arrhythmia - mostly NSR 70-100, with occasional S Tachy ~100-115, rare PACs & PVCs - not recorded as symptomatic.    Current Outpatient Prescriptions  Medication Sig Dispense Refill  . aspirin 325 MG tablet Take 325 mg by mouth daily.    Marland Kitchen atorvastatin (LIPITOR) 10 MG tablet Take 10 mg by mouth daily.    . DULoxetine (CYMBALTA) 60 MG capsule Take 60 mg by mouth daily.    Marland Kitchen glipiZIDE (GLUCOTROL XL) 10 MG 24 hr tablet Take 10 mg by mouth 2 (two) times daily.    . isosorbide mononitrate (IMDUR) 30 MG 24 hr tablet Take 1 tablet (30 mg total) by mouth daily. 90 tablet 3  . losartan (COZAAR) 100 MG tablet Take 100 mg by mouth daily.    . metFORMIN (GLUCOPHAGE) 1000 MG tablet Take 500 mg by mouth 2 (two) times daily with a meal.     . metoprolol succinate (TOPROL-XL) 100 MG 24 hr tablet Take 1 tablet (100 mg total) by mouth daily. Take with or immediately following a meal. 90 tablet 3  . nitroGLYCERIN (NITROSTAT) 0.4 MG SL tablet Place 1 tablet (0.4 mg total) under the tongue every 5 (five) minutes as needed for chest pain. 25 tablet 6  . sitaGLIPtin (JANUVIA) 100 MG tablet Take 100 mg by mouth daily.    Marland Kitchen triamterene-hydrochlorothiazide (MAXZIDE-25) 37.5-25 MG per tablet Take 1 tablet by  mouth daily.    . Vitamin D, Ergocalciferol, (DRISDOL) 50000 UNITS CAPS capsule Take 50,000 Units by mouth every 7 (seven) days. Wednesdays     No current facility-administered medications for this visit.    Allergies:    Allergies  Allergen Reactions  . Penicillins Anaphylaxis  . Benzoin     Burning - "looked like a 2nd degree burn"    Social History:  The patient  reports that she has never smoked. She has never used smokeless tobacco. She reports that she does not drink alcohol or use illicit drugs.   Family history:   Family History  Problem Relation Age of Onset  . Cancer Mother 87  . Heart attack Mother 57  . Heart attack Father 59  . Cancer Father 48    Leukemia  . Heart  disease Brother   . Heart attack Maternal Grandfather   . Diabetes Paternal Grandmother   . Heart attack Paternal Grandfather     ROS:  Please see the history of present illness.  All other systems reviewed and negative.   PHYSICAL EXAM: VS:  BP 112/62 mmHg  Pulse 74  Ht 5\' 3"  (1.6 m)  Wt 150 lb 6.4 oz (68.221 kg)  BMI 26.65 kg/m2 Well nourished, well developed, in no acute distress HEENT: Pupils are equal round react to light accommodation extraocular movements are intact.  Neck: no JVDNo cervical lymphadenopathy. Cardiac: Regular rate and rhythm without murmurs rubs or gallops. MS: Very tender in the last rib on the right side. No ecchymosis Lungs:  clear to auscultation bilaterally, no wheezing, rhonchi or rales Abd: soft, nontender, positive bowel sounds all quadrants, no hepatosplenomegaly Ext: no lower extremity edema.  2+ radial and dorsalis pedis pulses. Skin: warm and dry Neuro:  Grossly normal     ASSESSMENT AND PLAN:  Problem List Items Addressed This Visit    Takotsubo cardiomyopathy -- postop stress-induced: Resolved (Chronic)    normal left ventricular morphology and normal echocardiogram during this recent hospitalization      Relevant Medications   aspirin 325 MG  tablet   Syncope    No recurrent symptoms. I Think it is okay for patient to start driving again      Relevant Medications   aspirin 325 MG tablet   Palpitations - Primary    She has a history of undocumented PAF and during this recent admission, she had a head CT showing probable old infarct.  She remembers having symptoms of numbness in her face during that time and not feeling right.  Event monitor last year showed no afib.  Will increase her aspirin to 325 mg daily with the presumption that she's had issues of A. fib in the past.      Essential hypertension (Chronic)    BP controlled.  No changes      Relevant Medications   aspirin 325 MG tablet   Dyslipidemia, goal LDL below 70 (Chronic)    Continue statin      Relevant Medications   aspirin 325 MG tablet   Atypical chest pain    She's had no chest pain since her discharge. No presyncope or syncope events        Follow-up in April with Dr. Ellyn Hack

## 2014-03-30 NOTE — Assessment & Plan Note (Signed)
Continue statin. 

## 2014-07-26 ENCOUNTER — Telehealth: Payer: Self-pay | Admitting: Cardiology

## 2014-07-27 NOTE — Telephone Encounter (Signed)
Close encounter 

## 2014-08-29 ENCOUNTER — Other Ambulatory Visit: Payer: Self-pay

## 2014-09-14 ENCOUNTER — Ambulatory Visit (INDEPENDENT_AMBULATORY_CARE_PROVIDER_SITE_OTHER): Payer: Medicare Other | Admitting: Cardiology

## 2014-09-14 ENCOUNTER — Encounter: Payer: Self-pay | Admitting: Cardiology

## 2014-09-14 VITALS — BP 130/70 | HR 66 | Ht 63.0 in | Wt 152.4 lb

## 2014-09-14 DIAGNOSIS — I5181 Takotsubo syndrome: Secondary | ICD-10-CM

## 2014-09-14 DIAGNOSIS — I201 Angina pectoris with documented spasm: Secondary | ICD-10-CM | POA: Diagnosis not present

## 2014-09-14 DIAGNOSIS — E785 Hyperlipidemia, unspecified: Secondary | ICD-10-CM

## 2014-09-14 DIAGNOSIS — I429 Cardiomyopathy, unspecified: Secondary | ICD-10-CM

## 2014-09-14 DIAGNOSIS — Z8673 Personal history of transient ischemic attack (TIA), and cerebral infarction without residual deficits: Secondary | ICD-10-CM | POA: Diagnosis not present

## 2014-09-14 DIAGNOSIS — I10 Essential (primary) hypertension: Secondary | ICD-10-CM

## 2014-09-14 DIAGNOSIS — R002 Palpitations: Secondary | ICD-10-CM

## 2014-09-14 DIAGNOSIS — R55 Syncope and collapse: Secondary | ICD-10-CM

## 2014-09-14 NOTE — Patient Instructions (Signed)
Your physician has requested that you have a carotid duplex. This test is an ultrasound of the carotid arteries in your neck. It looks at blood flow through these arteries that supply the brain with blood. Allow one hour for this exam. There are no restrictions or special instructions.  TRY TO TAKE SOME DEEP BREATHE  WHEN YOU AR HAVING SOME PALPATION TO SEE IF CAN QUIET THEM DOWN .  No change with current medications.   Your physician wants you to follow-up in 6 month DR HARDING - 30 MIN APPOINTMENT.  You will receive a reminder letter in the mail two months in advance. If you don't receive a letter, please call our office to schedule the follow-up appointment.  HAVE A GREAT TRIP TO [place] IN SEPT!!!!!!!

## 2014-09-14 NOTE — Progress Notes (Signed)
PCP: Gilford Rile, MD  Clinic Note: Chief Complaint  Patient presents with  . Follow-up     27month followup; Dr Drisso-did labs no chest pain, no shortness of breath, occassional edema, no pain in legs, occassional cramping in legs, occassional lightheadedness, occassional dizziness  . Palpitations    Potentially related to anxiety    HPI: Sarah Flores is a 69 y.o. female with a PMH (postop Takotsubo cardiomyopathy in December 2014 following back surgery., Acute by cardiac shot. Normal coronaries by cath. EF back to normal by echo in December 2015) below who presents today for six-month follow-up. There is been a suspicion of possible A. fib, but in so far not confirmed. Nothing shown on event monitor. Has intermittent chest discomfort episodes as well as palpitation spells. Occasional orthopnea.  Sarah Flores was last seen on January 27 by Tarri Fuller, PA for hospital follow-up. Apparently on 03/08/2014 she presented to [facility] following a syncopal episode with fall. She had a laceration on her head requiring staples. Apparently she was standing in the kitchen and blacked out shortly after a severe stabbing chest pain that lasted a few minutes. Apparently when she fell, she hit her head. She was transferred to [facility] emergency room due to chest pain. She ruled out for MI and had an echocardiogram showing normal EF and no regional wall motion abnormalities. No arrhythmias noted. The final thought was that this was a vagal episode in response to a sharp chest pain episode. She was discharged without any really clear diagnosis from her perspective. She had a CT scan done at [facility] which showed no acute abnormality but a possible small cortical infarct in the left posterior parietal region.  Recent Hospitalizations: January as noted above.  For post hospital visit with Aaron Edelman, she did not report any further symptoms. Was more concerned about the fact that she was "not added  to the census in the hospital ". She did continue to complain of right-sided chest and rib pain worse with cough. Thought to be the result of a rib fracture during her fall. Otherwise no active cardiac symptoms at that time.  Studies Reviewed: Echo 03/09/2014 - normal LV size and function with EF of 60-65%. No RWM/A  Interval History: Since her last visit, she still has episodes of palpitations and lightheadedness, that often occur when she is resting at night or prior to going to sleep. She almost describes symptoms that would suggest PND, but no orthopnea. She has a sense of almost impending doom when short episodes of palpitations occur, described with also having some discomfort in her chest but not sharp pain like she had artery syncopal event in January. No recurrent syncopal events but has had some dizziness and lightheadedness. Occasional rare edema and cramping.  No exertional chest tightness /pressure or dyspnea. No real near-syncope or syncope. No TIA or amaurosis fugax.  Past Medical History  Diagnosis Date  . 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 Hypertension   . Fibromyalgia   . 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 Dyslipidemia, goal LDL below 70   . Cardiogenic shock   . ST elevation myocardial infarction (STEMI) of lateral wall     a. Post-op STEMI 02/2013 with cardiogenic shock, cath revealing clean cors and LV dysfunction c/w Takasubo Syndrome.  . Takotsubo cardiomyopathy   .  CKD (chronic kidney disease), stage II   . NASH (nonalcoholic steatohepatitis)     Dr. Percell Miller in [place]  . Diabetes mellitus   . Arthritis   . Stroke     a. CT head at [facility] 03/2014: No acute intracranial abnormality, probable small old cortical infarct in L posterior parietal region.  . Syncope     a. Reported prior near-syncope in 2015 with suggestion of  atrial fibrillation but no documentation to support this. An event monitor 07/2013 did not show significant arrhythmia - mostly NSR 70-100, with occasional S Tachy ~100-115, rare PACs & PVCs - not recorded as symptomatic. b. Syncope 03/2014 felt vasovagal.  . Palpitations     a. event monitor 07/2013 did not show significant arrhythmia - mostly NSR 70-100, with occasional S Tachy ~100-115, rare PACs & PVCs - not recorded as symptomatic.    Past Surgical History  Procedure Laterality Date  . Parathyroidectomy  08/07/2009    [facility]  . Colonoscopy w/ biopsies    . Laryngoscopy      laryngoscopy micro suspension with left Cymetra injection for dysphonia 09/29/09 [facility])  . Tonsillectomy  1950's  . Appendectomy  02/1966  . Cholecystectomy  ~2001  . Tubal ligation  1979  . Plantar's wart excision Bilateral 1960's; 1980  . Cardiac catheterization  1990 X2  . Abdominal hysterectomy  [id number]  . Maximum access (mas)posterior lumbar interbody fusion (plif) 1 level N/A 02/18/2013    Procedure: LUMBAR FIVE TO SACRAL ONE MAXIMUM ACCESS (MAS) POSTERIOR LUMBAR INTERBODY FUSION (PLIF) 1 LEVEL;  Surgeon: Eustace Moore, MD;  Location: [facility]URO ORS;  Service: Neurosurgery;  Laterality: N/A;  . Cardiac catheterization  02/22/2013    Nonischemic; no notable CAD. EF 40%.  . Transthoracic echocardiogram  02/18/2013    EF 35-30% with anterolateral, lateral and inferolateral/apical hypokinesis (Takotsubo)   . Transthoracic echocardiogram  02/25/2013    EF 55%. No clear regional wall motion abnormalities. Grade 3 diastolic dysfunction. Mild MR  . Left heart catheterization with coronary angiogram N/A 02/22/2013    Procedure: LEFT HEART CATHETERIZATION WITH CORONARY ANGIOGRAM;  Surgeon: Sanda Klein, MD;  Location: [place] CATH LAB;  Service: Cardiovascular; angiographically normal coronary arteries. LV dysfunction consistent with Takotsubo Cardiomyopathy/Syndrome   . Back surgery    . Transthoracic echocardiogram   December 2015; January 2016    a. Normal LV size and function. EF 55-60%. Gr1 DD;; b. Normal LV size function with EF 60-65%. No RWM/A no comment on valve lesions or abnormalities. No comment on diastolic function.    ROS: A comprehensive was performed. Review of Systems  Constitutional: Negative for weight loss and malaise/fatigue.  HENT: Positive for nosebleeds.   Eyes: Negative for blurred vision.  Respiratory: Negative for cough.   Cardiovascular: Negative for claudication.       Per history of present illness  Gastrointestinal: Negative for heartburn, blood in stool and melena.  Genitourinary: Negative for dysuria and hematuria.  Musculoskeletal: Positive for myalgias (Occasional cramps).  Neurological: Positive for dizziness. Negative for loss of consciousness.       See above. No further syncope  Psychiatric/Behavioral: The patient is nervous/anxious.     Current Outpatient Prescriptions on File Prior to Visit  Medication Sig Dispense Refill  . aspirin 325 MG tablet Take 325 mg by mouth daily.    Marland Kitchen atorvastatin (LIPITOR) 10 MG tablet Take 10 mg by mouth daily.    . DULoxetine (CYMBALTA) 60 MG capsule Take 60 mg by mouth daily.    Marland Kitchen  glipiZIDE (GLUCOTROL XL) 10 MG 24 hr tablet Take 10 mg by mouth 2 (two) times daily.    . isosorbide mononitrate (IMDUR) 30 MG 24 hr tablet Take 1 tablet (30 mg total) by mouth daily. 90 tablet 3  . losartan (COZAAR) 100 MG tablet Take 100 mg by mouth daily.    . metFORMIN (GLUCOPHAGE) 1000 MG tablet Take 500 mg by mouth 2 (two) times daily with a meal.     . metoprolol succinate (TOPROL-XL) 100 MG 24 hr tablet Take 1 tablet (100 mg total) by mouth daily. Take with or immediately following a meal. 90 tablet 3  . nitroGLYCERIN (NITROSTAT) 0.4 MG SL tablet Place 1 tablet (0.4 mg total) under the tongue every 5 (five) minutes as needed for chest pain. 25 tablet 6  . sitaGLIPtin (JANUVIA) 100 MG tablet Take 100 mg by mouth daily.    Marland Kitchen  triamterene-hydrochlorothiazide (MAXZIDE-25) 37.5-25 MG per tablet Take 1 tablet by mouth daily.    . Vitamin D, Ergocalciferol, (DRISDOL) 50000 UNITS CAPS capsule Take 50,000 Units by mouth every 7 (seven) days. Wednesdays     No current facility-administered medications on file prior to visit.   Allergies  Allergen Reactions  . Penicillins Anaphylaxis  . Benzoin     Burning - "looked like a 2nd degree burn"    History   Social History  . Marital Status: Married    Spouse Name: N/A  . Number of Children: N/A  . Years of Education: N/A   Occupational History  . Retired Network engineer    Social History Main Topics  . Smoking status: Never Smoker   . Smokeless tobacco: Never Used  . Alcohol Use: No  . Drug Use: No  . Sexual Activity: Not Currently   Other Topics Concern  . Not on file   Social History Narrative   Lives with husband in [place], [place]. She is a married mother of 2 with 2 grandchildren. She has been married since 85. She is retired Network engineer having completed high school. She does not drink alcohol never smoked. She has not been exercising recently, but wants to get back to walking on a treadmill about 3 days a week.    Family History  Problem Relation Age of Onset  . Cancer Mother 26  . Heart attack Mother 106  . Heart attack Father 29  . Cancer Father 78    Leukemia  . Heart disease Brother   . Heart attack Maternal Grandfather   . Diabetes Paternal Grandmother   . Heart attack Paternal Grandfather     Wt Readings from Last 3 Encounters:  09/14/14 69.128 kg (152 lb 6.4 oz)  03/30/14 68.221 kg (150 lb 6.4 oz)  03/09/14 67.858 kg (149 lb 9.6 oz)   PHYSICAL EXAM BP 130/70 mmHg  Pulse 66  Ht 5\' 3"  (1.6 m)  Wt 69.128 kg (152 lb 6.4 oz)  BMI 27.00 kg/m2 General appearance: alert, cooperative, appears stated age, no distress and Well-nourished and well-groomed. HEENT: [place]/AT, EOMI, MMM, anicteric sclera Neck: no adenopathy, no carotid bruit, no JVD,  supple, symmetrical, trachea midline and  Lungs: CTAB, normal percussion bilaterally and Nonlabored, good air movement  Heart: RRR, S1, S2 normal and Soft 1/6 HSM at apex. Otherwise no M./R./G.  Abdomen: soft, non-tender; bowel sounds normal; no masses, no organomegaly  Extremities: extremities normal, atraumatic, no cyanosis or edema  Pulses: 2+ and symmetric  Neurologic: Grossly normal; somewhat anxious affect but normal mood    Adult ECG  Report  Rate: 66 ;  Rhythm: normal sinus rhythm and Essentially normal EKG with normal axis, intervals and durations. Normal voltage.  Narrative Interpretation: Normal/stable EKG   Other studies Reviewed: Additional studies/ records that were reviewed today include:  Recent Labs:   No new labs  ASSESSMENT / PLAN: Problem List Items Addressed This Visit    Coronary artery spasm (Chronic)    She still has short intermittent episodes of chest discomfort they're similar to what she had with her Takotsubo. Will start low-dose Imdur for possible microvascular ischemia versus spasm.      Dyslipidemia, goal LDL below 70 (Chronic)    On statin. Labs are followed by PCP.      Relevant Orders   EKG 12-Lead (Completed)   VAS US CAROTID   Essential hypertension - Primary (Chronic)    Well-controlled. I don't think her syncopal episode was related to orthostatic symptoms. Continue beta blocker and ARB. Also on triamterene. We need to monitor for dehydration as another potential etiology for her syncope.      Relevant Orders   EKG 12-Lead (Completed)   VAS US CAROTID   History of stroke    Suggested by CT scan. No residual effects. Likely a chronic finding. In light of this finding and her syncopal episode, we will check carotid Dopplers to ensure there is no significant carotid stenoses.      Relevant Orders   EKG 12-Lead (Completed)   VAS US CAROTID   Palpitations    Still no documented arrhythmias. Most these episodes occur at night and  may happen a couple times a week. I talked her about potentially wearing a monitor again. She was not overly excited. Since there was a potential of atrial fibrillation where using aspirin for stroke prevention. Unfortunately there is no documented arrhythmia to speak of. Is quite likely that some of her symptoms are related to anxiety or panic attacks. We talked about using deep breathing exercises and coming mechanisms.      Relevant Orders   EKG 12-Lead (Completed)   VAS US CAROTID   Syncope    No recurrent symptoms. Back driving. Having some dizzy spells but nothing like or drop attack in January. I tried explained the patient the concept of a vagal syncopal episode. I think she and her husband understand now. They had a better understanding than previously.      Takotsubo cardiomyopathy -- postop stress-induced: Resolved (Chronic)    She is now had at least 2 echocardiograms that have shown normal function with no regional wall motion modalities. No significant diastolic dysfunction or valvular lesions. She remains on Toprol as much for palpitations as for the prior cardiomyopathy. On Imdur for possible microvascular symptoms.         Current medicines are reviewed at length with the patient today. (+/- concerns) none The following changes have been made:None   Carotid Dopplers  Deep breathing/calming mechanisms for episodes of palpitations.  Follow-up 6 months.   [name], M.D., M.S. Interventional Cardiologist   Pager # [phone]

## 2014-09-15 ENCOUNTER — Encounter: Payer: Self-pay | Admitting: Cardiology

## 2014-09-15 DIAGNOSIS — I201 Angina pectoris with documented spasm: Secondary | ICD-10-CM | POA: Insufficient documentation

## 2014-09-15 DIAGNOSIS — Z8673 Personal history of transient ischemic attack (TIA), and cerebral infarction without residual deficits: Secondary | ICD-10-CM | POA: Insufficient documentation

## 2014-09-15 NOTE — Assessment & Plan Note (Addendum)
She is now had at least 2 echocardiograms that have shown normal function with no regional wall motion modalities. No significant diastolic dysfunction or valvular lesions. She remains on Toprol as much for palpitations as for the prior cardiomyopathy. On Imdur for possible microvascular symptoms.

## 2014-09-15 NOTE — Assessment & Plan Note (Signed)
No recurrent symptoms. Back driving. Having some dizzy spells but nothing like or drop attack in January. I tried explained the patient the concept of a vagal syncopal episode. I think she and her husband understand now. They had a better understanding than previously.

## 2014-09-15 NOTE — Assessment & Plan Note (Signed)
On statin. Labs are followed by PCP.

## 2014-09-15 NOTE — Assessment & Plan Note (Signed)
Well-controlled. I don't think her syncopal episode was related to orthostatic symptoms. Continue beta blocker and ARB. Also on triamterene. We need to monitor for dehydration as another potential etiology for her syncope.

## 2014-09-15 NOTE — Assessment & Plan Note (Signed)
She still has short intermittent episodes of chest discomfort they're similar to what she had with her Takotsubo. Will start low-dose Imdur for possible microvascular ischemia versus spasm.

## 2014-09-15 NOTE — Assessment & Plan Note (Signed)
Still no documented arrhythmias. Most these episodes occur at night and may happen a couple times a week. I talked her about potentially wearing a monitor again. She was not overly excited. Since there was a potential of atrial fibrillation where using aspirin for stroke prevention. Unfortunately there is no documented arrhythmia to speak of. Is quite likely that some of her symptoms are related to anxiety or panic attacks. We talked about using deep breathing exercises and coming mechanisms.

## 2014-09-15 NOTE — Assessment & Plan Note (Signed)
Suggested by CT scan. No residual effects. Likely a chronic finding. In light of this finding and her syncopal episode, we will check carotid Dopplers to ensure there is no significant carotid stenoses.

## 2014-09-28 ENCOUNTER — Ambulatory Visit (HOSPITAL_COMMUNITY)
Admission: RE | Admit: 2014-09-28 | Discharge: 2014-09-28 | Disposition: A | Discharge status: Home / Self Care | Payer: Medicare Other | Source: Ambulatory Visit | Attending: Cardiology | Admitting: Cardiology

## 2014-09-28 DIAGNOSIS — Z8673 Personal history of transient ischemic attack (TIA), and cerebral infarction without residual deficits: Secondary | ICD-10-CM | POA: Diagnosis not present

## 2014-09-28 DIAGNOSIS — I10 Essential (primary) hypertension: Secondary | ICD-10-CM

## 2014-09-28 DIAGNOSIS — E785 Hyperlipidemia, unspecified: Secondary | ICD-10-CM

## 2014-09-28 DIAGNOSIS — R002 Palpitations: Secondary | ICD-10-CM | POA: Diagnosis present

## 2014-10-04 ENCOUNTER — Telehealth: Payer: Self-pay | Admitting: *Deleted

## 2014-10-04 NOTE — Telephone Encounter (Signed)
-----   Message from [name], MD sent at 10/03/2014 12:11 AM EDT ----- Normal carotid arteries, bilaterally.  Normal subclavian arteries, bilaterally.  Patent vertebral arteries with antregrade flow.  Good News - normal study evaluating Carotid Arteries.  HARDING, Leonie Green, MD  Pls forward to Raina Mina, MD - PCP

## 2014-10-04 NOTE — Telephone Encounter (Signed)
SPOKE TO HUSBAND- PATIENT IS NOT AT HOME AT PRESENT TIME  RESULT OF CAROTID GIVEN , VOICED UNDERSTANDING.

## 2014-11-03 ENCOUNTER — Other Ambulatory Visit: Payer: Self-pay | Admitting: Cardiology

## 2014-11-03 NOTE — Telephone Encounter (Signed)
Rx request sent to pharmacy.  

## 2014-12-28 ENCOUNTER — Other Ambulatory Visit: Payer: Self-pay | Admitting: Cardiology

## 2015-03-10 ENCOUNTER — Ambulatory Visit (INDEPENDENT_AMBULATORY_CARE_PROVIDER_SITE_OTHER): Payer: Medicare Other | Admitting: Cardiology

## 2015-03-10 ENCOUNTER — Encounter: Payer: Self-pay | Admitting: Cardiology

## 2015-03-10 VITALS — BP 138/80 | HR 79 | Ht 63.0 in | Wt 152.6 lb

## 2015-03-10 DIAGNOSIS — R5383 Other fatigue: Secondary | ICD-10-CM | POA: Diagnosis not present

## 2015-03-10 DIAGNOSIS — I5181 Takotsubo syndrome: Secondary | ICD-10-CM

## 2015-03-10 DIAGNOSIS — I201 Angina pectoris with documented spasm: Secondary | ICD-10-CM

## 2015-03-10 DIAGNOSIS — E785 Hyperlipidemia, unspecified: Secondary | ICD-10-CM

## 2015-03-10 DIAGNOSIS — I10 Essential (primary) hypertension: Secondary | ICD-10-CM

## 2015-03-10 DIAGNOSIS — R002 Palpitations: Secondary | ICD-10-CM

## 2015-03-10 NOTE — Progress Notes (Signed)
PCP: Gilford Rile, MD  Clinic Note: Chief Complaint  Patient presents with  . Follow-up    6 months:  No complaints of chest pain or dizziness.  SOB with minimal activity.  Mild left leg edema.  . Fatigue    HPI: Sarah Flores is a 70 y.o. female with a PMH below who presents today for six-month follow-up. She has a history of Takotsubo cardiomyopathy following back surgery in December 2014 -- actually had chest pain and ST elevations. This led to emergent cath that showed minimal CAD.Marland Kitchen She had recovery of cardiac function on follow-up echo  By December 2015.  Sarah Flores was last seen on September 14, 2015.  Recent Hospitalizations: None  Studies Reviewed: Carotid Dopplers in July 2016 showed normal carotid arteries and vertebral arteries.  Interval History: Sarah Flores comes in today noting that she really just feels "tired all the time. She just feels like she is a hard time getting her heart rate up when she first starts trying to exercise or do any activity. She says that she gets short of breath and tired with minimal activity. She does have a little swelling but no PND or orthopnea. She doesn't have any palpitations or rapid heart rates. No sick. Near syncope. No chest tightness or pressure with rest or exertion.   No TIA/amaurosis fugax symptoms. No melena, hematochezia, hematuria, or epstaxis. No claudication.  ROS: A comprehensive was performed. Review of Systems  Constitutional: Positive for malaise/fatigue (Exercise intolerance. Hard to get going.).  Cardiovascular: Negative for claudication.  Gastrointestinal: Negative for blood in stool and melena.  Genitourinary: Negative for hematuria.  Musculoskeletal: Positive for joint pain.  Neurological: Positive for dizziness (Positional).  Psychiatric/Behavioral: Negative for depression.  All other systems reviewed and are negative.   Past Medical History  Diagnosis Date  . 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 Hypertension   . Fibromyalgia   . DVT (deep venous thrombosis) ([place]) 03/1991    "right arm; from IV I had when I had my hysterectomy; on Coumadin for awhile" (02/18/2013)  . Dyslipidemia, goal LDL below 70   . Cardiogenic shock ([place])   . ST elevation myocardial infarction (STEMI) of lateral wall ([place])     a. Post-op STEMI 02/2013 with cardiogenic shock, cath revealing clean cors and LV dysfunction c/w Takasubo Syndrome.  . Takotsubo cardiomyopathy   . CKD (chronic kidney disease), stage II   . NASH (nonalcoholic steatohepatitis)     Dr. Percell Miller in [place]  . Diabetes mellitus ([place])   . Arthritis   . Stroke [facility])     a. CT head at [facility] 03/2014: No acute intracranial abnormality, probable small old cortical infarct in L posterior parietal region.  . Syncope     a. Reported prior near-syncope in 2015 with suggestion of atrial fibrillation but no documentation to support this. An event monitor 07/2013 did not show significant arrhythmia - mostly NSR 70-100, with occasional S Tachy ~100-115, rare PACs & PVCs - not recorded as symptomatic. b. Syncope 03/2014 felt vasovagal.  . Palpitations     a. event monitor 07/2013 did not show significant arrhythmia - mostly NSR 70-100, with occasional S Tachy ~100-115, rare PACs & PVCs - not recorded as symptomatic.    Past Surgical History  Procedure Laterality Date  . Parathyroidectomy  08/07/2009    [facility]  . Colonoscopy w/ biopsies    . Laryngoscopy      laryngoscopy micro  suspension with left Cymetra injection for dysphonia 09/29/09 [facility])  . Tonsillectomy  1950's  . Appendectomy  02/1966  . Cholecystectomy  ~2001  . Tubal ligation  1979  . Plantar's wart excision Bilateral 1960's; 1980  . Cardiac catheterization  1990 X2  . Abdominal hysterectomy  [id number]  . Maximum access (mas)posterior lumbar interbody fusion (plif) 1 level N/A 02/18/2013    Procedure: LUMBAR FIVE TO SACRAL ONE MAXIMUM ACCESS (MAS)  POSTERIOR LUMBAR INTERBODY FUSION (PLIF) 1 LEVEL;  Surgeon: Eustace Moore, MD;  Location: [place] NEURO ORS;  Service: Neurosurgery;  Laterality: N/A;  . Cardiac catheterization  02/22/2013    Nonischemic; no notable CAD. EF 40%.  . Transthoracic echocardiogram  02/18/2013    EF 35-30% with anterolateral, lateral and inferolateral/apical hypokinesis (Takotsubo)   . Transthoracic echocardiogram  02/25/2013    EF 55%. No clear regional wall motion abnormalities. Grade 3 diastolic dysfunction. Mild MR  . Left heart catheterization with coronary angiogram N/A 02/22/2013    Procedure: LEFT HEART CATHETERIZATION WITH CORONARY ANGIOGRAM;  Surgeon: Sanda Klein, MD;  Location: [place] CATH LAB;  Service: Cardiovascular; angiographically normal coronary arteries. LV dysfunction consistent with Takotsubo Cardiomyopathy/Syndrome   . Back surgery    . Transthoracic echocardiogram  December 2015; January 2016    a. Normal LV size and function. EF 55-60%. Gr1 DD;; b. Normal LV size function with EF 60-65%. No RWM/A no comment on valve lesions or abnormalities. No comment on diastolic function.    Prior to Admission medications   Medication Sig Start Date End Date Taking? Authorizing Provider  aspirin 325 MG tablet Take 325 mg by mouth daily.   Yes Historical Provider, MD  atorvastatin (LIPITOR) 10 MG tablet Take 10 mg by mouth daily.   Yes Historical Provider, MD  DULoxetine (CYMBALTA) 60 MG capsule Take 60 mg by mouth daily.   Yes Historical Provider, MD  glipiZIDE (GLUCOTROL XL) 10 MG 24 hr tablet Take 10 mg by mouth 2 (two) times daily.   Yes Historical Provider, MD  isosorbide mononitrate (IMDUR) 30 MG 24 hr tablet Take 1 tablet (30 mg total) by mouth daily. 12/29/14  Yes [name], MD  JARDIANCE 10 MG TABS tablet Take 1 tablet by mouth daily. Take 1 tab daily 06/30/14  Yes Historical Provider, MD  losartan (COZAAR) 100 MG tablet Take 100 mg by mouth daily.   Yes Historical Provider, MD  metFORMIN  (GLUCOPHAGE) 1000 MG tablet Take 500 mg by mouth 2 (two) times daily with a meal.    Yes Historical Provider, MD  metoprolol succinate (TOPROL-XL) 100 MG 24 hr tablet TAKE 1 TABLET BY MOUTH DAILY (TAKE WITH OR IMMEDIATELY FOLLOWING A MEAL) 11/03/14  Yes [name], MD  nitroGLYCERIN (NITROSTAT) 0.4 MG SL tablet Place 1 tablet (0.4 mg total) under the tongue every 5 (five) minutes as needed for chest pain. 03/25/13  Yes [name], MD  [facility] VERIO test strip Inject 1 strip as directed 2 (two) times daily. Use 1 strip to check glucose 1-2 times a day 08/16/14  Yes Historical Provider, MD  traMADol (ULTRAM) 50 MG tablet Take 1 tablet by mouth every 8 (eight) hours. Take 1 tab every 8 hours as needed for pain 08/29/14  Yes Historical Provider, MD  triamterene-hydrochlorothiazide (MAXZIDE-25) 37.5-25 MG per tablet Take 1 tablet by mouth daily.   Yes Historical Provider, MD  Vitamin D, Ergocalciferol, (DRISDOL) 50000 UNITS CAPS capsule Take 50,000 Units by mouth every 7 (seven) days. Wednesdays  Yes Historical Provider, MD   Allergies  Allergen Reactions  . Penicillins Anaphylaxis  . Benzoin     Burning - "looked like a 2nd degree burn"     Social History   Social History  . Marital Status: Married    Spouse Name: N/A  . Number of Children: N/A  . Years of Education: N/A   Occupational History  . Retired Network engineer    Social History Main Topics  . Smoking status: Never Smoker   . Smokeless tobacco: Never Used  . Alcohol Use: No  . Drug Use: No  . Sexual Activity: Not Currently   Other Topics Concern  . None   Social History Narrative   Lives with husband in [place], [place]. She is a married mother of 2 with 2 grandchildren. She has been married since 49. She is retired Network engineer having completed high school. She does not drink alcohol never smoked. She has not been exercising recently, but wants to get back to walking on a treadmill about 3 days a week.    Family History    Problem Relation Age of Onset  . Cancer Mother 78  . Heart attack Mother 1  . Heart attack Father 61  . Cancer Father 77    Leukemia  . Heart disease Brother   . Heart attack Maternal Grandfather   . Diabetes Paternal Grandmother   . Heart attack Paternal Grandfather     Wt Readings from Last 3 Encounters:  03/10/15 152 lb 9.6 oz (69.219 kg)  09/14/14 152 lb 6.4 oz (69.128 kg)  03/30/14 150 lb 6.4 oz (68.221 kg)    PHYSICAL EXAM BP 138/80 mmHg  Pulse 79  Ht 5\' 3"  (1.6 m)  Wt 152 lb 9.6 oz (69.219 kg)  BMI 27.04 kg/m2 General appearance: alert, cooperative, appears stated age, no distress and Well-nourished and well-groomed. HEENT: [place]/AT, EOMI, MMM, anicteric sclera Neck: no adenopathy, no carotid bruit, no JVD, supple, symmetrical, trachea midline and  Lungs: CTAB, normal percussion bilaterally and Nonlabored, good air movement  Heart: RRR, S1, S2 normal and Soft 1/6 HSM at apex. Otherwise no M./R./G.  Abdomen: soft, non-tender; bowel sounds normal; no masses, no organomegaly  Extremities: extremities normal, atraumatic, no cyanosis or edema  Pulses: 2+ and symmetric  Neurologic: Grossly normal; somewhat anxious affect but normal mood    Adult ECG Report  Rate: 79 ;  Rhythm: normal sinus rhythm and Normal axis, intervals and durations.;   Narrative Interpretation: Normal EKG   Other studies Reviewed: Additional studies/ records that were reviewed today include:  Recent Labs:  Monitored by PCP No results found for: CHOL, HDL, LDLCALC, LDLDIRECT, TRIG, CHOLHDL  ASSESSMENT / PLAN: Problem List Items Addressed This Visit    Takotsubo cardiomyopathy -- postop stress-induced: Resolved - Primary (Chronic)    Her EF is recovered based on recent echocardiogram. Remains on beta blocker because of palpitations. However this may be related to her exertional dyspnea / fatigue.  Hopefully we can back off on the beta blocker or at least change the dosing.  Will try taking  it in the evening as opposed to morning for now. Not requiring any diuretic. She is on losartan.        Palpitations (Chronic)    Probably just having PACs or PVCs. Since they occur at night, we will have moved her metoprolol dosing to nighttime. They have been relatively well improved with metoprolol.      Relevant Orders   EKG 12-Lead  Fatigue due to treatment (Chronic)    Change dosing time from metoprolol to evening couple hours before bedtime. If this does not help her fatigue, would potentially need to back off on Toprol.      Essential hypertension (Chronic)    Relatively well-controlled on losartan and Toprol. I do think if we can reduce her Toprol dose, we would need to add medication.      Relevant Orders   EKG 12-Lead   Dyslipidemia, goal LDL below 70 (Chronic)    By PCP. On statin.      Relevant Orders   EKG 12-Lead   Coronary artery spasm (HCC) (Chronic)    She is on low-dose Imdur. If we would add another blood pressure medication, my recommendation would be a dihydropyridine calcium channel blocker such as amlodipine or felodipine      Relevant Orders   EKG 12-Lead      Current medicines are reviewed at length with the patient today. (+/- concerns) exertional dyspnea & exercise intolerance The following changes have been made:  START TAKING METOPROLOL  AT BEDTIME- ( COUPLE HOURS BEFORE)  NO OTHER CHANGES WITH CURRENT MEDICATIONS.  Your physician wants you to follow-up in [place].  Studies Ordered:   Orders Placed This Encounter  Procedures  . EKG 12-Lead      [name], M.D., M.S. Interventional Cardiologist   Pager # [phone]

## 2015-03-10 NOTE — Patient Instructions (Addendum)
START TAKING METOPROLOL  AT BEDTIME- ( COUPLE HOURS BEFORE)  NO OTHER CHANGES WITH CURRENT MEDICATIONS.  Your physician wants you to follow-up in [place].  You will receive a reminder letter in the mail two months in advance. If you don't receive a letter, please call our office to schedule the follow-up appointment.   If you need a refill on your cardiac medications before your next appointment, please call your pharmacy.

## 2015-03-12 ENCOUNTER — Encounter: Payer: Self-pay | Admitting: Cardiology

## 2015-03-12 DIAGNOSIS — R5383 Other fatigue: Secondary | ICD-10-CM | POA: Insufficient documentation

## 2015-03-12 NOTE — Assessment & Plan Note (Signed)
Relatively well-controlled on losartan and Toprol. I do think if we can reduce her Toprol dose, we would need to add medication.

## 2015-03-12 NOTE — Assessment & Plan Note (Signed)
Change dosing time from metoprolol to evening couple hours before bedtime. If this does not help her fatigue, would potentially need to back off on Toprol.

## 2015-03-12 NOTE — Assessment & Plan Note (Signed)
Probably just having PACs or PVCs. Since they occur at night, we will have moved her metoprolol dosing to nighttime. They have been relatively well improved with metoprolol.

## 2015-03-12 NOTE — Assessment & Plan Note (Signed)
By PCP. On statin.

## 2015-03-12 NOTE — Assessment & Plan Note (Signed)
She is on low-dose Imdur. If we would add another blood pressure medication, my recommendation would be a dihydropyridine calcium channel blocker such as amlodipine or felodipine

## 2015-03-12 NOTE — Assessment & Plan Note (Signed)
Her EF is recovered based on recent echocardiogram. Remains on beta blocker because of palpitations. However this may be related to her exertional dyspnea / fatigue.  Hopefully we can back off on the beta blocker or at least change the dosing.  Will try taking it in the evening as opposed to morning for now. Not requiring any diuretic. She is on losartan.

## 2015-03-22 ENCOUNTER — Ambulatory Visit: Payer: Medicare Other | Admitting: Cardiology

## 2015-04-24 ENCOUNTER — Telehealth: Payer: Self-pay | Admitting: Cardiology

## 2015-04-24 NOTE — Telephone Encounter (Signed)
Received records from [facility] for appointment on 04/27/15 with Dr Ellyn Hack.  Records given to [facility] (medical records) for Dr Allison Quarry schedule on 04/27/15. lp

## 2015-04-27 ENCOUNTER — Ambulatory Visit: Payer: Medicare Other | Admitting: Cardiology

## 2015-05-29 ENCOUNTER — Ambulatory Visit: Payer: Medicare Other | Admitting: Internal Medicine

## 2015-08-09 ENCOUNTER — Other Ambulatory Visit: Payer: Self-pay | Admitting: Cardiology

## 2015-10-25 ENCOUNTER — Other Ambulatory Visit: Payer: Self-pay | Admitting: Cardiology

## 2015-10-25 NOTE — Telephone Encounter (Signed)
Rx(s) sent to pharmacy electronically.  

## 2016-03-20 ENCOUNTER — Other Ambulatory Visit: Payer: Self-pay | Admitting: Cardiology

## 2016-03-22 NOTE — Telephone Encounter (Signed)
Rx has been sent to the pharmacy electronically. ° °

## 2016-04-12 ENCOUNTER — Encounter: Payer: Self-pay | Admitting: Cardiology

## 2016-04-12 ENCOUNTER — Ambulatory Visit (INDEPENDENT_AMBULATORY_CARE_PROVIDER_SITE_OTHER): Payer: Medicare Other | Admitting: Cardiology

## 2016-04-12 VITALS — BP 136/80 | HR 77 | Ht 63.0 in | Wt 151.0 lb

## 2016-04-12 DIAGNOSIS — R002 Palpitations: Secondary | ICD-10-CM

## 2016-04-12 DIAGNOSIS — R0789 Other chest pain: Secondary | ICD-10-CM

## 2016-04-12 DIAGNOSIS — I5181 Takotsubo syndrome: Secondary | ICD-10-CM | POA: Diagnosis not present

## 2016-04-12 DIAGNOSIS — E785 Hyperlipidemia, unspecified: Secondary | ICD-10-CM | POA: Diagnosis not present

## 2016-04-12 DIAGNOSIS — R5383 Other fatigue: Secondary | ICD-10-CM

## 2016-04-12 MED ORDER — METOPROLOL TARTRATE 25 MG PO TABS: 25.0000 mg | ORAL_TABLET | Freq: Two times a day (BID) | ORAL | 6 refills | Status: DC | PRN

## 2016-04-12 NOTE — Patient Instructions (Addendum)
  THIS IS AN ADDITION TO TAKING METOPROLOL SUCCINATE  50 MG MAY use METOPROLOL TARTRATE 25 MG BY MOUTH  WITH EPISODE OF RAPID HEART RATE ,CAN USE UP TO TWICE A DAY   YOU CAN PURCHASE AN APP -- " [facility]ARDIA [employer]IVECOR" - IT WILL RUN A SHORT EKG STRIP     Your physician recommends that you schedule a follow-up appointment in [place]   Your physician wants you to follow-up in  [place].You will receive a reminder letter in the mail two months in advance. If you don't receive a letter, please call our office to schedule the follow-up appointment.   If you need a refill on your cardiac medications before your next appointment, please call your pharmacy.

## 2016-04-12 NOTE — Progress Notes (Signed)
PCP: Gilford Rile, MD  Clinic Note: Chief Complaint  Patient presents with  . Follow-up    1 year; h/o Takotsubo CM - resolved  . Shortness of Breath    occassionally.  . Chest Pain    tightness.  . Dizziness    occasionally.  . Leg Pain    cramping in legs.     HPI: Sarah Flores is a 71 y.o. female with a PMH below who presents today for annual f/u with a h/o Post-Op Takotsubo Cardiomyopathy. She has a history of Takotsubo cardiomyopathy following back surgery in December 2014 -- actually had chest pain and ST elevations. This led to emergent cath that showed minimal CAD.Marland Kitchen She had recovery of cardiac function on follow-up echo  By December 2015, but has had persistent DHF symptoms.  Sarah Flores was last seen on 03/10/2015 - She was doing relatively well just having some fatigue issues. No cardiac symptoms.  Recent Hospitalizations: None  Studies Reviewed: None   Interval History: Sarah Flores presents actually. She is now walking about 30 minutes at a time for 2 days a week. It takes her about an hour to do that. She does have some shortness of breath limits is not unexpected, but not with the initial part of exertion. She has some intermittent episodes of lightheadedness and dizziness but it sounds more like vertigo and orthostatic dizziness. She is not had any resting or exertional chest tightness or pressure. No significant palpitations either. She thinks some of the dyspnea is related to her recently diagnosed anemia. As result, she will have some episodes where she will definitely feel short of breath more so than usual with exertion.  She does have several spells where she feels her heart rate will go up well above 100 without any inciting event. She feels short of breath with this and somewhat dizzy. Usually does not occur with activity, however he can. Usually occurs at night or early in the morning.  No PND, orthopnea or edema.   No syncope/near syncope, or  TIA/amaurosis fugax symptoms. No melena, hematochezia, hematuria, or epstaxis. No claudication.  ROS: A comprehensive was performed. Review of Systems  HENT: Negative for congestion.   Respiratory: Positive for shortness of breath (Per history of present illness). Negative for cough.   Cardiovascular:       Per history of present illness  Gastrointestinal: Negative for constipation.  Musculoskeletal: Negative.   Neurological: Positive for dizziness.  Psychiatric/Behavioral: Negative for memory loss. The patient is nervous/anxious.   All other systems reviewed and are negative.   Past Medical History:  Diagnosis Date  . Arthritis   . Cardiogenic shock ([place])   . CKD (chronic kidney disease), stage II   . 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 Diabetes mellitus ([place])   . DVT (deep venous thrombosis) ([place]) 03/1991   "right arm; from IV I had when I had my hysterectomy; on Coumadin for awhile" (02/18/2013)  . Dyslipidemia, goal LDL below 70   . Fibromyalgia   . Hypertension   . NASH (nonalcoholic steatohepatitis)    Dr. Percell Miller in [place]  . Palpitations    a. event monitor 07/2013 did not show significant arrhythmia - mostly NSR 70-100, with occasional S Tachy ~100-115, rare PACs & PVCs - not recorded as symptomatic.  . ST elevation myocardial infarction (STEMI) of lateral wall ([place])    a. Post-op STEMI 02/2013 with cardiogenic shock, cath revealing clean cors and LV dysfunction  c/w Takasubo Syndrome.  . Stroke [facility])    a. CT head [facility] 03/2014: No acute intracranial abnormality, probable small old cortical infarct in L posterior parietal region.  . Syncope    a. Reported prior near-syncope in 2015 with suggestion of atrial fibrillation but no documentation to support this. An event monitor 07/2013 did not show significant arrhythmia - mostly NSR 70-100, with occasional S Tachy ~100-115, rare PACs & PVCs - not recorded as symptomatic. b.  Syncope 03/2014 felt vasovagal.  . Takotsubo cardiomyopathy     Past Surgical History:  Procedure Laterality Date  . ABDOMINAL HYSTERECTOMY  [id number]  . APPENDECTOMY  02/1966  . BACK SURGERY    . CARDIAC CATHETERIZATION  1990 X2  . CARDIAC CATHETERIZATION  02/22/2013   Nonischemic; no notable CAD. EF 40%.  . CHOLECYSTECTOMY  ~2001  . COLONOSCOPY W/ BIOPSIES    . LARYNGOSCOPY     laryngoscopy micro suspension with left Cymetra injection for dysphonia 09/29/09 [facility])  . LEFT HEART CATHETERIZATION WITH CORONARY ANGIOGRAM N/A 02/22/2013   Procedure: LEFT HEART CATHETERIZATION WITH CORONARY ANGIOGRAM;  Surgeon: Sanda Klein, MD;  Location: [facility] CATH LAB;  Service: Cardiovascular; angiographically normal coronary arteries. LV dysfunction consistent with Takotsubo Cardiomyopathy/Syndrome   . MAXIMUM ACCESS (MAS)POSTERIOR LUMBAR INTERBODY FUSION (PLIF) 1 LEVEL N/A 02/18/2013   Procedure: LUMBAR FIVE TO SACRAL ONE MAXIMUM ACCESS (MAS) POSTERIOR LUMBAR INTERBODY FUSION (PLIF) 1 LEVEL;  Surgeon: Eustace Moore, MD;  Location: [facility] NEURO [facility]S;  Service: Neurosurgery;  Laterality: N/A;  . PARATHYROIDECTOMY  08/07/2009   [place] Bilateral 1960's; 1980  . TONSILLECTOMY  1950's  . TRANSTHORACIC ECHOCARDIOGRAM  02/18/2013   EF 35-30% with anterolateral, lateral and inferolateral/apical hypokinesis (Takotsubo)   . TRANSTHORACIC ECHOCARDIOGRAM  02/25/2013   EF 55%. No clear regional wall motion abnormalities. Grade 3 diastolic dysfunction. Mild MR  . TRANSTHORACIC ECHOCARDIOGRAM  December 2015; January 2016   a. Normal LV size and function. EF 55-60%. Gr1 DD;; b. Normal LV size function with EF 60-65%. No RWM/A no comment on valve lesions or abnormalities. No comment on diastolic function.  . TUBAL LIGATION  1979    Current Meds  Medication Sig  . aspirin 325 MG tablet Take 325 mg by mouth daily.  Marland Kitchen atorvastatin (LIPITOR) 10 MG tablet Take 10 mg by mouth daily.  .  Dulaglutide (TRULICITY) A999333 0000000 SOPN Inject into the skin once a week.  Marland Kitchen glipiZIDE (GLUCOTROL XL) 10 MG 24 hr tablet Take 10 mg by mouth 2 (two) times daily.  . isosorbide mononitrate (IMDUR) 30 MG 24 hr tablet Take 1 tablet (30 mg total) by mouth daily.  Marland Kitchen losartan (COZAAR) 100 MG tablet Take 100 mg by mouth daily.  . metFORMIN (GLUCOPHAGE) 1000 MG tablet Take 500 mg by mouth 2 (two) times daily with a meal.   . metoprolol succinate (TOPROL-XL) 100 MG 24 hr tablet TAKE 1 TABLET DAILY (TAKE WITH OR IMMEDIATELY FOLLOWING A MEAL)  . nitroGLYCERIN (NITROSTAT) 0.4 MG SL tablet Place 1 tablet (0.4 mg total) under the tongue every 5 (five) minutes as needed for chest pain.  Glory Rosebush VERIO test strip Inject 1 strip as directed 2 (two) times daily. Use 1 strip to check glucose 1-2 times a day  . traMADol (ULTRAM) 50 MG tablet Take 1 tablet by mouth every 8 (eight) hours. Take 1 tab every 8 hours as needed for pain  . triamterene-hydrochlorothiazide (MAXZIDE-25) 37.5-25 MG per tablet Take 1 tablet by  mouth daily.  . [DISCONTINUED] DULoxetine (CYMBALTA) 60 MG capsule Take 60 mg by mouth daily.  . [DISCONTINUED] JARDIANCE 10 MG TABS tablet Take 1 tablet by mouth daily. Take 1 tab daily  . [DISCONTINUED] Vitamin D, Ergocalciferol, (DRISDOL) 50000 UNITS CAPS capsule Take 50,000 Units by mouth every 7 (seven) days. Wednesdays    Allergies  Allergen Reactions  . Penicillins Anaphylaxis  . Benzoin     Burning - "looked like a 2nd degree burn"    Social History   Social History  . Marital status: Married    Spouse name: N/A  . Number of children: N/A  . Years of education: N/A   Occupational History  . Retired Network engineer    Social History Main Topics  . Smoking status: Never Smoker  . Smokeless tobacco: Never Used  . Alcohol use No  . Drug use: No  . Sexual activity: Not Currently   Other Topics Concern  . None   Social History Narrative   Lives with husband in [place], [place].  She is a married mother of 2 with 2 grandchildren. She has been married since 77. She is retired Network engineer having completed high school. She does not drink alcohol never smoked. She has not been exercising recently, but wants to get back to walking on a treadmill about 3 days a week.     family history includes Cancer (age of onset: 48) in her mother; Cancer (age of onset: 6) in her father; Diabetes in her paternal grandmother; Heart attack in her maternal grandfather and paternal grandfather; Heart attack (age of onset: 48) in her father; Heart attack (age of onset: 24) in her mother; Heart disease in her brother.  Wt Readings from Last 3 Encounters:  04/12/16 68.5 kg (151 lb)  03/10/15 69.2 kg (152 lb 9.6 oz)  09/14/14 69.1 kg (152 lb 6.4 oz)    PHYSICAL EXAM BP 136/80   Pulse 77   Ht 5\' 3"  (1.6 m)   Wt 68.5 kg (151 lb)   BMI 26.75 kg/m  General appearance: alert, cooperative, appears stated age, no distress and Well-nourished and well-groomed. HEENT: [place]/AT, EOMI, MMM, anicteric sclera Neck: no adenopathy, no carotid bruit, no JVD, supple, symmetrical, trachea midline and  Lungs: CTAB, normal percussion bilaterally and Nonlabored, good air movement  Heart: RRR, S1, S2 normal and Soft 1/6 HSM at apex. Otherwise no M./R./G.  Abdomen: soft, non-tender; bowel sounds normal; no masses, no organomegaly  Extremities: extremities normal, atraumatic, no cyanosis or edema  Pulses: 2+ and symmetric  Neurologic: Grossly normal; somewhat anxious affect but normal mood   Adult ECG Report  Rate: 77 ;  Rhythm: normal sinus rhythm and Normal axis, intervals and durations.;   Narrative Interpretation: Stable EKG   Other studies Reviewed: Additional studies/ records that were reviewed today include:  Recent Labs:  No results found for: CHOL, HDL, LDLCALC, LDLDIRECT, TRIG, CHOLHDL Lab Results  Component Value Date   CREATININE 0.87 03/08/2014   BUN 16 03/08/2014   NA 137 03/08/2014    K 4.3 03/08/2014   CL 100 03/08/2014   CO2 24 03/08/2014    ASSESSMENT / PLAN: Problem List Items Addressed This Visit    Takotsubo cardiomyopathy -- postop stress-induced: Resolved (Chronic)    Recovered EF by echocardiogram. She does have some diastolic dysfunction with exertional dyspnea, but notably improved. She is on high-dose losartan as well as Toprol. She also takes Imdur for microvascular disease. I thought I switching beta blocker last time due  to some fatigue issues, but with her now having palpitation spells, more inclined to continue beta blocker.      Relevant Medications   metoprolol tartrate (LOPRESSOR) 25 MG tablet   Fatigue due to treatment (Chronic)    Fatigue has improved by changing dose time for Toprol. Monitor, but Sx not overly bothersome.      Dyslipidemia, goal LDL below 70 (Chronic)    On statin - monitored by PCP      Relevant Medications   metoprolol tartrate (LOPRESSOR) 25 MG tablet   Palpitations - Primary (Chronic)    Again, she is probably having PVCs or PACs, but it sounds like some of these may be rhythm related with possible A. fib or PAT. They're not having enough for her to wear a monitor and capture them, have Dr. about the smart phone application they can be used to check and monitor heart rhythms. It would be nice to know what rhythm she is in, but I don't think that we will capture otherwise.  Continue current dose of metoprolol. May use the additional short acting Lopressor 25 mg (given has an adjunct prescription or PRN) when necessary for rapid heart rate      Relevant Orders   EKG 12-Lead   Atypical chest pain    Had relatively normal coronary angiography & her symptoms are most likely not cardiac.         Current medicines are reviewed at length with the patient today. (+/- concerns) n/a The following changes have been made: n/a  Patient Instructions   THIS IS AN ADDITION TO TAKING METOPROLOL SUCCINATE  50 MG MAY use  METOPROLOL TARTRATE 25 MG BY MOUTH  WITH EPISODE OF RAPID HEART RATE ,CAN USE UP TO TWICE A DAY   YOU CAN PURCHASE AN APP -- " KARDIA ALIVECOR" - IT WILL RUN A SHORT EKG STRIP     Your physician recommends that you schedule a follow-up appointment in [place]   Your physician wants you to follow-up in  [place].You will receive a reminder letter in the mail two months in advance. If you don't receive a letter, please call our office to schedule the follow-up appointment.   If you need a refill on your cardiac medications before your next appointment, please call your pharmacy.    Studies Ordered:   Orders Placed This Encounter  Procedures  . EKG 12-Lead      Glenetta Hew, M.D., M.S. Interventional Cardiologist   Pager # [phone] Phone # [phone] [address] [place], [place] [address]

## 2016-04-14 ENCOUNTER — Encounter: Payer: Self-pay | Admitting: Cardiology

## 2016-04-14 NOTE — Assessment & Plan Note (Signed)
On statin - monitored by PCP 

## 2016-04-14 NOTE — Assessment & Plan Note (Signed)
Recovered EF by echocardiogram. She does have some diastolic dysfunction with exertional dyspnea, but notably improved. She is on high-dose losartan as well as Toprol. She also takes Imdur for microvascular disease. I thought I switching beta blocker last time due to some fatigue issues, but with her now having palpitation spells, more inclined to continue beta blocker.

## 2016-04-14 NOTE — Assessment & Plan Note (Signed)
Had relatively normal coronary angiography & her symptoms are most likely not cardiac.

## 2016-04-14 NOTE — Assessment & Plan Note (Signed)
Fatigue has improved by changing dose time for Toprol. Monitor, but Sx not overly bothersome.

## 2016-04-14 NOTE — Assessment & Plan Note (Addendum)
Again, she is probably having PVCs or PACs, but it sounds like some of these may be rhythm related with possible A. fib or PAT. They're not having enough for her to wear a monitor and capture them, have Dr. about the smart phone application they can be used to check and monitor heart rhythms. It would be nice to know what rhythm she is in, but I don't think that we will capture otherwise.  Continue current dose of metoprolol. May use the additional short acting Lopressor 25 mg (given has an adjunct prescription or PRN) when necessary for rapid heart rate

## 2016-10-20 NOTE — Progress Notes (Signed)
Cardiology Office Note    Date:  10/22/2016   ID:  Sarah Flores, DOB 03-31-1945, MRN [id number]  PCP:  Raina Mina., MD  Cardiologist: Dr. Ellyn Hack   Chief Complaint  Patient presents with  . Follow-up    Palpitations    History of Present Illness:    Sarah Flores is a 71 y.o. female with past medical history of takotsubo cardiomyopathy (occuring in 2014 with minimal CAD by cath), HTN, HLD, Type 2 DM, and palpitations who presents to the office today for 38-month follow-up.   She was last examined by Dr. Ellyn Hack in 04/2016 and reported intermittent episodes of lightheadedness and dizziness, thought to be most consistent with vertigo.  She did note occasional palpitations which were thought to be most consistent with PAC's/PVC's and she was continued on BB therapy.   In talking with the patient today, she reports overall doing well since her last office visit. She has experienced occasional episodes of palpitations with her most recent being on 10/12/2016. She was at a [employer] when she developed palpitations with associated lightheadedness and dizziness. Reports her heart rate was in the 120's by her monitor. She went home and took an additional short-acting Lopressor with resolution of her symptoms. She did proceed to the urgent care for evaluation and an EKG was performed at that time and normal by her report. She denies any recurrent episodes of tachycardia since. Per her report these usually only occur every 4-6 weeks. She does notice a "flipping" sensation along her chest occurring several times per week but denies any symptoms with this. She plans to purchase the AliveCor device but first had to obtain a phone which was compatible with the device.  She continues to be followed by GI for anemia with hemoglobin was at 12.7 with her last set of blood work. TSH was also checked by her PCP and normal at that time. She denies any alcohol or caffeine use.    Past Medical  History:  Diagnosis Date  . Arthritis   . Cardiogenic shock ([place])   . CKD (chronic kidney disease), stage II   . 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 Diabetes mellitus ([place])   . DVT (deep venous thrombosis) ([place]) 03/1991   "right arm; from IV I had when I had my hysterectomy; on Coumadin for awhile" (02/18/2013)  . Dyslipidemia, goal LDL below 70   . Fibromyalgia   . Hypertension   . NASH (nonalcoholic steatohepatitis)    Dr. Percell Miller in [place]  . Palpitations    a. event monitor 07/2013 did not show significant arrhythmia - mostly NSR 70-100, with occasional S Tachy ~100-115, rare PACs & PVCs - not recorded as symptomatic.  . ST elevation myocardial infarction (STEMI) of lateral wall ([place])    a. Post-op STEMI 02/2013 with cardiogenic shock, cath revealing clean cors and LV dysfunction c/w Takasubo Syndrome.  . Stroke [facility])    a. [facility] head [facility] 03/2014: No acute intracranial abnormality, probable small old cortical infarct in L posterior parietal region.  . Syncope    a. Reported prior near-syncope in 2015 with suggestion of atrial fibrillation but no documentation to support this. An event monitor 07/2013 did not show significant arrhythmia - mostly NSR 70-100, with occasional S Tachy ~100-115, rare PACs & PVCs - not recorded as symptomatic. b. Syncope 03/2014 felt vasovagal.  . Takotsubo cardiomyopathy     Past Surgical History:  Procedure Laterality  Date  . ABDOMINAL HYSTERECTOMY  [id number]  . APPENDECTOMY  02/1966  . BACK SURGERY    . CARDIAC CATHETERIZATION  1990 X2  . CARDIAC CATHETERIZATION  02/22/2013   Nonischemic; no notable CAD. EF 40%.  . CHOLECYSTECTOMY  ~2001  . COLONOSCOPY W/ BIOPSIES    . LARYNGOSCOPY     laryngoscopy micro suspension with left Cymetra injection for dysphonia 09/29/09 [facility])  . LEFT HEART CATHETERIZATION WITH CORONARY ANGIOGRAM N/A 02/22/2013   Procedure: LEFT HEART CATHETERIZATION WITH CORONARY ANGIOGRAM;   Surgeon: Sanda Klein, MD;  Location: [place] CATH LAB;  Service: Cardiovascular; angiographically normal coronary arteries. LV dysfunction consistent with Takotsubo Cardiomyopathy/Syndrome   . MAXIMUM ACCESS (MAS)POSTERIOR LUMBAR INTERBODY FUSION (PLIF) 1 LEVEL N/A 02/18/2013   Procedure: LUMBAR FIVE TO SACRAL ONE MAXIMUM ACCESS (MAS) POSTERIOR LUMBAR INTERBODY FUSION (PLIF) 1 LEVEL;  Surgeon: Eustace Moore, MD;  Location: [place] NEURO ORS;  Service: Neurosurgery;  Laterality: N/A;  . PARATHYROIDECTOMY  08/07/2009   [place] Bilateral 1960's; 1980  . TONSILLECTOMY  1950's  . TRANSTHORACIC ECHOCARDIOGRAM  02/18/2013   EF 35-30% with anterolateral, lateral and inferolateral/apical hypokinesis (Takotsubo)   . TRANSTHORACIC ECHOCARDIOGRAM  02/25/2013   EF 55%. No clear regional wall motion abnormalities. Grade 3 diastolic dysfunction. Mild MR  . TRANSTHORACIC ECHOCARDIOGRAM  December 2015; January 2016   a. Normal LV size and function. EF 55-60%. Gr1 DD;; b. Normal LV size function with EF 60-65%. No RWM/A no comment on valve lesions or abnormalities. No comment on diastolic function.  . TUBAL LIGATION  1979    Current Medications: Outpatient Medications Prior to Visit  Medication Sig Dispense Refill  . atorvastatin (LIPITOR) 10 MG tablet Take 10 mg by mouth daily.    . Dulaglutide (TRULICITY) 4.31 VQ/0.0QQ SOPN Inject into the skin once a week.    Marland Kitchen glipiZIDE (GLUCOTROL XL) 10 MG 24 hr tablet Take 10 mg by mouth 2 (two) times daily.    Marland Kitchen losartan (COZAAR) 100 MG tablet Take 100 mg by mouth daily.    . metFORMIN (GLUCOPHAGE) 1000 MG tablet Take 500 mg by mouth 2 (two) times daily with a meal.     . metoprolol succinate (TOPROL-XL) 100 MG 24 hr tablet TAKE 1 TABLET DAILY (TAKE WITH OR IMMEDIATELY FOLLOWING A MEAL) 90 tablet 2  . nitroGLYCERIN (NITROSTAT) 0.4 MG SL tablet Place 1 tablet (0.4 mg total) under the tongue every 5 (five) minutes as needed for chest pain. 25  tablet 6  . ONETOUCH VERIO test strip Inject 1 strip as directed 2 (two) times daily. Use 1 strip to check glucose 1-2 times a day  0  . traMADol (ULTRAM) 50 MG tablet Take 1 tablet by mouth every 8 (eight) hours. Take 1 tab every 8 hours as needed for pain  1  . triamterene-hydrochlorothiazide (MAXZIDE-25) 37.5-25 MG per tablet Take 1 tablet by mouth daily.    . isosorbide mononitrate (IMDUR) 30 MG 24 hr tablet Take 1 tablet (30 mg total) by mouth daily. 90 tablet 1  . metoprolol tartrate (LOPRESSOR) 25 MG tablet Take 1 tablet (25 mg total) by mouth 2 (two) times daily as needed. THIS IN ADDITION TO DAILY 50 MG TOPROL XL 60 tablet 6  . aspirin 325 MG tablet Take 325 mg by mouth daily.     No facility-administered medications prior to visit.      Allergies:   Penicillins and Benzoin   Social History   Social History  .  Marital status: Married    Spouse name: N/A  . Number of children: N/A  . Years of education: N/A   Occupational History  . Retired Network engineer    Social History Main Topics  . Smoking status: Never Smoker  . Smokeless tobacco: Never Used  . Alcohol use No  . Drug use: No  . Sexual activity: Not Currently   Other Topics Concern  . None   Social History Narrative   Lives with husband in [place], [place]. She is a married mother of 2 with 2 grandchildren. She has been married since 69. She is retired Network engineer having completed high school. She does not drink alcohol never smoked. She has not been exercising recently, but wants to get back to walking on a treadmill about 3 days a week.      Family History:  The patient's family history includes Cancer (age of onset: 73) in her mother; Cancer (age of onset: 78) in her father; Diabetes in her paternal grandmother; Heart attack in her maternal grandfather and paternal grandfather; Heart attack (age of onset: 10) in her father; Heart attack (age of onset: 53) in her mother; Heart disease in her brother.   Review of  Systems:   Please see the history of present illness.     General:  No chills, fever, night sweats or weight changes.  Cardiovascular:  No chest pain, dyspnea on exertion, edema, orthopnea, paroxysmal nocturnal dyspnea. Positive for palpitations and leg pain.  Dermatological: No rash, lesions/masses Respiratory: No cough, dyspnea Urologic: No hematuria, dysuria Abdominal:   No nausea, vomiting, diarrhea, bright red blood per rectum, melena, or hematemesis Neurologic:  No visual changes, wkns, changes in mental status. Positive for dizziness.  All other systems reviewed and are otherwise negative except as noted above.   Physical Exam:    VS:  BP 120/68   Pulse 78   Ht 5\' 3"  (1.6 m)   Wt 148 lb 9.6 oz (67.4 kg)   SpO2 98%   BMI 26.32 kg/m    General: Well developed, well nourished Caucasian female appearing in no acute distress. Head: Normocephalic, atraumatic, sclera non-icteric, no xanthomas, nares are without discharge.  Neck: No carotid bruits. JVD not elevated.  Lungs: Respirations regular and unlabored, without wheezes or rales.  Heart: Regular rate and rhythm. No S3 or S4.  No murmur, no rubs, or gallops appreciated. Abdomen: Soft, non-tender, non-distended with normoactive bowel sounds. No hepatomegaly. No rebound/guarding. No obvious abdominal masses. Msk:  Strength and tone appear normal for age. No joint deformities or effusions. Extremities: No clubbing or cyanosis. No lower extremity edema.  Distal pedal pulses are 2+ bilaterally. Neuro: Alert and oriented X 3. Moves all extremities spontaneously. No focal deficits noted. Psych:  Responds to questions appropriately with a normal affect. Skin: No rashes or lesions noted  Wt Readings from Last 3 Encounters:  10/22/16 148 lb 9.6 oz (67.4 kg)  04/12/16 151 lb (68.5 kg)  03/10/15 152 lb 9.6 oz (69.2 kg)     Studies/Labs Reviewed:   EKG:  EKG is not ordered today.    Recent Labs: No results found for requested  labs within last 8760 hours.   Lipid Panel No results found for: CHOL, TRIG, HDL, CHOLHDL, VLDL, LDLCALC, LDLDIRECT  Additional studies/ records that were reviewed today include:   Echocardiogram: 03/2014 Study Conclusions  - Left ventricle: The cavity size was normal. Systolic function was normal. The estimated ejection fraction was in the range of 60% to 65%.  Wall motion was normal; there were no regional wall motion abnormalities.  Cardiac Catheterization: 02/2013 Angiographic Findings:  1. The left main coronary artery is free of significant atherosclerosis and bifurcates in the usual fashion into the left anterior descending artery and left circumflex coronary artery.  2. The left anterior descending artery is a large vessel that reaches the apex and generates 1 major diagonal branch. There is evidence of mild luminal irregularities, especially in the distal third of the vessel and no calcification. No hemodynamically meaningful stenoses are seen. 3. The left circumflex coronary artery is a large-size vessel non- dominant vessel that generates only one major oblique marginal artery. There is evidence of minimal luminal irregularities and no calcification. No hemodynamically meaningful stenoses are seen. 4. The right coronary artery is a large-size dominant vessel that generates a long posterior lateral ventricular system as well as the PDA. There is evidence of minimal luminal irregularities and no calcification. No hemodynamically meaningful stenoses are seen.  5. The left ventricle is normal in size. The left ventricle systolic function is moderately decreased with an estimated ejection fraction of 40%. Regional wall motion abnormalities are seen, with a distribution typical for stress cardiomyopathy. There is severe midcavity hypokinesis with normal apical contractility and hyperdynamic base. No left ventricular thrombus is seen. There is no mitral insufficiency. The ascending  aorta appears normal. There is no aortic valve stenosis by pullback. The left ventricular end-diastolic pressure is 13 mm Hg.    IMPRESSIONS:  Postoperative stress cardiomyopathy RECOMMENDATION:  Continue supportive care. Remove pulmonary artery catheter. Wean off norepinephrine as allowed by blood pressure. Expected full recovery of myocardial function. Risk factor modification for early coronary atherosclerosis.   Assessment:    1. Palpitations   2. Takotsubo cardiomyopathy -- postop stress-induced: Resolved   3. Dyslipidemia, goal LDL below 70   4. Essential hypertension   5. Bilateral leg pain      Plan:   In order of problems listed above:  1. Palpitations - she has a history of palpitations with prior event monitor showing PAC's, PVC's, and sinus tachycardia. She notes episodes of tachycardia occurring sporadically and usually less than once per month. Most recent episode occurred on 10/12/2016 and lasted for 20-30 minutes, resolving with Metoprolol Tartrate. Noted associated dizziness and dyspnea.  - she denies any palpitations at this time. She plans to purchase the AliveCor device but first had to obtain a phone which was compatible with the device. - recent labs were checked by her PCP with Hgb at 12.7 and TSH WNL. She denies any alcohol or caffeine use. - will continue on Toprol-XL 100mg  daily with instructions to take Metoprolol Tartrate as needed. I encouraged her to send strips of her AliveCor device to our office if she has recurrent tachycardia.   2. History of Takotsubo Cardiomyopathy - occuring in 2014 with minimal CAD by cath at that time. Repeat echocardiogram in 03/2014 showed an improved EF of 60-65% with no WMA.  - no recent orthopnea, PND, or lower extremity edema.  - she remains on Losartan 100mg  daily and Toprol-XL 100mg  daily.   3. HLD - this is followed by her PCP. She remains on Atorvastatin 10mg  daily.  4. HTN - BP is well-controlled at 120/68  during today's visit.  - continue current medication regimen.   5. Bilateral Leg Pain - mostly occurs while at rest, not worse with activity. She has 2+ distal pulses bilaterally, therefore PVD a less likely etiology.  - recommended she follow-up with  her PCP.    Medication Adjustments/Labs and Tests Ordered: Current medicines are reviewed at length with the patient today.  Concerns regarding medicines are outlined above.  Medication changes, Labs and Tests ordered today are listed in the Patient Instructions below. Patient Instructions  Medication Instructions:  Your physician recommends that you continue on your current medications as directed. Please refer to the Current Medication list given to you today. If you need a refill on your cardiac medications before your next appointment, please call your pharmacy.  Follow-Up: Your physician wants you to follow-up in: [place] should receive a reminder letter in the mail two months in advance. If you do not receive a letter, please call our office November 2018 to schedule the February 2019 follow-up appointment.  Thank you for choosing [facility] at [facility], [place], [place]  10/22/2016 9:52 AM    [address], [address] [place], [place]  [address] Phone: [phone]; Fax: [phone]  [address], [place], [place] [address] Phone: [phone]

## 2016-10-22 ENCOUNTER — Encounter: Payer: Self-pay | Admitting: Student

## 2016-10-22 ENCOUNTER — Ambulatory Visit (INDEPENDENT_AMBULATORY_CARE_PROVIDER_SITE_OTHER): Payer: Medicare Other | Admitting: Student

## 2016-10-22 ENCOUNTER — Other Ambulatory Visit: Payer: Self-pay | Admitting: Cardiology

## 2016-10-22 VITALS — BP 120/68 | HR 78 | Ht 63.0 in | Wt 148.6 lb

## 2016-10-22 DIAGNOSIS — I10 Essential (primary) hypertension: Secondary | ICD-10-CM

## 2016-10-22 DIAGNOSIS — M79605 Pain in left leg: Secondary | ICD-10-CM | POA: Diagnosis not present

## 2016-10-22 DIAGNOSIS — E785 Hyperlipidemia, unspecified: Secondary | ICD-10-CM

## 2016-10-22 DIAGNOSIS — I5181 Takotsubo syndrome: Secondary | ICD-10-CM

## 2016-10-22 DIAGNOSIS — M79604 Pain in right leg: Secondary | ICD-10-CM | POA: Diagnosis not present

## 2016-10-22 DIAGNOSIS — R002 Palpitations: Secondary | ICD-10-CM | POA: Diagnosis not present

## 2016-10-22 MED ORDER — ISOSORBIDE MONONITRATE ER 30 MG PO TB24: 30.0000 mg | ORAL_TABLET | Freq: Every day | ORAL | 1 refills | Status: DC

## 2016-10-22 NOTE — Telephone Encounter (Signed)
REFILL 

## 2016-10-22 NOTE — Patient Instructions (Signed)
Medication Instructions:  Your physician recommends that you continue on your current medications as directed. Please refer to the Current Medication list given to you today. If you need a refill on your cardiac medications before your next appointment, please call your pharmacy.  Follow-Up: Your physician wants you to follow-up in: [place] should receive a reminder letter in the mail two months in advance. If you do not receive a letter, please call our office November 2018 to schedule the February 2019 follow-up appointment.  Thank you for choosing [facility] at [facility]!!

## 2017-04-25 ENCOUNTER — Encounter: Payer: Self-pay | Admitting: Cardiology

## 2017-04-25 ENCOUNTER — Ambulatory Visit (INDEPENDENT_AMBULATORY_CARE_PROVIDER_SITE_OTHER): Payer: Medicare Other | Admitting: Cardiology

## 2017-04-25 VITALS — BP 128/82 | HR 58 | Ht 63.0 in | Wt 150.4 lb

## 2017-04-25 DIAGNOSIS — I428 Other cardiomyopathies: Secondary | ICD-10-CM

## 2017-04-25 DIAGNOSIS — E785 Hyperlipidemia, unspecified: Secondary | ICD-10-CM | POA: Diagnosis not present

## 2017-04-25 DIAGNOSIS — I5181 Takotsubo syndrome: Secondary | ICD-10-CM | POA: Diagnosis not present

## 2017-04-25 DIAGNOSIS — R002 Palpitations: Secondary | ICD-10-CM

## 2017-04-25 DIAGNOSIS — I201 Angina pectoris with documented spasm: Secondary | ICD-10-CM | POA: Diagnosis not present

## 2017-04-25 DIAGNOSIS — I10 Essential (primary) hypertension: Secondary | ICD-10-CM

## 2017-04-25 NOTE — Progress Notes (Signed)
PCP: Raina Mina., MD  Clinic Note: No chief complaint on file.   HPI: Sarah Flores is a 72 y.o. female with a PMH notable for h/p Takotsubo CM.  She has a history of Takotsubo cardiomyopathy following back surgery in December 2014 (post-op spine surgery)-- actually had chest pain and ST elevations --> cath that showed minimal CAD.Marland Kitchen  She had recovery of cardiac function on follow-up echo by Decem 2015, but has had persistent DHF symptoms.  Sarah Flores was last seen by me in Feb 2018, but was actually seen in Aug by Bernerd Pho August for palpitations.  Reported having some heart rates up in the 120s, she took an extra beta-blocker and symptoms resolved. --Plan was to purchase Kardia by [employer] APP once she had a smart phone.  She would then continue on her current dose of Toprol plus as needed Lopressor  Recent Hospitalizations:  One episode leading to an urgent care visit for increased heart rate.  By the time she got there, was better.  Improved with vagal maneuvers.  Studies Reviewed: None   Interval History: Sarah Flores presents today overall feeling fine.  She still has episodes of fast heartbeats, but is only had to take an additional dose of metoprolol once.  Usually she says her heart rate does not really get above 100 on her watch monitor.  She may feel a little bit dizzy with it but has no syncope or near syncope associated.  There is one time when she was at a restaurant and she felt her heart rate go fast and she felt a little bit dizzy.  She decided not to go to urgent care, and it resolved within 20-30 minutes.  That time she felt dizzy when she stood up.  Otherwise she may have a little bit of exertional dyspnea and some mild vertigo type dizziness. Besides the intermittent  sporadic episodes of faster heartbeats, she has been doing quite fine.  No heart failure symptoms of PND, orthopnea or edema.  No chest tightness pressure with rest or exertion.  No TIA or  amaurosis fugax symptoms.  No claudication.   ROS: A comprehensive was performed. Review of Systems  HENT: Negative for congestion.   Respiratory: Negative for shortness of breath (Per history of present illness).   Cardiovascular:       Per history of present illness  Gastrointestinal: Negative for constipation.  Musculoskeletal: Negative.   Neurological: Positive for dizziness.  Psychiatric/Behavioral: Negative for memory loss. The patient is nervous/anxious.   All other systems reviewed and are negative.   Past Medical History:  Diagnosis Date  . Arthritis   . Cardiogenic shock ([place])   . CKD (chronic kidney disease), stage II   . 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 Diabetes mellitus ([place])   . DVT (deep venous thrombosis) ([place]) 03/1991   "right arm; from IV I had when I had my hysterectomy; on Coumadin for awhile" (02/18/2013)  . Dyslipidemia, goal LDL below 70   . Fibromyalgia   . Hypertension   . NASH (nonalcoholic steatohepatitis)    Dr. Percell Miller in [place]  . Palpitations    a. event monitor 07/2013 did not show significant arrhythmia - mostly NSR 70-100, with occasional S Tachy ~100-115, rare PACs & PVCs - not recorded as symptomatic.  . ST elevation myocardial infarction (STEMI) of lateral wall ([place])    a. Post-op STEMI 02/2013 with cardiogenic shock, cath revealing clean cors and  LV dysfunction c/w Takasubo Syndrome.  . Stroke [facility])    a. CT head at [facility] 03/2014: No acute intracranial abnormality, probable small old cortical infarct in L posterior parietal region.  . Syncope    a. Reported prior near-syncope in 2015 with suggestion of atrial fibrillation but no documentation to support this. An event monitor 07/2013 did not show significant arrhythmia - mostly NSR 70-100, with occasional S Tachy ~100-115, rare PACs & PVCs - not recorded as symptomatic. b. Syncope 03/2014 felt vasovagal.  . Takotsubo cardiomyopathy     Past  Surgical History:  Procedure Laterality Date  . ABDOMINAL HYSTERECTOMY  [id number]  . APPENDECTOMY  02/1966  . BACK SURGERY    . CARDIAC CATHETERIZATION  1990 X2  . CARDIAC CATHETERIZATION  02/22/2013   Nonischemic; no notable CAD. EF 40%.  . CHOLECYSTECTOMY  ~2001  . COLONOSCOPY W/ BIOPSIES    . LARYNGOSCOPY     laryngoscopy micro suspension with left Cymetra injection for dysphonia 09/29/09 [facility])  . LEFT HEART CATHETERIZATION WITH CORONARY ANGIOGRAM N/A 02/22/2013   Procedure: LEFT HEART CATHETERIZATION WITH CORONARY ANGIOGRAM;  Surgeon: Sanda Klein, MD;  Location: [facility] CATH LAB;  Service: Cardiovascular; angiographically normal coronary arteries. LV dysfunction consistent with Takotsubo Cardiomyopathy/Syndrome   . MAXIMUM ACCESS (MAS)POSTERIOR LUMBAR INTERBODY FUSION (PLIF) 1 LEVEL N/A 02/18/2013   Procedure: LUMBAR FIVE TO SACRAL ONE MAXIMUM ACCESS (MAS) POSTERIOR LUMBAR INTERBODY FUSION (PLIF) 1 LEVEL;  Surgeon: Eustace Moore, MD;  Location: [facility]URO [facility]S;  Service: Neurosurgery;  Laterality: N/A;  . PARATHYROIDECTOMY  08/07/2009   [place] Bilateral 1960's; 1980  . TONSILLECTOMY  1950's  . TRANSTHORACIC ECHOCARDIOGRAM  02/18/2013   EF 35-30% with anterolateral, lateral and inferolateral/apical hypokinesis (Takotsubo)   . TRANSTHORACIC ECHOCARDIOGRAM  02/25/2013   EF 55%. No clear regional wall motion abnormalities. Grade 3 diastolic dysfunction. Mild MR  . TRANSTHORACIC ECHOCARDIOGRAM  December 2015; January 2016   a. Normal LV size and function. EF 55-60%. Gr1 DD;; b. Normal LV size function with EF 60-65%. No RWM/A no comment on valve lesions or abnormalities. No comment on diastolic function.  . TUBAL LIGATION  1979    Current Meds  Medication Sig  . aspirin EC 81 MG tablet Take 81 mg by mouth daily.  Marland Kitchen atorvastatin (LIPITOR) 10 MG tablet Take 10 mg by mouth daily.  . Dulaglutide (TRULICITY) 9.92 EQ/6.8TM SOPN Inject into the skin once a week.    Marland Kitchen glipiZIDE (GLUCOTROL XL) 10 MG 24 hr tablet Take 10 mg by mouth 2 (two) times daily.  . isosorbide mononitrate (IMDUR) 30 MG 24 hr tablet Take 1 tablet (30 mg total) by mouth daily.  Marland Kitchen losartan (COZAAR) 100 MG tablet Take 100 mg by mouth daily.  . metFORMIN (GLUCOPHAGE) 1000 MG tablet Take 500 mg by mouth 2 (two) times daily with a meal.   . metoprolol succinate (TOPROL-XL) 100 MG 24 hr tablet TAKE 1 TABLET DAILY (TAKE WITH OR IMMEDIATELY FOLLOWING A MEAL)  . metoprolol tartrate (LOPRESSOR) 25 MG tablet Take 25 mg by mouth as directed.  . nitroGLYCERIN (NITROSTAT) 0.4 MG SL tablet Place 1 tablet (0.4 mg total) under the tongue every 5 (five) minutes as needed for chest pain.  Glory Rosebush VERIO test strip Inject 1 strip as directed 2 (two) times daily. Use 1 strip to check glucose 1-2 times a day  . traMADol (ULTRAM) 50 MG tablet Take 1 tablet by mouth every 8 (eight) hours. Take 1 tab  every 8 hours as needed for pain  . triamterene-hydrochlorothiazide (MAXZIDE-25) 37.5-25 MG per tablet Take 1 tablet by mouth daily.    Allergies  Allergen Reactions  . Penicillins Anaphylaxis  . Benzoin     Burning - "looked like a 2nd degree burn"    Social History   Socioeconomic History  . Marital status: Married    Spouse name: None  . Number of children: None  . Years of education: None  . Highest education level: None  Social Needs  . Financial resource strain: None  . Food insecurity - worry: None  . Food insecurity - inability: None  . Transportation needs - medical: None  . Transportation needs - non-medical: None  Occupational History  . Occupation: Retired Network engineer  Tobacco Use  . Smoking status: Never Smoker  . Smokeless tobacco: Never Used  Substance and Sexual Activity  . Alcohol use: No  . Drug use: No  . Sexual activity: Not Currently  Other Topics Concern  . None  Social History Narrative   Lives with husband in [place], [place]. She is a married mother of 2 with 2  grandchildren. She has been married since 43. She is retired Network engineer having completed high school. She does not drink alcohol never smoked. She has not been exercising recently, but wants to get back to walking on a treadmill about 3 days a week.     family history includes Cancer (age of onset: 12) in her mother; Cancer (age of onset: 59) in her father; Diabetes in her paternal grandmother; Heart attack in her maternal grandfather and paternal grandfather; Heart attack (age of onset: 41) in her father; Heart attack (age of onset: 34) in her mother; Heart disease in her brother.  Wt Readings from Last 3 Encounters:  04/25/17 150 lb 6.4 oz (68.2 kg)  10/22/16 148 lb 9.6 oz (67.4 kg)  04/12/16 151 lb (68.5 kg)    PHYSICAL EXAM BP 128/82   Pulse (!) 58   Ht 5\' 3"  (1.6 m)   Wt 150 lb 6.4 oz (68.2 kg)   BMI 26.64 kg/m   Physical Exam  Constitutional: She is oriented to person, place, and time. She appears well-developed and well-nourished. No distress.  HENT:  Head: Normocephalic and atraumatic.  Eyes: EOM are normal.  Neck: No hepatojugular reflux and no JVD present. Carotid bruit is not present.  Cardiovascular: Normal rate, regular rhythm and intact distal pulses.  No extrasystoles are present. PMI is not displaced. Exam reveals no gallop and no friction rub.  Murmur heard. High-pitched blowing holosystolic murmur is present with a grade of 1/6 at the apex. Very faint Pulmonary/Chest: Effort normal and breath sounds normal. No respiratory distress. She has no wheezes. She has no rales. She exhibits no tenderness.  Abdominal: Soft. Bowel sounds are normal. She exhibits no distension. There is no tenderness. There is no rebound.  Musculoskeletal: Normal range of motion. She exhibits no edema.  Neurological: She is alert and oriented to person, place, and time.  Psychiatric: She has a normal mood and affect. Her behavior is normal. Judgment and thought content normal.  Nursing note and  vitals reviewed.   Adult ECG Report  Rate: 58;  Rhythm: sinus bradycardia and Normal axis, intervals and durations.  Narrative Interpretation: Stable EKG   Other studies Reviewed: Additional studies/ records that were reviewed today include:  Recent Labs:  No results found for: CHOL, HDL, LDLCALC, LDLDIRECT, TRIG, CHOLHDL Lab Results  Component Value Date  CREATININE 0.87 03/08/2014   BUN 16 03/08/2014   NA 137 03/08/2014   K 4.3 03/08/2014   CL 100 03/08/2014   CO2 24 03/08/2014    ASSESSMENT / PLAN: Problem List Items Addressed This Visit    RESOLVED: Cardiomyopathy, postop stress    Relevant Medications   metoprolol tartrate (LOPRESSOR) 25 MG tablet   Other Relevant Orders   EKG 12-Lead   Coronary artery spasm (HCC) (Chronic)    At present, no active anginal type of symptoms to suggest recurrent coronary spasm.  I suspect that her event may very well of been related to spasm back she is postop.  Her blood pressure is well controlled.  We talked previously about considering dihydropyridine calcium channel blocker, but for now we will continue with low-dose Imdur.      Relevant Medications   metoprolol tartrate (LOPRESSOR) 25 MG tablet   Dyslipidemia, goal LDL below 70 (Chronic)    Continue to be monitored by her PCP on low-dose atorvastatin.      Relevant Medications   metoprolol tartrate (LOPRESSOR) 25 MG tablet   Essential hypertension (Chronic)    Relatively well controlled on current medications.  No change.      Relevant Medications   metoprolol tartrate (LOPRESSOR) 25 MG tablet   Palpitations - Primary (Chronic)    As far as I can tell, she has not had any true arrhythmias type symptoms.  She does have episodes where she feels her heart racing, but based on her home monitoring, does not seem to be that her rate is above 100 bpm.  She is probably is noticing short little bursts of PAT.  Rarely using her as needed metoprolol. We discussed vagal maneuvers as well  as another option.      Relevant Orders   EKG 12-Lead   Takotsubo cardiomyopathy -- postop stress-induced: Resolved (Chronic)    EF fully recovered by echocardiogram.  Simply has diastolic dysfunction with occasional exertional dyspnea. This was during a post spinal surgery event with significant EKG changes but no CAD noted.  She is on high-dose ARB and Toprol.  On Imdur for possible spasm, but not requiring any diuretic.      Relevant Medications   metoprolol tartrate (LOPRESSOR) 25 MG tablet   Other Relevant Orders   EKG 12-Lead      Discussed vagal maneuvers, okay to take additional dose of beta-blocker.  Current medicines are reviewed at length with the patient today. (+/- concerns) n/a The following changes have been made: n/a   Patient Instructions  Medication Instructions:  Your physician recommends that you continue on your current medications as directed. Please refer to the Current Medication list given to you today.  Follow-Up: Your physician wants you to follow-up in: 12 months with Dr. Ellyn Hack. You will receive a reminder letter in the mail two months in advance. If you don't receive a letter, please call our office to schedule the follow-up appointment.    If you need a refill on your cardiac medications before your next appointment, please call your pharmacy.     Studies Ordered:   Orders Placed This Encounter  Procedures  . EKG 12-Lead      Glenetta Hew, M.D., M.S. Interventional Cardiologist   Pager # [phone] Phone # [phone] [address] [place], [place] [address]

## 2017-04-25 NOTE — Patient Instructions (Signed)
Medication Instructions:  Your physician recommends that you continue on your current medications as directed. Please refer to the Current Medication list given to you today.  Follow-Up: Your physician wants you to follow-up in: 12 months with Dr. Ellyn Hack. You will receive a reminder letter in the mail two months in advance. If you don't receive a letter, please call our office to schedule the follow-up appointment.    If you need a refill on your cardiac medications before your next appointment, please call your pharmacy.

## 2017-04-27 NOTE — Assessment & Plan Note (Signed)
EF fully recovered by echocardiogram.  Simply has diastolic dysfunction with occasional exertional dyspnea. This was during a post spinal surgery event with significant EKG changes but no CAD noted.  She is on high-dose ARB and Toprol.  On Imdur for possible spasm, but not requiring any diuretic.

## 2017-04-27 NOTE — Assessment & Plan Note (Signed)
At present, no active anginal type of symptoms to suggest recurrent coronary spasm.  I suspect that her event may very well of been related to spasm back she is postop.  Her blood pressure is well controlled.  We talked previously about considering dihydropyridine calcium channel blocker, but for now we will continue with low-dose Imdur.

## 2017-04-27 NOTE — Assessment & Plan Note (Signed)
Continue to be monitored by her PCP on low-dose atorvastatin.

## 2017-04-27 NOTE — Assessment & Plan Note (Signed)
As far as I can tell, she has not had any true arrhythmias type symptoms.  She does have episodes where she feels her heart racing, but based on her home monitoring, does not seem to be that her rate is above 100 bpm.  She is probably is noticing short little bursts of PAT.  Rarely using her as needed metoprolol. We discussed vagal maneuvers as well as another option.

## 2017-04-27 NOTE — Assessment & Plan Note (Signed)
Relatively well controlled on current medications.  No change.

## 2017-07-21 ENCOUNTER — Other Ambulatory Visit: Payer: Self-pay | Admitting: Student

## 2017-07-21 DIAGNOSIS — M5416 Radiculopathy, lumbar region: Secondary | ICD-10-CM

## 2017-07-31 ENCOUNTER — Ambulatory Visit
Admission: RE | Admit: 2017-07-31 | Discharge: 2017-07-31 | Disposition: A | Discharge status: Home / Self Care | Payer: Medicare Other | Source: Ambulatory Visit | Attending: Student | Admitting: Student

## 2017-07-31 DIAGNOSIS — M5416 Radiculopathy, lumbar region: Secondary | ICD-10-CM

## 2017-07-31 MED ORDER — GADOBENATE DIMEGLUMINE 529 MG/ML IV SOLN
14.0000 mL | Freq: Once | INTRAVENOUS | Status: AC | PRN
  Administered 2017-07-31: 14 mL via INTRAVENOUS

## 2018-03-05 ENCOUNTER — Telehealth: Payer: Self-pay | Admitting: *Deleted

## 2018-03-05 NOTE — Telephone Encounter (Signed)
   [facility] Pre-operative Risk Assessment    Request for surgical clearance:  1. What type of surgery is being performed? Cataract extraction w/ intraocular lens implantation of the right eye followed by the left eye   2. When is this surgery scheduled? 03/06/18   3. What type of clearance is required (medical clearance vs. Pharmacy clearance to hold med vs. Both)? medical  4. Are there any medications that need to be held prior to surgery and how long? none   5. Practice name and name of physician performing surgery? [facility] Dr. Talbert Forest   6. What is your office phone number [phone]    7.   What is your office fax number [phone]  8.   Anesthesia type (None, local, MAC, general) ? Topical anesthesia with IV medication   Choua Chalker A Azora Bonzo 03/05/2018, 1:14 PM  _________________________________________________________________   (provider comments below)

## 2018-03-05 NOTE — Telephone Encounter (Signed)
   Primary Cardiologist: Dr Ellyn Hack  Chart reviewed as part of pre-operative protocol coverage. Given past medical history and time since last visit, based on ACC/AHA guidelines, CALI HOPE would be at acceptable risk for the planned procedure without further cardiovascular testing.   I will route this recommendation to the requesting party via Epic fax function and remove from pre-op pool.  Please call with questions.  Kerin Ransom, PA-C 03/05/2018, 4:13 PM

## 2018-05-20 ENCOUNTER — Telehealth: Payer: Self-pay

## 2018-05-20 NOTE — Telephone Encounter (Signed)
COVID-19 Pre-Screening Questions:  Provider: HARDING Needs f/u  >6 WEEKS MAY BE 3 MONTHS  . Have you been in contact with someone that was recently sick with fever/cough or confirmed to have the [place] virus?  NO  *Contact with a confirmed case should stay at home, away from confirmed patient, monitor symptoms, and reach out to PCP for e-visit/additional testing.  2. Do you have any of the following symptoms [cough, fever (100.4 or greater)], and/or shortness of breath)?  NO  *ALL PTS W/ FEVER SHOULD BE REFERRED TO PCP FOR E-VISIT* ________________________________________________________________________________  Cardiac Questionnaire:    Since your last visit or hospitalization:    1. Have you been having chest pain? NO   2. Have you been having shortness of breath? NO   3. Have you been having increasing edema, wt gain, or increase in abdominal girth (pants fitting more tightly)? NO   4. Have you had any passing out spells? NO    *A YES to any of these questions would result in the appointment being kept.  *If all the answers to these questions are NO, we should indicate that given the current situation regarding the worldwide coronarvirus pandemic, at the recommendation of the CDC, we are looking to limit gatherings in our waiting area, and thus will reschedule their appointment beyond four weeks from today.

## 2018-05-25 ENCOUNTER — Ambulatory Visit: Payer: Medicare Other | Admitting: Adult Health

## 2018-06-26 ENCOUNTER — Telehealth: Payer: Self-pay | Admitting: *Deleted

## 2018-06-26 NOTE — Telephone Encounter (Signed)
   TELEPHONE CALL NOTE  This patient has been deemed a candidate for follow-up tele-health visit to limit community exposure during the Covid-19 pandemic. I spoke with the patient via phone to discuss instructions.  The patient will receive a phone call 2-3 days prior to their E-Visit at which time consent will be verbally confirmed.   A Virtual Office Visit appointment type has been scheduled for 07/03/18 with [facility] patient prefers doxy/telephone type.    Raiford Simmonds, RN 06/26/2018 9:10 AM

## 2018-07-02 ENCOUNTER — Telehealth: Payer: Self-pay | Admitting: Cardiology

## 2018-07-02 NOTE — Telephone Encounter (Signed)
Smartphone/ my chart via email/ consent/ pre reg completed °

## 2018-07-03 ENCOUNTER — Encounter: Payer: Self-pay | Admitting: Cardiology

## 2018-07-03 ENCOUNTER — Telehealth (INDEPENDENT_AMBULATORY_CARE_PROVIDER_SITE_OTHER): Payer: Medicare Other | Admitting: Cardiology

## 2018-07-03 ENCOUNTER — Telehealth: Payer: Self-pay | Admitting: *Deleted

## 2018-07-03 VITALS — BP 126/78 | HR 76 | Ht 63.0 in | Wt 153.0 lb

## 2018-07-03 DIAGNOSIS — E785 Hyperlipidemia, unspecified: Secondary | ICD-10-CM

## 2018-07-03 DIAGNOSIS — R002 Palpitations: Secondary | ICD-10-CM

## 2018-07-03 DIAGNOSIS — I2129 ST elevation (STEMI) myocardial infarction involving other sites: Secondary | ICD-10-CM

## 2018-07-03 DIAGNOSIS — I10 Essential (primary) hypertension: Secondary | ICD-10-CM

## 2018-07-03 DIAGNOSIS — I5181 Takotsubo syndrome: Secondary | ICD-10-CM

## 2018-07-03 DIAGNOSIS — I201 Angina pectoris with documented spasm: Secondary | ICD-10-CM

## 2018-07-03 MED ORDER — NITROGLYCERIN 0.4 MG SL SUBL: 0.4000 mg | SUBLINGUAL_TABLET | SUBLINGUAL | 6 refills | Status: AC | PRN

## 2018-07-03 NOTE — Patient Instructions (Addendum)
Medication Instructions:   If nighttime palpitation spells lasting more than about a half an hour, I would take the additional dose of metoprolol.  Otherwise continue as you are doing.  We will refill your sublingual nitroglycerin, could also try this if your heart rate continues to go fast at night.  If you need a refill on your cardiac medications before your next appointment, please call your pharmacy.   Lab work: None  If you have labs (blood work) drawn today and your tests are completely normal, you will receive your results only by: Marland Kitchen MyChart Message (if you have MyChart) OR . A paper copy in the mail If you have any lab test that is abnormal or we need to change your treatment, we will call you to review the results.  Testing/Procedures: None  Follow-Up: At [facility], you and your health needs are our priority.  As part of our continuing mission to provide you with exceptional heart care, we have created designated Provider Care Teams.  These Care Teams include your primary Cardiologist (physician) and Advanced Practice Providers (APPs -  Physician Assistants and Nurse Practitioners) who all work together to provide you with the care you need, when you need it. You will need a follow up appointment in  December-January 2021.  Please call our office 2 months in advance to schedule this appointment.  You may see DAVID HARDING,MD or one of the following Advanced Practice Providers on your designated Care Team:   Rosaria Ferries, PA-C . Jory Sims, DNP, ANP  Any Other Special Instructions Will Be Listed Below (If Applicable).

## 2018-07-03 NOTE — Assessment & Plan Note (Signed)
Not really having any anginal symptoms.  No signs of recurrent spasm.  No longer on Imdur. We will refill her sublingual nitroglycerin just in case, if she does start having symptoms, may need to consider adding calcium channel blocker.

## 2018-07-03 NOTE — Assessment & Plan Note (Signed)
It does not appear that she is had any true arrhythmias.  She does have those brief episodes lasting 10 to 15 minutes a couple nights a week.  Sounds to me like they may be more related to anxiety since they have been occurring more during the COVID-19 quarantine then they had before.  Not lasting long enough to consider taking the PRN metoprolol, however they do last longer than 30 minutes I wanted to do that.  Otherwise we will continue with 100 mg Toprol.  If these things are still bothering her when I see her back we can try a 14-day ZIO patch to reevaluate.

## 2018-07-03 NOTE — Telephone Encounter (Signed)
SPOKE TO PATIENT - INFORMATION GIVEN FROM TELE-VISIT . AVS SUMMARY WILL BE MAILED TO PATIENT . PATIENT VOICED UNDERSTANDING

## 2018-07-03 NOTE — Assessment & Plan Note (Signed)
Blood pressure well controlled on ARB and beta-blocker.  No change.

## 2018-07-03 NOTE — Progress Notes (Signed)
Virtual Visit via Video Note   This visit type was conducted due to national recommendations for restrictions regarding the COVID-19 Pandemic (e.g. social distancing) in an effort to limit this patient's exposure and mitigate transmission in our community.  Due to her co-morbid illnesses, this patient is at least at moderate risk for complications without adequate follow up.  This format is felt to be most appropriate for this patient at this time.  All issues noted in this document were discussed and addressed.  A limited physical exam was performed with this format.  Please refer to the patient's chart for her consent to telehealth for [facility].   Patient has given verbal permission to conduct this visit via virtual appointment and to bill insurance 07/03/2018 11:45 AM     Evaluation Performed:  Follow-up visit  Date:  07/03/2018   ID:  Sarah Flores, DOB 09-Jan-1946, MRN [id number]  Patient Location: Home Provider Location: Other:  Hospital  PCP:  Raina Mina., MD  Cardiologist:  Glenetta Hew, MD  Electrophysiologist:  None   Chief Complaint: Annual follow-up, history of Takotsubo cardiomyopathy from coronary artery spasm and regular heartbeats.  History of Present Illness:    Sarah Flores is a 73 y.o. female with PMH notable for history of Takotsubo cardiomyopathy following back surgery in December 14 now followed by history of palpitations.  She presents via [employer], civil (consulting) for a telehealth visit today for annual follow-up.  Sarah Flores was last seen in February 2019.  Was otherwise doing well.  Occasionally had episodes of fast heart rates but only had to take extra metoprolol once.  May feel a little bit dizzy with these spells, but no syncope or near syncope.  Hospital - Aug 2019 -PCP called b/c elevated Cr - renal failure.  D/c'd Maxide. Now - having anemia issues. Fe deficiency & B12 .  -- Flu/Bronchitis  Interval History:  Sarah Flores is overall doing  well now.  She maybe has 1 or 2 spells a week of feeling her heart racing "flip-flopping "at night.  It lasts about 15 to 20 minutes.  She may get little bit nauseated with it.  In fact one time she did have some nausea and epigastric type pain that when she threw up, this felt better.  She describes it is really feeling as though she is scared and is having a give a bad dream it will wake her up feeling short of breath she sits up to catch her breath and within 15-20 minutes goes away.  Nothing really triggers it, and usually resolves spontaneously. She does note that is been occurring more frequently during the COVID-19 restrictions, and thinks that may be triggering a lot of these episodes.  Otherwise, she is not really having any chest pain or pressure with rest or exertion.  She walks about a mile a day and has her baseline exertional dyspnea but is doing better now. She never did start taking the Imdur, and has not had to use any sublingual nitroglycerin.  Cardiovascular ROS: no chest pain or dyspnea on exertion positive for - palpitations, rapid heart rate and baseline DOE.  PND if Heart skipping negative for - edema, orthopnea, paroxysmal nocturnal dyspnea, shortness of breath or syncope/near syncope. TiA/amaurosis fugax  The patient does not have symptoms concerning for COVID-19 infection (fever, chills, cough, or new shortness of breath).  The patient is practicing social distancing.  ROS:  Please see the history of present illness.    Review  of Systems  Constitutional: Negative for malaise/fatigue and weight loss.  HENT: Negative for congestion and nosebleeds.   Eyes:       Recent cataract Sgx  Respiratory: Positive for shortness of breath (baseline with walking fast or palpitations). Negative for cough and wheezing.   Cardiovascular: Negative for leg swelling.  Gastrointestinal: Positive for nausea and vomiting.       1 episode of n/v overnight with palpitations & Upper Abdominal  pain -- no other spells  Musculoskeletal: Positive for back pain (stable) and joint pain (hip bursitis).       ~ RLS symptoms - legs hurt  Neurological: Positive for dizziness (positional). Negative for speech change, focal weakness, weakness and headaches.  All other systems reviewed and are negative.   Past Medical History:  Diagnosis Date  . Arthritis   . Cardiogenic shock ([place])   . CKD (chronic kidney disease), stage II   . 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 Diabetes mellitus ([place])   . DVT (deep venous thrombosis) ([place]) 03/1991   "right arm; from IV I had when I had my hysterectomy; on Coumadin for awhile" (02/18/2013)  . Dyslipidemia, goal LDL below 70   . Fibromyalgia   . Hypertension   . NASH (nonalcoholic steatohepatitis)    Dr. Percell Miller in [place]  . Palpitations    a. event monitor 07/2013 did not show significant arrhythmia - mostly NSR 70-100, with occasional S Tachy ~100-115, rare PACs & PVCs - not recorded as symptomatic.  . ST elevation myocardial infarction (STEMI) of lateral wall ([place])    a. Post-op STEMI 02/2013 with cardiogenic shock, cath revealing clean cors and LV dysfunction c/w Takasubo Syndrome.  . Stroke [facility] [place])    a. CT head at [facility] 03/2014: No acute intracranial abnormality, probable small old cortical infarct in L posterior parietal region.  . Syncope    a. Reported prior near-syncope in 2015 with suggestion of atrial fibrillation but no documentation to support this. An event monitor 07/2013 did not show significant arrhythmia - mostly NSR 70-100, with occasional S Tachy ~100-115, rare PACs & PVCs - not recorded as symptomatic. b. Syncope 03/2014 felt vasovagal.  . Takotsubo cardiomyopathy    Past Surgical History:  Procedure Laterality Date  . ABDOMINAL HYSTERECTOMY  [id number]  . APPENDECTOMY  02/1966  . BACK SURGERY    . CARDIAC CATHETERIZATION  1990 X2  . CARDIAC CATHETERIZATION  02/22/2013   Nonischemic; no  notable CAD. EF 40%.  . CHOLECYSTECTOMY  ~2001  . COLONOSCOPY W/ BIOPSIES    . LARYNGOSCOPY     laryngoscopy micro suspension with left Cymetra injection for dysphonia 09/29/09 [facility])  . LEFT HEART CATHETERIZATION WITH CORONARY ANGIOGRAM N/A 02/22/2013   Procedure: LEFT HEART CATHETERIZATION WITH CORONARY ANGIOGRAM;  Surgeon: Sanda Klein, MD;  Location: [place] CATH LAB;  Service: Cardiovascular; angiographically normal coronary arteries. LV dysfunction consistent with Takotsubo Cardiomyopathy/Syndrome   . MAXIMUM ACCESS (MAS)POSTERIOR LUMBAR INTERBODY FUSION (PLIF) 1 LEVEL N/A 02/18/2013   Procedure: LUMBAR FIVE TO SACRAL ONE MAXIMUM ACCESS (MAS) POSTERIOR LUMBAR INTERBODY FUSION (PLIF) 1 LEVEL;  Surgeon: Eustace Moore, MD;  Location: [place] NEURO ORS;  Service: Neurosurgery;  Laterality: N/A;  . PARATHYROIDECTOMY  08/07/2009   [place] Bilateral 1960's; 1980  . TONSILLECTOMY  1950's  . TRANSTHORACIC ECHOCARDIOGRAM  02/18/2013   EF 35-30% with anterolateral, lateral and inferolateral/apical hypokinesis (Takotsubo)   . TRANSTHORACIC ECHOCARDIOGRAM  02/25/2013   EF  55%. No clear regional wall motion abnormalities. Grade 3 diastolic dysfunction. Mild MR  . TRANSTHORACIC ECHOCARDIOGRAM  December 2015; January 2016   a. Normal LV size and function. EF 55-60%. Gr1 DD;; b. Normal LV size function with EF 60-65%. No RWM/A no comment on valve lesions or abnormalities. No comment on diastolic function.  . TUBAL LIGATION  1979     Current Meds  Medication Sig  . aspirin EC 81 MG tablet Take 81 mg by mouth daily.  Marland Kitchen atorvastatin (LIPITOR) 10 MG tablet Take 10 mg by mouth daily.  . Dulaglutide (TRULICITY) 4.28 JG/8.1LX SOPN Inject into the skin once a week.  . ferrous sulfate 325 (65 FE) MG tablet Take 325 mg by mouth every other day.  Marland Kitchen glipiZIDE (GLUCOTROL XL) 10 MG 24 hr tablet Take 10 mg by mouth 2 (two) times daily.  Marland Kitchen losartan (COZAAR) 100 MG tablet Take 100 mg by  mouth daily.  . metFORMIN (GLUCOPHAGE-XR) 500 MG 24 hr tablet Take 1,000 mg by mouth 2 (two) times a day.  . metoprolol succinate (TOPROL-XL) 100 MG 24 hr tablet TAKE 1 TABLET DAILY (TAKE WITH OR IMMEDIATELY FOLLOWING A MEAL)  . metoprolol tartrate (LOPRESSOR) 25 MG tablet Take 25 mg by mouth as directed.  . nitroGLYCERIN (NITROSTAT) 0.4 MG SL tablet Place 1 tablet (0.4 mg total) under the tongue every 5 (five) minutes as needed for chest pain.  Glory Rosebush VERIO test strip Inject 1 strip as directed 2 (two) times daily. Use 1 strip to check glucose 1-2 times a day  . traMADol (ULTRAM) 50 MG tablet Take 1 tablet by mouth every 8 (eight) hours. Take 1 tab every 8 hours as needed for pain  . [DISCONTINUED] nitroGLYCERIN (NITROSTAT) 0.4 MG SL tablet Place 1 tablet (0.4 mg total) under the tongue every 5 (five) minutes as needed for chest pain.     Allergies:   Penicillins and Benzoin   Social History   Tobacco Use  . Smoking status: Never Smoker  . Smokeless tobacco: Never Used  Substance Use Topics  . Alcohol use: No  . Drug use: No     Family Hx: The patient's family history includes Cancer (age of onset: 37) in her mother; Cancer (age of onset: 51) in her father; Diabetes in her paternal grandmother; Heart attack in her maternal grandfather and paternal grandfather; Heart attack (age of onset: 32) in her father; Heart attack (age of onset: 2) in her mother; Heart disease in her brother.   Prior CV studies:   The following studies were reviewed today: . none  Labs/Other Tests and Data Reviewed:    EKG:  No ECG reviewed.  Recent Labs: No results found for requested labs within last 8760 hours.    Recent Lipid Panel No results found for: CHOL, TRIG, HDL, CHOLHDL, LDLCALC, LDLDIRECT  Wt Readings from Last 3 Encounters:  07/03/18 153 lb (69.4 kg)  04/25/17 150 lb 6.4 oz (68.2 kg)  10/22/16 148 lb 9.6 oz (67.4 kg)     Objective:    Vital Signs:  BP 126/78   Pulse 76   Ht  5\' 3"  (1.6 m)   Wt 153 lb (69.4 kg)   BMI 27.10 kg/m   VITAL SIGNS:  reviewed GEN:  Well nourished, well developed female in no acute distress. EYES:  sclerae anicteric, EOMI - Extraocular Movements Intact RESPIRATORY:  normal respiratory effort, symmetric expansion MUSCULOSKELETAL:  no obvious deformities. NEURO:  alert and oriented x 3, no obvious focal  deficit PSYCH:  normal affect  ASSESSMENT & PLAN:    Overall, Maikayla is doing well with no real major cardiac symptoms.  I think she is fine for annual follow-up routinely, however I would like to at least physically see her sometime in the end of this year or early next year.  Problem List Items Addressed This Visit    Coronary artery spasm (HCC) (Chronic)    Not really having any anginal symptoms.  No signs of recurrent spasm.  No longer on Imdur. We will refill her sublingual nitroglycerin just in case, if she does start having symptoms, may need to consider adding calcium channel blocker.      Relevant Medications   nitroGLYCERIN (NITROSTAT) 0.4 MG SL tablet   Dyslipidemia, goal LDL below 70 (Chronic)    On low-dose atorvastatin.  Labs followed by PCP.  Target LDL should be at least less than 100, but preferably <70.      Relevant Medications   nitroGLYCERIN (NITROSTAT) 0.4 MG SL tablet   Essential hypertension (Chronic)    Blood pressure well controlled on ARB and beta-blocker.  No change.      Relevant Medications   nitroGLYCERIN (NITROSTAT) 0.4 MG SL tablet   Palpitations (Chronic)    It does not appear that she is had any true arrhythmias.  She does have those brief episodes lasting 10 to 15 minutes a couple nights a week.  Sounds to me like they may be more related to anxiety since they have been occurring more during the COVID-19 quarantine then they had before.  Not lasting long enough to consider taking the PRN metoprolol, however they do last longer than 30 minutes I wanted to do that.  Otherwise we will continue  with 100 mg Toprol.  If these things are still bothering her when I see her back we can try a 14-day ZIO patch to reevaluate.      Takotsubo cardiomyopathy -- postop stress-induced: Resolved - Primary (Chronic)    Thankfully, her EF fully recovered.  Only now has some mild exertional dyspnea from diastolic dysfunction.  Thankfully, no significant CAD noted. Never did start Imdur.  We will refill her nitroglycerin just in case.  No further episodes to suggest spasm. We will continue low-dose statin as well as beta-blocker and ARB. No diuretic requirement.      Relevant Medications   nitroGLYCERIN (NITROSTAT) 0.4 MG SL tablet    Other Visit Diagnoses    ST elevation myocardial infarction (STEMI) of lateral wall (HCC)       Relevant Medications   nitroGLYCERIN (NITROSTAT) 0.4 MG SL tablet      COVID-19 Education: The signs and symptoms of COVID-19 were discussed with the patient and how to seek care for testing (follow up with PCP or arrange E-visit).   The importance of social distancing was discussed today.  Time:   Today, I have spent 24 minutes with the patient with telehealth technology discussing the above problems.     Medication Adjustments/Labs and Tests Ordered: Current medicines are reviewed at length with the patient today.  Concerns regarding medicines are outlined above.  Medication Instructions:  If palpitation spells last more than half an hour, I would then take the extra dose of metoprolol.  Otherwise you should be okay.  Tests Ordered: No orders of the defined types were placed in this encounter. None  Medication Changes: Meds ordered this encounter  Medications  . nitroGLYCERIN (NITROSTAT) 0.4 MG SL tablet    Sig: Place  1 tablet (0.4 mg total) under the tongue every 5 (five) minutes as needed for chest pain.    Dispense:  25 tablet    Refill:  6  None  Disposition:  Follow up In December-January    Signed, Louretta Tantillo, MD  07/03/2018 11:45 AM     [place]

## 2018-07-03 NOTE — Assessment & Plan Note (Signed)
Thankfully, her EF fully recovered.  Only now has some mild exertional dyspnea from diastolic dysfunction.  Thankfully, no significant CAD noted. Never did start Imdur.  We will refill her nitroglycerin just in case.  No further episodes to suggest spasm. We will continue low-dose statin as well as beta-blocker and ARB. No diuretic requirement.

## 2018-07-03 NOTE — Assessment & Plan Note (Addendum)
On low-dose atorvastatin.  Labs followed by PCP.  Target LDL should be at least less than 100, but preferably <70.

## 2018-09-20 IMAGING — MR MR LUMBAR SPINE WO/W CM
4 of 8 series · 22 of 48 positions shown · IV contrast (multihance)
Comparison: Lumbar radiographs 08/29/2014 and earlier.

CLINICAL DATA: 71-year-old female with 6 months of lumbar back pain
radiating to the right leg with associated weakness and numbness.
Fall 2 weeks ago with increased symptoms subsequent.

Creatinine was obtained on site at [HOSPITAL] at [HOSPITAL].
Results: Creatinine 1.0 mg/dL.
EXAM:
MRI LUMBAR SPINE WITHOUT AND WITH CONTRAST
TECHNIQUE: Multiplanar and multiecho pulse sequences of the lumbar spine were
obtained without and with intravenous contrast.
CONTRAST:  14mL MULTIHANCE GADOBENATE DIMEGLUMINE 529 MG/ML IV SOLN

[Series 6: T1 · sagittal · 4.0mm · 0.55mm/px · 3 of 12 slices shown (1 of 2)]
[im 1/12]
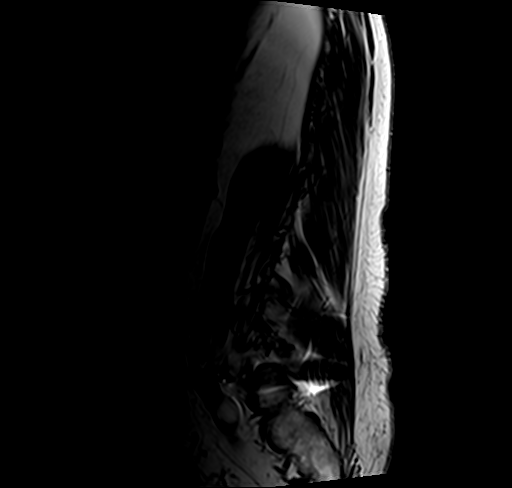
[im 6/12]
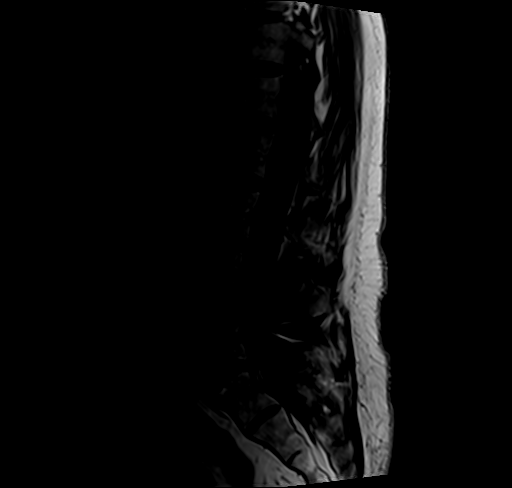
[im 12/12]
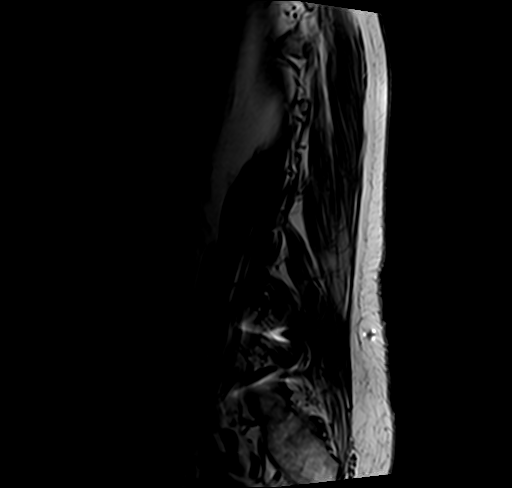

[Series 7: T1 · axial · 4.0mm · 0.37mm/px · z∈[-96,+72]mm · 7 of 33 slices shown (2 of 2)]
[im 1/33]
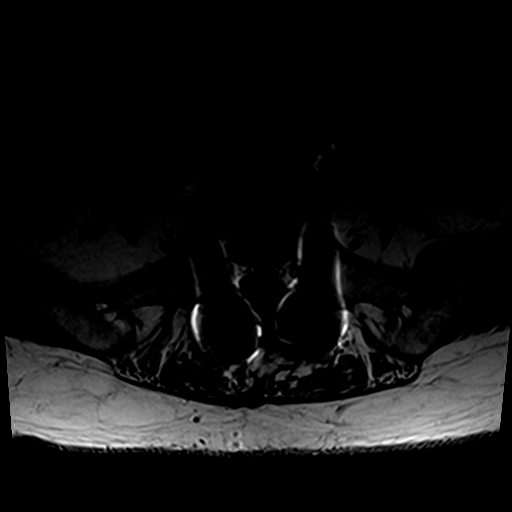
[im 4/33]
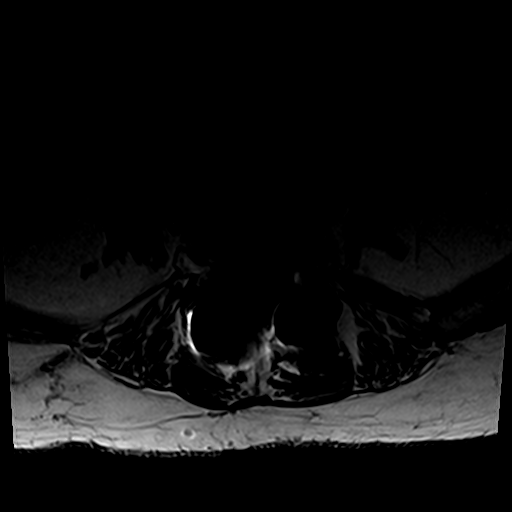
[im 11/33]
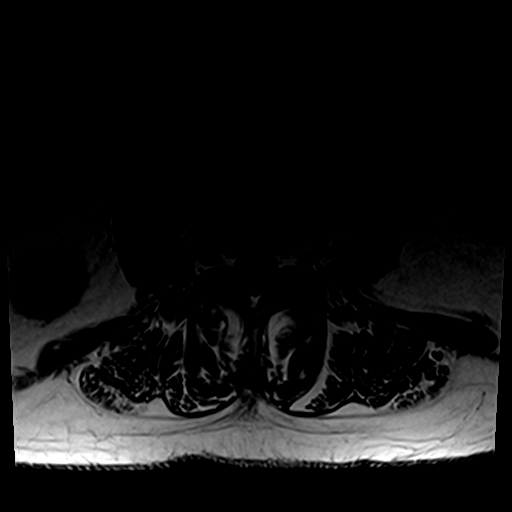
[im 15/33]
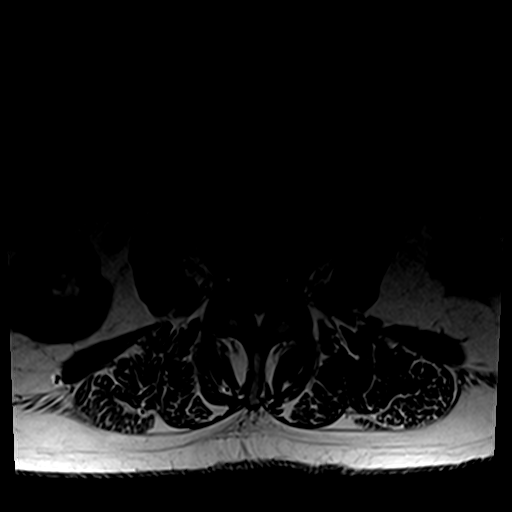
[im 18/33]
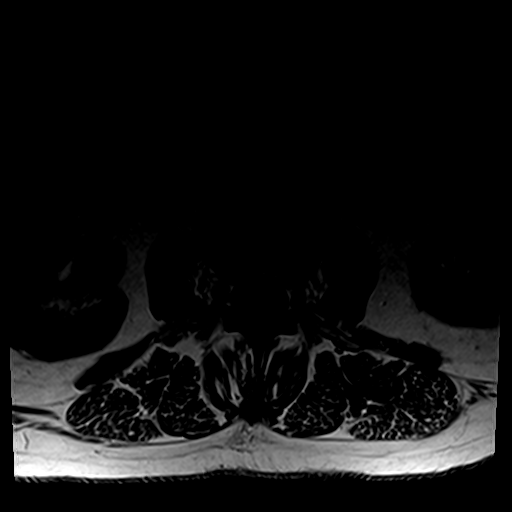
[im 22/33]
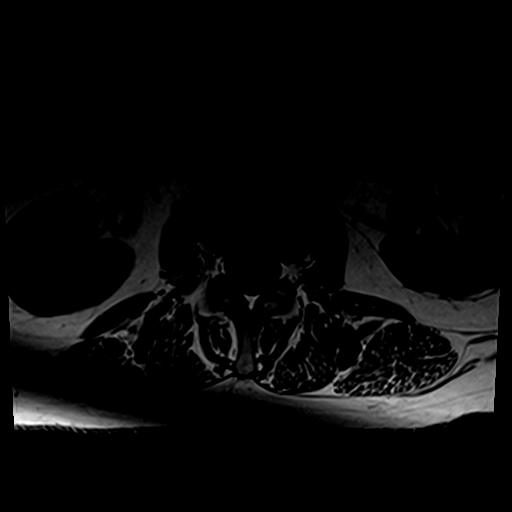
[im 29/33]
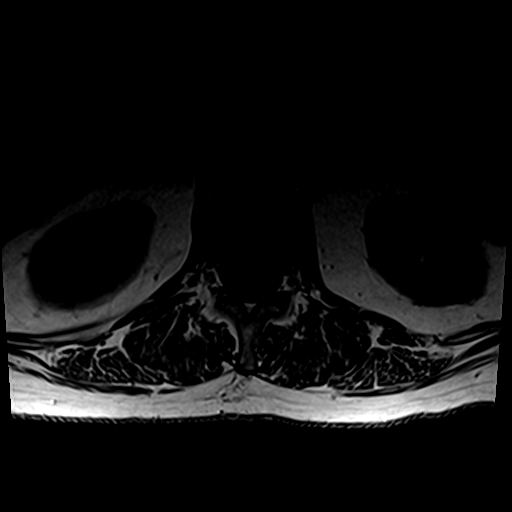

[Series 8: T2 · axial · 4.0mm · 0.74mm/px · z∈[-96,+100]mm · 8 of 33 slices shown (1 of 2)]
[im 1/33]
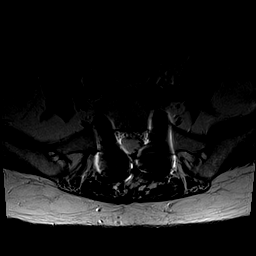
[im 4/33]
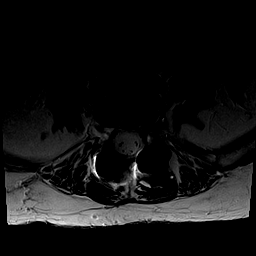
[im 11/33]
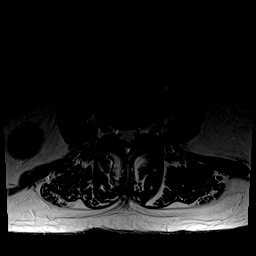
[im 15/33]
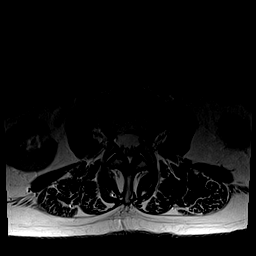
[im 18/33]
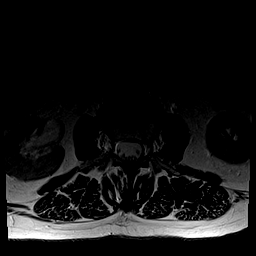
[im 22/33]
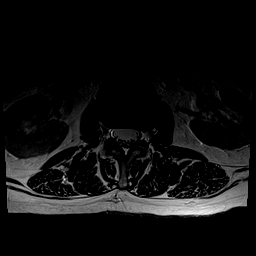
[im 29/33]
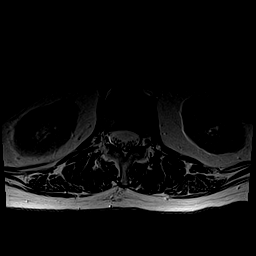
[im 33/33]
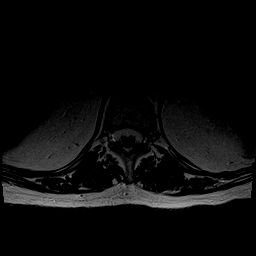

[Series 9: T2 · sagittal · 4.0mm · 0.44mm/px · 4 of 12 slices shown (2 of 2)]
[im 1/12]
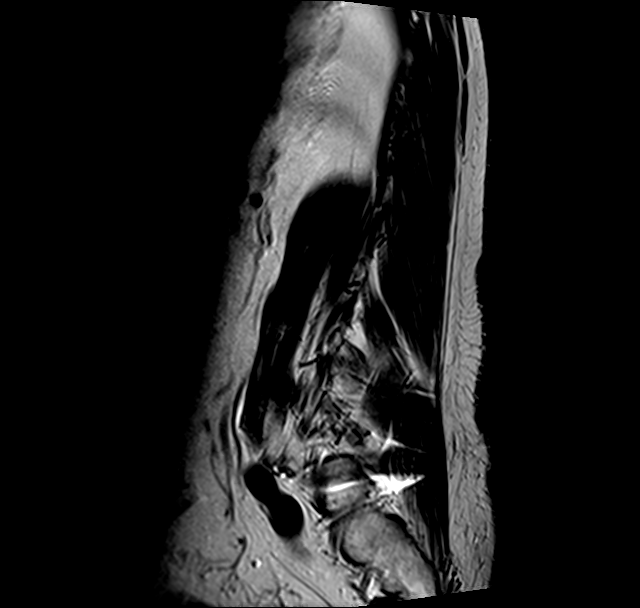
[im 4/12]
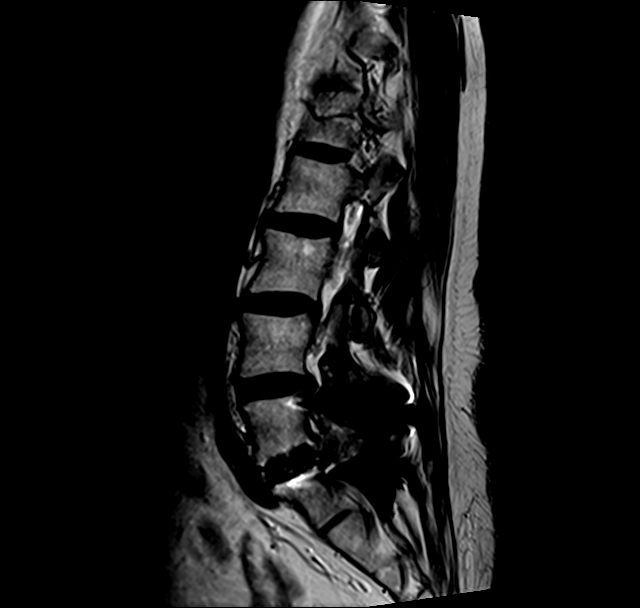
[im 8/12]
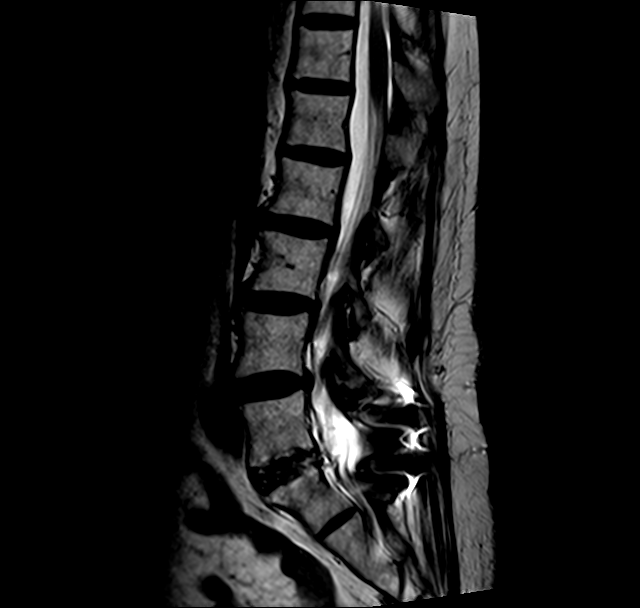
[im 12/12]
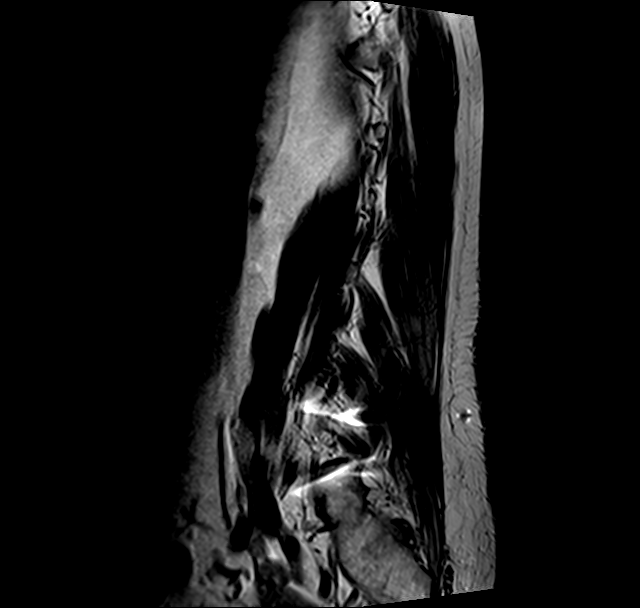

[22 of 48 positions shown; findings below may reference images not displayed]

FINDINGS: Segmentation: Normal on the comparison radiographs which is the same
numbering system used on the 0369 MRI (there is a vestigial S1-S2
disc space, but those levels are otherwise fully sacralized).

Alignment: Stable vertebral height and alignment since 0369. Mild
straightening of lumbar lordosis.

Vertebrae: Mild hardware susceptibility artifact at L5-S1. No marrow
edema or evidence of acute osseous abnormality. Visualized bone
marrow signal is within normal limits. Intact visible sacrum and SI
joints.

Conus medullaris and cauda equina: Conus extends to the L1 level.
Conus and cauda equina appear normal. No lower spinal cord or conus
signal abnormality. No abnormal intradural enhancement.

Paraspinal and other soft tissues: Negative visible abdominal
viscera. Minimal postoperative changes to the lower posterior
paraspinal soft tissues. No postoperative fluid collection.

Disc levels:

T11-T12: Negative.

T12-L1:  Negative.

L1-L2:  Negative.

L2-L3:  Minimal circumferential disc bulge is stable.  No stenosis.

L3-L4: Minimal circumferential disc bulge and mild facet and
ligament flavum hypertrophy are stable. No stenosis.

L4-L5: Increased broad-based posterior and mild left foraminal disc
bulging since 0369. There is a subtle new right paracentral disc
protrusion seen on series 9, image 6. Moderate facet and mild to
moderate ligament flavum hypertrophy has not significantly changed.
There is no subsequent stenosis.

L5-S1: Sequelae of decompression and fusion. Postoperative
granulation tissue in the left L5 neural foramen. Widely patent
thecal sac and right L5 foramen.
IMPRESSION: 1. Satisfactory postoperative appearance of the L5-S1 level, and
mild for age lumbar spine degeneration elsewhere.
2. Subtle progressed disc degeneration at L4-L5 since the 0369 MRI
but no spinal stenosis or convincing neural impingement.

## 2018-11-30 ENCOUNTER — Encounter (INDEPENDENT_AMBULATORY_CARE_PROVIDER_SITE_OTHER): Payer: Self-pay

## 2018-11-30 ENCOUNTER — Encounter: Payer: Self-pay | Admitting: Allergy and Immunology

## 2018-11-30 ENCOUNTER — Ambulatory Visit (INDEPENDENT_AMBULATORY_CARE_PROVIDER_SITE_OTHER): Payer: Medicare Other | Admitting: Allergy and Immunology

## 2018-11-30 ENCOUNTER — Other Ambulatory Visit: Payer: Self-pay

## 2018-11-30 VITALS — BP 144/98 | HR 56 | Temp 97.8°F | Resp 18 | Ht 62.0 in | Wt 153.0 lb

## 2018-11-30 DIAGNOSIS — I201 Angina pectoris with documented spasm: Secondary | ICD-10-CM | POA: Diagnosis not present

## 2018-11-30 DIAGNOSIS — T783XXD Angioneurotic edema, subsequent encounter: Secondary | ICD-10-CM | POA: Diagnosis not present

## 2018-11-30 DIAGNOSIS — L500 Allergic urticaria: Secondary | ICD-10-CM | POA: Diagnosis not present

## 2018-11-30 MED ORDER — MONTELUKAST SODIUM 10 MG PO TABS: 10.0000 mg | ORAL_TABLET | Freq: Every day | ORAL | 5 refills | Status: DC

## 2018-11-30 MED ORDER — FAMOTIDINE 20 MG PO TABS: 20.0000 mg | ORAL_TABLET | Freq: Two times a day (BID) | ORAL | 5 refills | Status: DC

## 2018-11-30 NOTE — Patient Instructions (Addendum)
  1.  Allergen avoidance measures?  2.  Treat and prevent inflammation:   A.  Cetirizine 10 mg - 1-2 tablets 1-2 times per day (max = 40 mg)  B.  Famotidine 20 mg - 1 tablet 2 times per day  C.  Montelukast 10 mg - 1 tablet 1 time per day  3.  Can add OTC Benadryl if needed  4.  Blood - TSH, T4, TP, alpha gal panel  5.  Further evaluation?  Yes, if uncontrolled hives  6.  Return to clinic in 4 weeks or earlier if problem  7.  Plan for fall flu vaccine (and COVID vaccine)  8.  Discuss with primary care doctor about obtaining bone density scan

## 2018-11-30 NOTE — Progress Notes (Signed)
[place]   Dear Leeroy Cha,  Thank you for referring Sarah Flores to the [facility] on 11/30/2018.   Below is a summation of this patient's evaluation and recommendations.  Thank you for your referral. I will keep you informed about this patient's response to treatment.   If you have any questions please do not hesitate to contact me.   Sincerely,  Jiles Prows, MD Allergy / Immunology [place]   ______________________________________________________________________    NEW PATIENT NOTE  Referring Provider: Bess Harvest* Primary Provider: Mayer Camel, NP Date of office visit: 11/30/2018    Subjective:   Chief Complaint:  Sarah Flores (DOB: 18-Apr-1945) is a 73 y.o. female who presents to the clinic on 11/30/2018 with a chief complaint of Urticaria and Pruritus .     HPI: Sarah Flores presents to this clinic in evaluation of hives.  Apparently I had seen her in this clinic over a decade ago for a similar issue with hives and possibly some allergies.  She was doing fine without any significant atopic disease for many years.  She states that she has had a stroke and she her memory is not very good and she cannot really remember how long ago it was that she actually had hives but it is over a decade.    About 3 weeks ago she developed red raised itchy lesions across her body that never healed with scar or hyperpigmentation and were not associated with any systemic or constitutional symptoms and without any obvious trigger.  She was treated with a Kenalog injection and given prednisone but she only took 2 doses of prednisone because it dramatically increased her blood sugar and then she went back to see her primary care doctor and received another injection of Kenalog.  She has had left eye swelling in  association with her hives but no other swelling episodes.  She does not really take any antihistamines.  Concerning possible trigger, she cannot identify a specific etiologic agent that may be responsible for this issue.  She does not have any symptoms of an infectious disease, does not take any supplements, has not had a change in her medications in 2020, has not had a significant environmental change.  She did have a fracture of her foot about 7 weeks ago and she is taking acetaminophen occasionally.  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 Percell Miller in [place]   Palpitations    a. event monitor 07/2013 did not show significant arrhythmia - mostly NSR 70-100, with occasional S Tachy ~100-115, rare PACs & PVCs - not recorded as symptomatic.   ST elevation myocardial infarction (STEMI) of lateral wall ([place])    a. Post-op STEMI 02/2013 with cardiogenic shock, cath revealing clean cors and LV dysfunction c/w Takasubo Syndrome.   Stroke [facility])    a. CT head at  [place] [facility] 03/2014: No acute intracranial abnormality, probable small old cortical infarct in L posterior parietal region.   Syncope    a. Reported prior near-syncope in 2015 with suggestion of atrial fibrillation but no documentation to support this. An event monitor 07/2013 did not show significant arrhythmia - mostly NSR 70-100, with occasional S Tachy ~100-115, rare PACs & PVCs - not recorded as symptomatic. b. Syncope 03/2014 felt vasovagal.   Takotsubo cardiomyopathy     Past Surgical History:    Procedure Laterality Date   ABDOMINAL HYSTERECTOMY  [id number]   APPENDECTOMY  02/1966   BACK SURGERY     CARDIAC CATHETERIZATION  1990 X2   CARDIAC CATHETERIZATION  02/22/2013   Nonischemic; no notable CAD. EF 40%.   CHOLECYSTECTOMY  ~2001   COLONOSCOPY W/ BIOPSIES     LARYNGOSCOPY     laryngoscopy micro suspension with left Cymetra injection for dysphonia 09/29/09 [facility])   LEFT HEART CATHETERIZATION WITH CORONARY ANGIOGRAM N/A 02/22/2013   Procedure: LEFT HEART CATHETERIZATION WITH CORONARY ANGIOGRAM;  Surgeon: Sanda Klein, MD;  Location: [facility] CATH LAB;  Service: Cardiovascular; angiographically normal coronary arteries. LV dysfunction consistent with Takotsubo Cardiomyopathy/Syndrome    MAXIMUM ACCESS (MAS)POSTERIOR LUMBAR INTERBODY FUSION (PLIF) 1 LEVEL N/A 02/18/2013   Procedure: LUMBAR FIVE TO SACRAL ONE MAXIMUM ACCESS (MAS) POSTERIOR LUMBAR INTERBODY FUSION (PLIF) 1 LEVEL;  Surgeon: Eustace Moore, MD;  Location: [facility] NEURO ORS;  Service: Neurosurgery;  Laterality: N/A;   PARATHYROIDECTOMY  08/07/2009   [facility]   PLANTAR'S WART EXCISION Bilateral 1960's; 1980   TONSILLECTOMY  1950's   TRANSTHORACIC ECHOCARDIOGRAM  02/18/2013   EF 35-30% with anterolateral, lateral and inferolateral/apical hypokinesis (Takotsubo)    TRANSTHORACIC ECHOCARDIOGRAM  02/25/2013   EF 55%. No clear regional wall motion abnormalities. Grade 3 diastolic dysfunction. Mild MR   TRANSTHORACIC ECHOCARDIOGRAM  December 2015; January 2016   a. Normal LV size and function. EF 55-60%. Gr1 DD;; b. Normal LV size function with EF 60-65%. No RWM/A no comment on valve lesions or abnormalities. No comment on diastolic function.   TUBAL LIGATION  1979    Allergies as of 11/30/2018      Reactions   Penicillins Anaphylaxis   Benzoin    Burning - "looked like a 2nd degree burn"      Medication List    aspirin EC 81 MG tablet Take 81 mg by mouth daily.   atorvastatin 10 MG tablet Commonly known  as: LIPITOR Take 10 mg by mouth daily.   ferrous sulfate 325 (65 FE) MG tablet Take 325 mg by mouth every other day.   gabapentin 100 MG capsule Commonly known as: NEURONTIN TAKE 1 CAPSULE BY MOUTH THREE TIMES A DAY   glipiZIDE 10 MG 24 hr tablet Commonly known as: GLUCOTROL XL Take 10 mg by mouth 2 (two) times daily.   losartan 100 MG tablet Commonly known as: COZAAR Take 100 mg by mouth daily.   metFORMIN 500 MG 24 hr tablet Commonly known as: GLUCOPHAGE-XR Take 1,000 mg by mouth 2 (two) times a day.   metoprolol succinate 100 MG 24 hr tablet Commonly known as: TOPROL-XL TAKE 1 TABLET DAILY (TAKE WITH OR IMMEDIATELY FOLLOWING A MEAL)   metoprolol tartrate 25 MG tablet Commonly known as: LOPRESSOR Take 25 mg by mouth as directed.   nitroGLYCERIN 0.4 MG SL tablet Commonly known as: NITROSTAT Place 1 tablet (0.4 mg total) under the tongue every 5 (five) minutes as needed for chest pain.   OneTouch Verio test strip  Generic drug: glucose blood Inject 1 strip as directed 2 (two) times daily. Use 1 strip to check glucose 1-2 times a day   traMADol 50 MG tablet Commonly known as: ULTRAM Take 1 tablet by mouth every 8 (eight) hours. Take 1 tab every 8 hours as needed for pain   Trulicity A999333 0000000 Sopn Generic drug: Dulaglutide Inject into the skin once a week.       Review of systems negative except as noted in HPI / PMHx or noted below:  Review of Systems  Constitutional: Negative.   HENT: Negative.   Eyes: Negative.   Respiratory: Negative.   Cardiovascular: Negative.   Gastrointestinal: Negative.   Genitourinary: Negative.   Musculoskeletal: Negative.   Skin: Negative.   Neurological: Negative.   Endo/Heme/Allergies: Negative.   Psychiatric/Behavioral: Negative.     Family History  Problem Relation Age of Onset   Cancer Mother 70   Heart attack Mother 57   Heart attack Father 57   Cancer Father 60       Leukemia   Heart disease Brother      Heart attack Maternal Grandfather    Diabetes Paternal Grandmother    Heart attack Paternal Grandfather     Social History   Socioeconomic History   Marital status: Married    Spouse name: Not on file   Number of children: Not on file   Years of education: Not on file   Highest education level: Not on file  Occupational History   Occupation: Retired Proofreader strain: Not on file   Food insecurity    Worry: Not on file    Inability: Not on Lexicographer needs    Medical: Not on file    Non-medical: Not on file  Tobacco Use   Smoking status: Never Smoker   Smokeless tobacco: Never Used  Substance and Sexual Activity   Alcohol use: No   Drug use: No   Sexual activity: Not Currently  Lifestyle   Physical activity    Days per week: Not on file    Minutes per session: Not on file   Stress: Not on file  Relationships   Social connections    Talks on phone: Not on file    Gets together: Not on file    Attends religious service: Not on file    Active member of club or organization: Not on file    Attends meetings of clubs or organizations: Not on file    Relationship status: Not on file   Intimate partner violence    Fear of current or ex partner: Not on file    Emotionally abused: Not on file    Physically abused: Not on file    Forced sexual activity: Not on file  Other Topics Concern   Not on file  Social History Narrative   Lives with husband in [place], [place]. She is a married mother of 2 with 2 grandchildren. She has been married since 4. She is retired Network engineer having completed high school. She does not drink alcohol never smoked. She has not been exercising recently, but wants to get back to walking on a treadmill about 3 days a week.     Environmental and Social history  Lives in a house with a dry environment, no animals located inside the household, carpet in the bedroom, plastic on the  bed, plastic on the pillow, no smoking ongoing with inside the household.  Objective:  Vitals:   11/30/18 0924  BP: (!) 144/98  Pulse: (!) 56  Resp: 18  Temp: 97.8 F (36.6 C)  SpO2: 96%   Height: 5\' 2"  (157.5 cm) Weight: 153 lb (69.4 kg)  Physical Exam Constitutional:      Appearance: She is not diaphoretic.  HENT:     Head: Normocephalic. No right periorbital erythema or left periorbital erythema.     Right Ear: Tympanic membrane, ear canal and external ear normal.     Left Ear: Tympanic membrane, ear canal and external ear normal.     Nose: Nose normal. No mucosal edema or rhinorrhea.     Mouth/Throat:     Pharynx: No oropharyngeal exudate.  Eyes:     General: Lids are normal.     Conjunctiva/sclera: Conjunctivae normal.     Pupils: Pupils are equal, round, and reactive to light.  Neck:     Thyroid: No thyromegaly.     Trachea: Trachea normal. No tracheal deviation.  Cardiovascular:     Rate and Rhythm: Normal rate and regular rhythm.     Heart sounds: Normal heart sounds, S1 normal and S2 normal. No murmur.  Pulmonary:     Effort: Pulmonary effort is normal. No respiratory distress.     Breath sounds: No stridor. No wheezing or rales.  Chest:     Chest wall: No tenderness.  Abdominal:     General: There is no distension.     Palpations: Abdomen is soft. There is no mass.     Tenderness: There is no abdominal tenderness. There is no guarding or rebound.  Musculoskeletal:        General: No tenderness.  Lymphadenopathy:     Head:     Right side of head: No tonsillar adenopathy.     Left side of head: No tonsillar adenopathy.     Cervical: No cervical adenopathy.  Skin:    Coloration: Skin is not pale.     Findings: Rash (Multiple blanching erythematous macules involving trunk, extremities, face approximately 1 cm diameter) present. No erythema.     Nails: There is no clubbing.   Neurological:     Mental Status: She is alert.     Diagnostics: Allergy  skin tests were performed.  She did not demonstrate any hypersensitivity against a screening panel of foods.  Results of blood tests obtained 28 October 2018 identifies creatinine 0.79 mg/DL, AST 20 1U/L, ALT 16 U/L, WBC 8.5, absolute eosinophil 400, absolute lymphocyte 3100, hemoglobin 13.8, platelet 292  Results of a chest x-ray obtained 09 October 2017 identified the following:  The heart size and mediastinal contours are within normal limits. Both lungs are clear. The visualized skeletal structures are Unremarkable.  Assessment and Plan:    1. Allergic urticaria   2. Angioedema, subsequent encounter     1.  Allergen avoidance measures?  2.  Treat and prevent inflammation:   A.  Cetirizine 10 mg - 1-2 tablets 1-2 times per day (max = 40 mg)  B.  Famotidine 20 mg - 1 tablet 2 times per day  C.  Montelukast 10 mg - 1 tablet 1 time per day  3.  Can add OTC Benadryl if needed  4.  Blood - TSH, T4, TP, alpha gal panel  5.  Further evaluation?  Yes, if uncontrolled hives  6.  Return to clinic in 4 weeks or earlier if problem  7.  Plan for fall flu vaccine (and COVID vaccine)  8.  Discuss with primary  care doctor about obtaining bone density scan  Sarah Flores has some form of immune activation with unknown etiologic factor.  We will check a few blood tests as noted above looking for a condition giving rise to this immunological hyperactivity.  She will utilize a collection of agents directed against immune activation as noted above.  She will keep in contact with me noting her response to this approach.  If she does well I will see her back in this clinic in 4 weeks or earlier if there is a problem.  Jiles Prows, MD Allergy / Immunology [facility]

## 2018-12-01 ENCOUNTER — Encounter: Payer: Self-pay | Admitting: Allergy and Immunology

## 2018-12-01 LAB — TSH+FREE T4
Free T4: 1.34 ng/dL (ref 0.82–1.77)
TSH: 4.16 u[IU]/mL (ref 0.450–4.500)

## 2018-12-05 LAB — ALPHA-GAL PANEL
Alpha Gal IgE*: <0.1 kU/L (ref <0.10)
Beef (Bos spp) IgE: <0.1 kU/L (ref <0.35)
Class Interpretation: 0
Class Interpretation: 0
Class Interpretation: 0
Lamb/Mutton (Ovis spp) IgE: <0.1 kU/L (ref <0.35)
Pork (Sus spp) IgE: <0.1 kU/L (ref <0.35)

## 2018-12-05 LAB — THYROID PEROXIDASE ANTIBODY: Thyroperoxidase Ab SerPl-aCnc: <9 IU/mL (ref 0–34)

## 2018-12-30 ENCOUNTER — Encounter: Payer: Self-pay | Admitting: Allergy and Immunology

## 2018-12-30 ENCOUNTER — Ambulatory Visit (INDEPENDENT_AMBULATORY_CARE_PROVIDER_SITE_OTHER): Payer: Medicare Other | Admitting: Allergy and Immunology

## 2018-12-30 ENCOUNTER — Other Ambulatory Visit: Payer: Self-pay

## 2018-12-30 VITALS — BP 132/82 | HR 56 | Temp 97.6°F | Resp 16

## 2018-12-30 DIAGNOSIS — T783XXD Angioneurotic edema, subsequent encounter: Secondary | ICD-10-CM

## 2018-12-30 DIAGNOSIS — I201 Angina pectoris with documented spasm: Secondary | ICD-10-CM

## 2018-12-30 DIAGNOSIS — L500 Allergic urticaria: Secondary | ICD-10-CM

## 2018-12-30 NOTE — Patient Instructions (Signed)
  1.  Continue to Treat and prevent inflammation:   A.  Cetirizine 10 mg - 1-2 tablets 1-2 times per day (max = 40 mg)  B.  Famotidine 20 mg - 1 tablet 2 times per day  C.  Montelukast 10 mg - 1 tablet 1 time per day  2.  Can add OTC Benadryl if needed  3. Return to clinic in 8 weeks or earlier if problem. Taper medications?  4.  Plan for fall flu vaccine (and COVID vaccine)  5.  Discuss with primary care doctor about obtaining bone density scan

## 2018-12-30 NOTE — Progress Notes (Signed)
[facility]   Follow-up Note  Referring Provider: Raina Mina., MD Primary Provider: Mayer Camel, NP Date of Office Visit: 12/30/2018  Subjective:   Sarah Flores (DOB: [birth year]) is a 73 y.o. female who [facility] in re-evaluation of the following:  HPI: Jalexis returns to this clinic in evaluation of urticaria and angioedema assessed during her initial evaluation of 30 November 2018 at which point in time we addressed this issue.  She is much better at this point in time and does not have any hives.  She has been using her cetirizine at 20 mg daily along with her famotidine and montelukast.  She did visit with her eye doctor and apparently she has a retinal hemorrhage and she is going to have a repeat appointment and evaluation in 2 months for this issue.  She was given Pataday but she is not really sure why she was given Pataday and she has not really noticed any improvement regarding any issue when using this eyedrop.  She did not have her bone density scan.  Allergies as of 12/30/2018      Reactions   Penicillins Anaphylaxis   Benzoin    Burning - "looked like a 2nd degree burn"      Medication List      aspirin EC 81 MG tablet Take 81 mg by mouth daily.   atorvastatin 10 MG tablet Commonly known as: LIPITOR Take 10 mg by mouth daily.   famotidine 20 MG tablet Commonly known as: PEPCID Take 1 tablet (20 mg total) by mouth 2 (two) times daily.   ferrous sulfate 325 (65 FE) MG tablet Take 325 mg by mouth every other day.   gabapentin 100 MG capsule Commonly known as: NEURONTIN TAKE 1 CAPSULE BY MOUTH THREE TIMES A DAY   glipiZIDE 10 MG 24 hr tablet Commonly known as: GLUCOTROL XL Take 10 mg by mouth 2 (two) times daily.   losartan 100 MG tablet Commonly known as: COZAAR Take 100 mg by mouth daily.   metFORMIN 500 MG 24 hr tablet Commonly  known as: GLUCOPHAGE-XR Take 1,000 mg by mouth 2 (two) times a day.   metoprolol succinate 100 MG 24 hr tablet Commonly known as: TOPROL-XL TAKE 1 TABLET DAILY (TAKE WITH OR IMMEDIATELY FOLLOWING A MEAL)   metoprolol tartrate 25 MG tablet Commonly known as: LOPRESSOR Take 25 mg by mouth as directed.   montelukast 10 MG tablet Commonly known as: SINGULAIR Take 1 tablet (10 mg total) by mouth at bedtime.   nitroGLYCERIN 0.4 MG SL tablet Commonly known as: NITROSTAT Place 1 tablet (0.4 mg total) under the tongue every 5 (five) minutes as needed for chest pain.   OneTouch Verio test strip Generic drug: glucose blood Inject 1 strip as directed 2 (two) times daily. Use 1 strip to check glucose 1-2 times a day   Trulicity A999333 0000000 Sopn Generic drug: Dulaglutide Inject into the skin once a week.       Past Medical History:  Diagnosis Date  . Arthritis   . Cardiogenic shock ([place])   . CKD (chronic kidney disease), stage II   . 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 Diabetes mellitus ([place])   . DVT (deep venous thrombosis) ([place]) 03/1991   "right arm; from IV I had when I had my hysterectomy; on Coumadin for awhile" (02/18/2013)  .  Dyslipidemia, goal LDL below 70   . Fibromyalgia   . Hypertension   . NASH (nonalcoholic steatohepatitis)    Dr. Percell Miller in [place]  . Palpitations    a. event monitor 07/2013 did not show significant arrhythmia - mostly NSR 70-100, with occasional S Tachy ~100-115, rare PACs & PVCs - not recorded as symptomatic.  . ST elevation myocardial infarction (STEMI) of lateral wall ([place])    a. Post-op STEMI 02/2013 with cardiogenic shock, cath revealing clean cors and LV dysfunction c/w Takasubo Syndrome.  . Stroke [facility])    a. CT head at [facility] 03/2014: No acute intracranial abnormality, probable small old cortical infarct in L posterior parietal region.  . Syncope    a. Reported prior near-syncope in 2015 with  suggestion of atrial fibrillation but no documentation to support this. An event monitor 07/2013 did not show significant arrhythmia - mostly NSR 70-100, with occasional S Tachy ~100-115, rare PACs & PVCs - not recorded as symptomatic. b. Syncope 03/2014 felt vasovagal.  . Takotsubo cardiomyopathy     Past Surgical History:  Procedure Laterality Date  . ABDOMINAL HYSTERECTOMY  [id number]  . APPENDECTOMY  02/1966  . BACK SURGERY    . CARDIAC CATHETERIZATION  1990 X2  . CARDIAC CATHETERIZATION  02/22/2013   Nonischemic; no notable CAD. EF 40%.  . CHOLECYSTECTOMY  ~2001  . COLONOSCOPY W/ BIOPSIES    . LARYNGOSCOPY     laryngoscopy micro suspension with left Cymetra injection for dysphonia 09/29/09 [facility])  . LEFT HEART CATHETERIZATION WITH CORONARY ANGIOGRAM N/A 02/22/2013   Procedure: LEFT HEART CATHETERIZATION WITH CORONARY ANGIOGRAM;  Surgeon: Sanda Klein, MD;  Location: [facility] CATH LAB;  Service: Cardiovascular; angiographically normal coronary arteries. LV dysfunction consistent with Takotsubo Cardiomyopathy/Syndrome   . MAXIMUM ACCESS (MAS)POSTERIOR LUMBAR INTERBODY FUSION (PLIF) 1 LEVEL N/A 02/18/2013   Procedure: LUMBAR FIVE TO SACRAL ONE MAXIMUM ACCESS (MAS) POSTERIOR LUMBAR INTERBODY FUSION (PLIF) 1 LEVEL;  Surgeon: Eustace Moore, MD;  Location: [facility] NEURO ORS;  Service: Neurosurgery;  Laterality: N/A;  . PARATHYROIDECTOMY  08/07/2009   [place] Bilateral 1960's; 1980  . TONSILLECTOMY  1950's  . TRANSTHORACIC ECHOCARDIOGRAM  02/18/2013   EF 35-30% with anterolateral, lateral and inferolateral/apical hypokinesis (Takotsubo)   . TRANSTHORACIC ECHOCARDIOGRAM  02/25/2013   EF 55%. No clear regional wall motion abnormalities. Grade 3 diastolic dysfunction. Mild MR  . TRANSTHORACIC ECHOCARDIOGRAM  December 2015; January 2016   a. Normal LV size and function. EF 55-60%. Gr1 DD;; b. Normal LV size function with EF 60-65%. No RWM/A no comment on valve lesions or  abnormalities. No comment on diastolic function.  . TUBAL LIGATION  1979    Review of systems negative except as noted in HPI / PMHx or noted below:  Review of Systems  Constitutional: Negative.   HENT: Negative.   Eyes: Negative.   Respiratory: Negative.   Cardiovascular: Negative.   Gastrointestinal: Negative.   Genitourinary: Negative.   Musculoskeletal: Negative.   Skin: Negative.   Neurological: Negative.   Endo/Heme/Allergies: Negative.   Psychiatric/Behavioral: Negative.      Objective:   Vitals:   12/30/18 0838  BP: 132/82  Pulse: (!) 56  Resp: 16  Temp: 97.6 F (36.4 C)  SpO2: 98%          Physical Exam Skin:    Findings: No rash (no urticaria).     Diagnostics:   Results of blood tests obtained 30 November 2018 identified TSH 4.160 IU/mL, free T4  1.34 NG/DL, thyroid peroxidase antibody 9U/mL, negative alpha gal panel.  Assessment and Plan:   1. Allergic urticaria   2. Angioedema, subsequent encounter     1.  Continue to Treat and prevent inflammation:   A.  Cetirizine 10 mg - 1-2 tablets 1-2 times per day (max = 40 mg)  B.  Famotidine 20 mg - 1 tablet 2 times per day  C.  Montelukast 10 mg - 1 tablet 1 time per day  2.  Can add OTC Benadryl if needed  3. Return to clinic in 8 weeks or earlier if problem. Taper medications?  4.  Plan for fall flu vaccine (and COVID vaccine)  5.  Discuss with primary care doctor about obtaining bone density scan  Zelva appears to be doing better at this point in time on a large collection of medical therapy as noted above.  I would like for her to remain on this treatment for least 12 weeks and she has already completed 4 weeks of this theatment. I will see her back in this clinic in 8 weeks.  At that point we will probably taper her medications assuming that the immunological process giving rise to her hyperreactivity has burnt out.  I did encourage her to follow-up with her primary care doctor about the  bone density scan given her recent foot fracture.  Allena Katz, MD Allergy / Immunology [place]

## 2018-12-31 ENCOUNTER — Encounter: Payer: Self-pay | Admitting: Allergy and Immunology

## 2019-03-03 ENCOUNTER — Ambulatory Visit: Payer: Medicare Other | Admitting: Allergy and Immunology

## 2019-03-11 ENCOUNTER — Other Ambulatory Visit: Payer: Self-pay

## 2019-03-11 ENCOUNTER — Ambulatory Visit (INDEPENDENT_AMBULATORY_CARE_PROVIDER_SITE_OTHER): Payer: Medicare Other | Admitting: Allergy and Immunology

## 2019-03-11 ENCOUNTER — Encounter: Payer: Self-pay | Admitting: Allergy and Immunology

## 2019-03-11 VITALS — BP 148/90 | HR 68 | Temp 97.1°F | Resp 16

## 2019-03-11 DIAGNOSIS — J3089 Other allergic rhinitis: Secondary | ICD-10-CM

## 2019-03-11 DIAGNOSIS — T783XXD Angioneurotic edema, subsequent encounter: Secondary | ICD-10-CM

## 2019-03-11 DIAGNOSIS — L500 Allergic urticaria: Secondary | ICD-10-CM

## 2019-03-11 NOTE — Patient Instructions (Addendum)
  1.  Continue to Treat and prevent inflammation:   A.  Cetirizine 10 mg - 1-2 tablets 1-2 times per day (max = 40 mg)  B.  DECREASE Famotidine 20 mg - 1 tablet 1 time per day  C.  Montelukast 10 mg - 1 tablet 1 time per day  2.  Can add OTC Benadryl if needed  3. START OTC Nasacort - 1 spray each nostril 3-7 times per week during periods of upper airway symptoms. Takes days to work.   4. Return to clinic in 6 months or earlier if problem  5. Obtain COVID vaccine

## 2019-03-11 NOTE — Progress Notes (Signed)
[place]   Follow-up Note  Referring Provider: Bess Harvest* Primary Provider: Mayer Camel, NP Date of Office Visit: 03/11/2019  Subjective:   Sarah Flores (DOB: [birth year]) is a 74 y.o. female who returns to the [facility] on 03/11/2019 in re-evaluation of the following:  HPI: Amneet returns to this clinic in evaluation of urticaria and angioedema.  Her last visit to this clinic was 30 December 2018.  She had a 24-hour period where she developed 3 hives on her forehead along with sneezing and nasal congestion after outdoor exposure but otherwise has had no activity of her skin and feels as though she is doing very well without any swelling episodes or itchy spots while using her cetirizine at 10 mg daily and her famotidine at 20 mg twice a day and montelukast 10 mg once a day.  She does relate a history of having lots of sneezing when she goes outdoors in general.  She does not use any nasal steroids.  She did have the flu vaccine.  She is debating whether to receive the Covid vaccine.  Allergies as of 03/11/2019      Reactions   Penicillins Anaphylaxis   Benzoin    Burning - "looked like a 2nd degree burn"      Medication List    aspirin EC 81 MG tablet Take 81 mg by mouth daily.   atorvastatin 10 MG tablet Commonly known as: LIPITOR Take 10 mg by mouth daily.   famotidine 20 MG tablet Commonly known as: PEPCID Take 1 tablet (20 mg total) by mouth 2 (two) times daily.   gabapentin 100 MG capsule Commonly known as: NEURONTIN TAKE 1 CAPSULE BY MOUTH THREE TIMES A DAY   glipiZIDE 10 MG 24 hr tablet Commonly known as: GLUCOTROL XL Take 10 mg by mouth 2 (two) times daily.   losartan 100 MG tablet Commonly known as: COZAAR Take 100 mg by mouth daily.   metFORMIN 500 MG 24 hr tablet Commonly known as: GLUCOPHAGE-XR Take 1,000 mg by mouth 2 (two) times a day.   metoprolol  succinate 100 MG 24 hr tablet Commonly known as: TOPROL-XL TAKE 1 TABLET DAILY (TAKE WITH OR IMMEDIATELY FOLLOWING A MEAL)   metoprolol tartrate 25 MG tablet Commonly known as: LOPRESSOR Take 25 mg by mouth as directed.   montelukast 10 MG tablet Commonly known as: SINGULAIR Take 1 tablet (10 mg total) by mouth at bedtime.   nitroGLYCERIN 0.4 MG SL tablet Commonly known as: NITROSTAT Place 1 tablet (0.4 mg total) under the tongue every 5 (five) minutes as needed for chest pain.   OneTouch Verio test strip Generic drug: glucose blood Inject 1 strip as directed 2 (two) times daily. Use 1 strip to check glucose 1-2 times a day   Trulicity A999333 0000000 Sopn Generic drug: Dulaglutide Inject into the skin once a week.       Past Medical History:  Diagnosis Date  . Arthritis   . Cardiogenic shock ([place])   . CKD (chronic kidney disease), stage II   . 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 Diabetes mellitus ([place])   . DVT (deep venous thrombosis) ([place]) 03/1991   "right arm; from IV I had when I had my hysterectomy; on Coumadin for awhile" (02/18/2013)  . Dyslipidemia, goal LDL below 70   . Fibromyalgia   . Hypertension   . NASH (nonalcoholic  steatohepatitis)    Dr. Percell Miller in [place]  . Palpitations    a. event monitor 07/2013 did not show significant arrhythmia - mostly NSR 70-100, with occasional S Tachy ~100-115, rare PACs & PVCs - not recorded as symptomatic.  . ST elevation myocardial infarction (STEMI) of lateral wall ([place])    a. Post-op STEMI 02/2013 with cardiogenic shock, cath revealing clean cors and LV dysfunction c/w Takasubo Syndrome.  . Stroke [facility])    a. CT [facility] 03/2014: No acute intracranial abnormality, probable small old cortical infarct in L posterior parietal region.  . Syncope    a. Reported prior near-syncope in 2015 with suggestion of atrial fibrillation but no documentation to support this. An event monitor  07/2013 did not show significant arrhythmia - mostly NSR 70-100, with occasional S Tachy ~100-115, rare PACs & PVCs - not recorded as symptomatic. b. Syncope 03/2014 felt vasovagal.  . Takotsubo cardiomyopathy     Past Surgical History:  Procedure Laterality Date  . ABDOMINAL HYSTERECTOMY  [id number]  . APPENDECTOMY  02/1966  . BACK SURGERY    . CARDIAC CATHETERIZATION  1990 X2  . CARDIAC CATHETERIZATION  02/22/2013   Nonischemic; no notable CAD. EF 40%.  . CHOLECYSTECTOMY  ~2001  . COLONOSCOPY W/ BIOPSIES    . LARYNGOSCOPY     laryngoscopy micro suspension with left Cymetra injection for dysphonia 09/29/09 [facility])  . LEFT HEART CATHETERIZATION WITH CORONARY ANGIOGRAM N/A 02/22/2013   Procedure: LEFT HEART CATHETERIZATION WITH CORONARY ANGIOGRAM;  Surgeon: Sanda Klein, MD;  Location: [place] CATH LAB;  Service: Cardiovascular; angiographically normal coronary arteries. LV dysfunction consistent with Takotsubo Cardiomyopathy/Syndrome   . MAXIMUM ACCESS (MAS)POSTERIOR LUMBAR INTERBODY FUSION (PLIF) 1 LEVEL N/A 02/18/2013   Procedure: LUMBAR FIVE TO SACRAL ONE MAXIMUM ACCESS (MAS) POSTERIOR LUMBAR INTERBODY FUSION (PLIF) 1 LEVEL;  Surgeon: Eustace Moore, MD;  Location: [place] NEURO ORS;  Service: Neurosurgery;  Laterality: N/A;  . PARATHYROIDECTOMY  08/07/2009   [place] Bilateral 1960's; 1980  . TONSILLECTOMY  1950's  . TRANSTHORACIC ECHOCARDIOGRAM  02/18/2013   EF 35-30% with anterolateral, lateral and inferolateral/apical hypokinesis (Takotsubo)   . TRANSTHORACIC ECHOCARDIOGRAM  02/25/2013   EF 55%. No clear regional wall motion abnormalities. Grade 3 diastolic dysfunction. Mild MR  . TRANSTHORACIC ECHOCARDIOGRAM  December 2015; January 2016   a. Normal LV size and function. EF 55-60%. Gr1 DD;; b. Normal LV size function with EF 60-65%. No RWM/A no comment on valve lesions or abnormalities. No comment on diastolic function.  . TUBAL LIGATION  1979    Review of  systems negative except as noted in HPI / PMHx or noted below:  Review of Systems  Constitutional: Negative.   HENT: Negative.   Eyes: Negative.   Respiratory: Negative.   Cardiovascular: Negative.   Gastrointestinal: Negative.   Genitourinary: Negative.   Musculoskeletal: Negative.   Skin: Negative.   Neurological: Negative.   Endo/Heme/Allergies: Negative.   Psychiatric/Behavioral: Negative.      Objective:   Vitals:   03/11/19 1102  BP: (!) 148/90  Pulse: 68  Resp: 16  Temp: (!) 97.1 F (36.2 C)  SpO2: 95%          Physical Exam Constitutional:      Appearance: She is not diaphoretic.  HENT:     Head: Normocephalic.     Right Ear: Tympanic membrane, ear canal and external ear normal.     Left Ear: Tympanic membrane, ear canal and external ear normal.  Nose: Nose normal. No mucosal edema or rhinorrhea.     Mouth/Throat:     Pharynx: Uvula midline. No oropharyngeal exudate.  Eyes:     Conjunctiva/sclera: Conjunctivae normal.  Neck:     Thyroid: No thyromegaly.     Trachea: Trachea normal. No tracheal tenderness or tracheal deviation.  Cardiovascular:     Rate and Rhythm: Normal rate and regular rhythm.     Heart sounds: Normal heart sounds, S1 normal and S2 normal. No murmur.  Pulmonary:     Effort: No respiratory distress.     Breath sounds: Normal breath sounds. No stridor. No wheezing or rales.  Lymphadenopathy:     Head:     Right side of head: No tonsillar adenopathy.     Left side of head: No tonsillar adenopathy.     Cervical: No cervical adenopathy.  Skin:    Findings: No erythema or rash.     Nails: There is no clubbing.  Neurological:     Mental Status: She is alert.     Diagnostics: none  Assessment and Plan:   1. Allergic urticaria   2. Angioedema, subsequent encounter   3. Other allergic rhinitis     1.  Continue to Treat and prevent inflammation:   A.  Cetirizine 10 mg - 1-2 tablets 1-2 times per day (max = 40 mg)  B.   DECREASE Famotidine 20 mg - 1 tablet 1 time per day  C.  Montelukast 10 mg - 1 tablet 1 time per day  2.  Can add OTC Benadryl if needed  3. START OTC Nasacort - 1 spray each nostril 3-7 times per week during periods of upper airway symptoms. Takes days to work.   4. Return to clinic in 6 months or earlier if problem  5. Obtain COVID vaccine  Artina appears to be doing quite well on her current plan and we will see if we can consolidate some of her treatment by decreasing her famotidine.  As well, she appears to have some issues with allergic rhinitis especially with outdoor exposure and we will start her on a nasal steroid utilize preventatively.  Assuming she does well with this plan I will see her back in this clinic in 6 months or earlier if there is a problem.  Allena Katz, MD Allergy / Immunology [place]

## 2019-03-22 ENCOUNTER — Encounter: Payer: Self-pay | Admitting: Allergy and Immunology

## 2019-03-29 ENCOUNTER — Other Ambulatory Visit: Payer: Self-pay | Admitting: Allergy and Immunology

## 2019-04-07 ENCOUNTER — Ambulatory Visit (INDEPENDENT_AMBULATORY_CARE_PROVIDER_SITE_OTHER): Payer: Medicare Other | Admitting: Allergy and Immunology

## 2019-04-07 ENCOUNTER — Encounter: Payer: Self-pay | Admitting: Allergy and Immunology

## 2019-04-07 ENCOUNTER — Other Ambulatory Visit: Payer: Self-pay

## 2019-04-07 VITALS — BP 162/70 | HR 68 | Temp 98.1°F | Resp 18

## 2019-04-07 DIAGNOSIS — L500 Allergic urticaria: Secondary | ICD-10-CM

## 2019-04-07 DIAGNOSIS — T783XXD Angioneurotic edema, subsequent encounter: Secondary | ICD-10-CM | POA: Diagnosis not present

## 2019-04-07 DIAGNOSIS — J3089 Other allergic rhinitis: Secondary | ICD-10-CM | POA: Diagnosis not present

## 2019-04-07 NOTE — Patient Instructions (Signed)
  1.  Continue to Treat and prevent inflammation:   A.  Cetirizine 10 mg - 1-2 tablets 2 times per day (max = 40 mg)  B.  Famotidine 20 mg - 1 tablet 2 time per day  C.  Montelukast 10 mg - 1 tablet 1 time per day  2.  Can add OTC Benadryl if needed  3. Continue OTC Nasacort - 1 spray each nostril 3-7 times per week    4. Do not rub eye. Can use warm or cold compress.  5. Return to clinic in 6 months or earlier if problem  6. Obtain COVID vaccine

## 2019-04-07 NOTE — Progress Notes (Signed)
[place]   Follow-up Note  Referring Provider: Bess Harvest* Primary Provider: Mayer Camel, NP Date of Office Visit: 04/07/2019  Subjective:   Sarah Flores (DOB: Apr 19, 1945) is a 74 y.o. female who returns to the [facility] on 04/07/2019 in re-evaluation of the following:  HPI: Sarah Flores returns to this clinic in evaluation of her urticaria and angioedema.  Her last visit to this clinic was 11 March 2019.  As she taper down some of her medications she noticed that she developed problems with an intermittent hives here and there and today developed a little bit of right eye swelling which she rubbed intensely which obviously made the swell a little bit more.  There is no obvious provoking factor for her recurrent skin activity.  She has no associated systemic or constitutional symptoms.  She did start a nasal steroid during her last visit and this is really helped her nose significantly.  Allergies as of 04/07/2019      Reactions   Penicillins Anaphylaxis   Benzoin    Burning - "looked like a 2nd degree burn"      Medication List      aspirin EC 81 MG tablet Take 81 mg by mouth daily.   atorvastatin 10 MG tablet Commonly known as: LIPITOR Take 10 mg by mouth daily.   famotidine 20 MG tablet Commonly known as: PEPCID TAKE 1 TABLET BY MOUTH TWICE A DAY   gabapentin 100 MG capsule Commonly known as: NEURONTIN TAKE 1 CAPSULE BY MOUTH THREE TIMES A DAY   glipiZIDE 10 MG 24 hr tablet Commonly known as: GLUCOTROL XL Take 10 mg by mouth 2 (two) times daily.   losartan 100 MG tablet Commonly known as: COZAAR Take 100 mg by mouth daily.   metFORMIN 500 MG 24 hr tablet Commonly known as: GLUCOPHAGE-XR Take 1,000 mg by mouth 2 (two) times a day.   metoprolol succinate 100 MG 24 hr tablet Commonly known as: TOPROL-XL TAKE 1 TABLET DAILY (TAKE WITH OR IMMEDIATELY FOLLOWING A  MEAL)   metoprolol tartrate 25 MG tablet Commonly known as: LOPRESSOR Take 25 mg by mouth as directed.   montelukast 10 MG tablet Commonly known as: SINGULAIR TAKE 1 TABLET BY MOUTH EVERYDAY AT BEDTIME   nitroGLYCERIN 0.4 MG SL tablet Commonly known as: NITROSTAT Place 1 tablet (0.4 mg total) under the tongue every 5 (five) minutes as needed for chest pain.   OneTouch Verio test strip Generic drug: glucose blood Inject 1 strip as directed 2 (two) times daily. Use 1 strip to check glucose 1-2 times a day   Trulicity A999333 0000000 Sopn Generic drug: Dulaglutide Inject into the skin once a week.       Past Medical History:  Diagnosis Date  . Arthritis   . Cardiogenic shock ([place])   . CKD (chronic kidney disease), stage II   . 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 Diabetes mellitus ([place])   . DVT (deep venous thrombosis) ([place]) 03/1991   "right arm; from IV I had when I had my hysterectomy; on Coumadin for awhile" (02/18/2013)  . Dyslipidemia, goal LDL below 70   . Fibromyalgia   . Hypertension   . NASH (nonalcoholic steatohepatitis)    Dr. Percell Miller in [place]  . Palpitations    a. event monitor 07/2013 did not show significant arrhythmia - mostly NSR 70-100, with occasional S Tachy ~100-115,  rare PACs & PVCs - not recorded as symptomatic.  . ST elevation myocardial infarction (STEMI) of lateral wall ([place])    a. Post-op STEMI 02/2013 with cardiogenic shock, cath revealing clean cors and LV dysfunction c/w Takasubo Syndrome.  . Stroke [facility])    a. CT head at [facility] 03/2014: No acute intracranial abnormality, probable small old cortical infarct in L posterior parietal region.  . Syncope    a. Reported prior near-syncope in 2015 with suggestion of atrial fibrillation but no documentation to support this. An event monitor 07/2013 did not show significant arrhythmia - mostly NSR 70-100, with occasional S Tachy ~100-115, rare PACs & PVCs - not  recorded as symptomatic. b. Syncope 03/2014 felt vasovagal.  . Takotsubo cardiomyopathy     Past Surgical History:  Procedure Laterality Date  . ABDOMINAL HYSTERECTOMY  [id number]  . APPENDECTOMY  02/1966  . BACK SURGERY    . CARDIAC CATHETERIZATION  1990 X2  . CARDIAC CATHETERIZATION  02/22/2013   Nonischemic; no notable CAD. EF 40%.  . CHOLECYSTECTOMY  ~2001  . COLONOSCOPY W/ BIOPSIES    . LARYNGOSCOPY     laryngoscopy micro suspension with left Cymetra injection for dysphonia 09/29/09 [facility])  . LEFT HEART CATHETERIZATION WITH CORONARY ANGIOGRAM N/A 02/22/2013   Procedure: LEFT HEART CATHETERIZATION WITH CORONARY ANGIOGRAM;  Surgeon: Sanda Klein, MD;  Location: [place] CATH LAB;  Service: Cardiovascular; angiographically normal coronary arteries. LV dysfunction consistent with Takotsubo Cardiomyopathy/Syndrome   . MAXIMUM ACCESS (MAS)POSTERIOR LUMBAR INTERBODY FUSION (PLIF) 1 LEVEL N/A 02/18/2013   Procedure: LUMBAR FIVE TO SACRAL ONE MAXIMUM ACCESS (MAS) POSTERIOR LUMBAR INTERBODY FUSION (PLIF) 1 LEVEL;  Surgeon: Eustace Moore, MD;  Location: [place] NEURO ORS;  Service: Neurosurgery;  Laterality: N/A;  . PARATHYROIDECTOMY  08/07/2009   [place] Bilateral 1960's; 1980  . TONSILLECTOMY  1950's  . TRANSTHORACIC ECHOCARDIOGRAM  02/18/2013   EF 35-30% with anterolateral, lateral and inferolateral/apical hypokinesis (Takotsubo)   . TRANSTHORACIC ECHOCARDIOGRAM  02/25/2013   EF 55%. No clear regional wall motion abnormalities. Grade 3 diastolic dysfunction. Mild MR  . TRANSTHORACIC ECHOCARDIOGRAM  December 2015; January 2016   a. Normal LV size and function. EF 55-60%. Gr1 DD;; b. Normal LV size function with EF 60-65%. No RWM/A no comment on valve lesions or abnormalities. No comment on diastolic function.  . TUBAL LIGATION  1979    Review of systems negative except as noted in HPI / PMHx or noted below:  Review of Systems  Constitutional: Negative.   HENT:  Negative.   Eyes: Negative.   Respiratory: Negative.   Cardiovascular: Negative.   Gastrointestinal: Negative.   Genitourinary: Negative.   Musculoskeletal: Negative.   Skin: Negative.   Neurological: Negative.   Endo/Heme/Allergies: Negative.   Psychiatric/Behavioral: Negative.      Objective:   Vitals:   04/07/19 1700  BP: (!) 162/70  Pulse: 68  Resp: 18  Temp: 98.1 F (36.7 C)  SpO2: 94%          Physical Exam Constitutional:      Appearance: She is not diaphoretic.  HENT:     Head: Normocephalic.     Right Ear: Tympanic membrane, ear canal and external ear normal.     Left Ear: Tympanic membrane, ear canal and external ear normal.     Nose: Nose normal. No mucosal edema or rhinorrhea.     Mouth/Throat:     Pharynx: Uvula midline. No oropharyngeal exudate.  Eyes:  Conjunctiva/sclera: Conjunctivae normal.  Neck:     Thyroid: No thyromegaly.     Trachea: Trachea normal. No tracheal tenderness or tracheal deviation.  Cardiovascular:     Rate and Rhythm: Normal rate and regular rhythm.     Heart sounds: Normal heart sounds, S1 normal and S2 normal. No murmur.  Pulmonary:     Effort: No respiratory distress.     Breath sounds: Normal breath sounds. No stridor. No wheezing or rales.  Lymphadenopathy:     Head:     Right side of head: No tonsillar adenopathy.     Left side of head: No tonsillar adenopathy.     Cervical: No cervical adenopathy.  Skin:    Findings: Rash (Right supraorbital erythema with slight swelling) present. No erythema.     Nails: There is no clubbing.  Neurological:     Mental Status: She is alert.     Diagnostics: none   Assessment and Plan:   1. Allergic urticaria   2. Angioedema, subsequent encounter   3. Other allergic rhinitis     1.  Continue to Treat and prevent inflammation:   A.  Cetirizine 10 mg - 1-2 tablets 2 times per day (max = 40 mg)  B.  Famotidine 20 mg - 1 tablet 2 time per day  C.  Montelukast 10 mg - 1  tablet 1 time per day  2.  Can add OTC Benadryl if needed  3. Continue OTC Nasacort - 1 spray each nostril 3-7 times per week    4. Do not rub eye. Can use warm or cold compress.  5. Return to clinic in 6 months or earlier if problem  6. Obtain COVID vaccine  We will have Faten consistently use cetirizine and famotidine and montelukast as noted above for what appears to be some degree of immunological hyperreactivity manifested as recurrent episodes of urticaria and angioedema and she will continue to utilize therapy directed against her allergic rhinitis.  Assuming she does well with this plan I will see her back in this clinic in 6 months.  If she has continued activity in the face of this therapy she will contact me for further evaluation and treatment.  Allena Katz, MD Allergy / Immunology [place]

## 2019-04-08 ENCOUNTER — Encounter: Payer: Self-pay | Admitting: Allergy and Immunology

## 2019-05-20 ENCOUNTER — Encounter: Payer: Self-pay | Admitting: General Practice

## 2019-09-09 ENCOUNTER — Other Ambulatory Visit: Payer: Self-pay

## 2019-09-09 ENCOUNTER — Encounter: Payer: Self-pay | Admitting: Allergy and Immunology

## 2019-09-09 ENCOUNTER — Ambulatory Visit (INDEPENDENT_AMBULATORY_CARE_PROVIDER_SITE_OTHER): Payer: Medicare Other | Admitting: Allergy and Immunology

## 2019-09-09 VITALS — BP 150/70 | HR 64 | Resp 18 | Ht 62.0 in | Wt 151.4 lb

## 2019-09-09 DIAGNOSIS — T783XXD Angioneurotic edema, subsequent encounter: Secondary | ICD-10-CM | POA: Diagnosis not present

## 2019-09-09 DIAGNOSIS — J3089 Other allergic rhinitis: Secondary | ICD-10-CM | POA: Diagnosis not present

## 2019-09-09 DIAGNOSIS — L500 Allergic urticaria: Secondary | ICD-10-CM

## 2019-09-09 NOTE — Progress Notes (Signed)
[name]   Follow-up Note  Referring Provider: Bess Harvest* Primary Provider: Mayer Camel, NP Date of Office Visit: 09/09/2019  Subjective:   Sarah Flores (DOB: January 06, 1946) is a 74 y.o. female who returns to the [facility] on 09/09/2019 in re-evaluation of the following:  HPI: [name] returns to this clinic in reevaluation of her urticaria and angioedema.  Her last visit to this clinic was 07 April 2019.  [name] has really done very well since her last visit with only one episode of urticaria with some slight left periorbital swelling that she treated with an additional dose of cetirizine.  For the most part she has not had any skin activity while using cetirizine 10 mg, famotidine 20 mg, and montelukast 10 mg daily.  She has had no problems with her nose and she no longer requires any nasal steroid.  She has received 2 Moderna Covid vaccinations.  She has had an increase in her metoprolol dose for her tachycardia prescribed by her cardiologist.  Allergies as of 09/09/2019      Reactions   Penicillins Anaphylaxis   Benzoin    Burning - "looked like a 2nd degree burn"      Medication List      aspirin EC 81 MG tablet Take 81 mg by mouth daily.   atorvastatin 10 MG tablet Commonly known as: LIPITOR Take 10 mg by mouth daily.   famotidine 20 MG tablet Commonly known as: PEPCID TAKE 1 TABLET BY MOUTH TWICE A DAY   gabapentin 100 MG capsule Commonly known as: NEURONTIN TAKE 1 CAPSULE BY MOUTH THREE TIMES A DAY   glipiZIDE 10 MG 24 hr tablet Commonly known as: GLUCOTROL XL Take 10 mg by mouth 2 (two) times daily.   losartan 100 MG tablet Commonly known as: COZAAR Take 100 mg by mouth daily.   metFORMIN 500 MG 24 hr tablet Commonly known as: GLUCOPHAGE-XR Take 1,000 mg by mouth 2 (two) times a day.   metoprolol succinate 100 MG 24 hr tablet Commonly known as:  TOPROL-XL TAKE 1 TABLET DAILY (TAKE WITH OR IMMEDIATELY FOLLOWING A MEAL)   metoprolol tartrate 25 MG tablet Commonly known as: LOPRESSOR Take 25 mg by mouth as directed.   montelukast 10 MG tablet Commonly known as: SINGULAIR TAKE 1 TABLET BY MOUTH EVERYDAY AT BEDTIME   nitroGLYCERIN 0.4 MG SL tablet Commonly known as: NITROSTAT Place 1 tablet (0.4 mg total) under the tongue every 5 (five) minutes as needed for chest pain.   OneTouch Verio test strip Generic drug: glucose blood Inject 1 strip as directed 2 (two) times daily. Use 1 strip to check glucose 1-2 times a day   Trulicity 4.40 NU/2.7OZ Sopn Generic drug: Dulaglutide Inject into the skin once a week.       Past Medical History:  Diagnosis Date  . Arthritis   . Cardiogenic shock ([place])   . CKD (chronic kidney disease), stage II   . 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 Diabetes mellitus ([place])   . DVT (deep venous thrombosis) ([place]) 03/1991   "right arm; from IV I had when I had my hysterectomy; on Coumadin for awhile" (02/18/2013)  . Dyslipidemia, goal LDL below 70   . Fibromyalgia   . Hypertension   . NASH (nonalcoholic steatohepatitis)    Dr. Percell Miller in [place]  . Palpitations    a. event monitor 07/2013  did not show significant arrhythmia - mostly NSR 70-100, with occasional S Tachy ~100-115, rare PACs & PVCs - not recorded as symptomatic.  . ST elevation myocardial infarction (STEMI) of lateral wall ([place])    a. Post-op STEMI 02/2013 with cardiogenic shock, cath revealing clean cors and LV dysfunction c/w Takasubo Syndrome.  . Stroke [facility])    a. CT head at [facility] 03/2014: No acute intracranial abnormality, probable small old cortical infarct in L posterior parietal region.  . Syncope    a. Reported prior near-syncope in 2015 with suggestion of atrial fibrillation but no documentation to support this. An event monitor 07/2013 did not show significant arrhythmia - mostly NSR  70-100, with occasional S Tachy ~100-115, rare PACs & PVCs - not recorded as symptomatic. b. Syncope 03/2014 felt vasovagal.  . Takotsubo cardiomyopathy     Past Surgical History:  Procedure Laterality Date  . ABDOMINAL HYSTERECTOMY  [id number]  . APPENDECTOMY  02/1966  . BACK SURGERY    . CARDIAC CATHETERIZATION  1990 X2  . CARDIAC CATHETERIZATION  02/22/2013   Nonischemic; no notable CAD. EF 40%.  . CHOLECYSTECTOMY  ~2001  . COLONOSCOPY W/ BIOPSIES    . LARYNGOSCOPY     laryngoscopy micro suspension with left Cymetra injection for dysphonia 09/29/09 [facility])  . LEFT HEART CATHETERIZATION WITH CORONARY ANGIOGRAM N/A 02/22/2013   Procedure: LEFT HEART CATHETERIZATION WITH CORONARY ANGIOGRAM;  Surgeon: Sanda Klein, MD;  Location: [facility] CATH LAB;  Service: Cardiovascular; angiographically normal coronary arteries. LV dysfunction consistent with Takotsubo Cardiomyopathy/Syndrome   . MAXIMUM ACCESS (MAS)POSTERIOR LUMBAR INTERBODY FUSION (PLIF) 1 LEVEL N/A 02/18/2013   Procedure: LUMBAR FIVE TO SACRAL ONE MAXIMUM ACCESS (MAS) POSTERIOR LUMBAR INTERBODY FUSION (PLIF) 1 LEVEL;  Surgeon: Eustace Moore, MD;  Location: [place] NEURO ORS;  Service: Neurosurgery;  Laterality: N/A;  . PARATHYROIDECTOMY  08/07/2009   [place] Bilateral 1960's; 1980  . TONSILLECTOMY  1950's  . TRANSTHORACIC ECHOCARDIOGRAM  02/18/2013   EF 35-30% with anterolateral, lateral and inferolateral/apical hypokinesis (Takotsubo)   . TRANSTHORACIC ECHOCARDIOGRAM  02/25/2013   EF 55%. No clear regional wall motion abnormalities. Grade 3 diastolic dysfunction. Mild MR  . TRANSTHORACIC ECHOCARDIOGRAM  December 2015; January 2016   a. Normal LV size and function. EF 55-60%. Gr1 DD;; b. Normal LV size function with EF 60-65%. No RWM/A no comment on valve lesions or abnormalities. No comment on diastolic function.  . TUBAL LIGATION  1979    Review of systems negative except as noted in HPI / PMHx or noted  below:  Review of Systems  Constitutional: Negative.   HENT: Negative.   Eyes: Negative.   Respiratory: Negative.   Cardiovascular: Negative.   Gastrointestinal: Negative.   Genitourinary: Negative.   Musculoskeletal: Negative.   Skin: Negative.   Neurological: Negative.   Endo/Heme/Allergies: Negative.   Psychiatric/Behavioral: Negative.      Objective:   Vitals:   09/09/19 0845  BP: (!) 150/70  Pulse: 64  Resp: 18  SpO2: 96%   Height: 5\' 2"  (157.5 cm)  Weight: 151 lb 6.4 oz (68.7 kg)   Physical Exam Constitutional:      Appearance: She is not diaphoretic.  HENT:     Head: Normocephalic.     Right Ear: Tympanic membrane, ear canal and external ear normal.     Left Ear: Tympanic membrane, ear canal and external ear normal.     Nose: Nose normal. No mucosal edema or rhinorrhea.     Mouth/Throat:  Pharynx: Uvula midline. No oropharyngeal exudate.  Eyes:     Conjunctiva/sclera: Conjunctivae normal.  Neck:     Thyroid: No thyromegaly.     Trachea: Trachea normal. No tracheal tenderness or tracheal deviation.  Cardiovascular:     Rate and Rhythm: Normal rate and regular rhythm.     Heart sounds: Normal heart sounds, S1 normal and S2 normal. No murmur heard.   Pulmonary:     Effort: No respiratory distress.     Breath sounds: Normal breath sounds. No stridor. No wheezing or rales.  Lymphadenopathy:     Head:     Right side of head: No tonsillar adenopathy.     Left side of head: No tonsillar adenopathy.     Cervical: No cervical adenopathy.  Skin:    Findings: No erythema or rash.     Nails: There is no clubbing.  Neurological:     Mental Status: She is alert.     Diagnostics: none  Assessment and Plan:   1. Allergic urticaria   2. Angioedema, subsequent encounter   3. Other allergic rhinitis     1.  Continue to Treat and prevent inflammation:   A.  Cetirizine 10 mg - 1-2 tablets 2 times per day (max = 40 mg)  B.  Famotidine 20 mg - 1 tablet 1  time per day  C.  Montelukast 10 mg - 1 tablet 1 time per day  2.  Can add OTC Benadryl if needed  3. Continue OTC Nasacort - 1 spray each nostril 3-7 times per week during periods of upper airway symptoms  4. Return to clinic in 12 months or earlier if problem  5. Obtain fall flu vaccine  Cherl appears to be doing very well on her current therapy which includes 10 mg of cetirizine, 10 mg of montelukast, and 20 mg of famotidine per day.  She will continue on this plan I will see her back in this clinic next year or earlier if there is a problem.  She has the option of restarting her nasal steroid should she develop upper airway symptoms as she moves forward.  Allena Katz, MD Allergy / Immunology [place]

## 2019-09-09 NOTE — Patient Instructions (Addendum)
  1.  Continue to Treat and prevent inflammation:   A.  Cetirizine 10 mg - 1-2 tablets 2 times per day (max = 40 mg)  B.  Famotidine 20 mg - 1 tablet 1 time per day  C.  Montelukast 10 mg - 1 tablet 1 time per day  2.  Can add OTC Benadryl if needed  3. Continue OTC Nasacort - 1 spray each nostril 3-7 times per week during periods of upper airway symptoms  4. Return to clinic in 12 months or earlier if problem  5. Obtain fall flu vaccine

## 2019-09-13 ENCOUNTER — Encounter: Payer: Self-pay | Admitting: Allergy and Immunology

## 2019-10-01 ENCOUNTER — Other Ambulatory Visit: Payer: Self-pay | Admitting: Allergy and Immunology

## 2019-10-07 ENCOUNTER — Telehealth: Payer: Self-pay | Admitting: Radiation Oncology

## 2019-10-07 NOTE — Telephone Encounter (Signed)
Opened in error

## 2019-11-11 ENCOUNTER — Other Ambulatory Visit: Payer: Self-pay | Admitting: Allergy and Immunology

## 2020-02-02 ENCOUNTER — Other Ambulatory Visit: Payer: Self-pay | Admitting: Allergy and Immunology

## 2020-03-13 ENCOUNTER — Ambulatory Visit (HOSPITAL_COMMUNITY): Payer: Medicare Other

## 2020-03-13 ENCOUNTER — Other Ambulatory Visit: Payer: Self-pay | Admitting: Physician Assistant

## 2020-03-13 NOTE — Progress Notes (Signed)
Called to discuss with patient about COVID-19 symptoms and the use of one of the available treatments for those with mild to moderate Covid symptoms and at a high risk of hospitalization.  Pt appears to qualify for outpatient treatment due to co-morbid conditions and/or a member of an at-risk group in accordance with the FDA Emergency Use Authorization.    Symptom onset: 1/9 Vaccinated: yes Booster? Yes,  Qualifiers: age, HTN, DM, CAD, BMI 27  Pt also feeling sick. She has not been tested but husband is covid +. Interested in oral therapy but need updated labs: kidney function normal in 11/2019 but on several nephrotoxic medications and has a history of AKI. Will get a BMET stat when she comes in with her husband tomorrow to be treated with Mab therapy.   Angelena Form

## 2020-03-14 ENCOUNTER — Telehealth (HOSPITAL_COMMUNITY): Payer: Self-pay

## 2020-03-14 ENCOUNTER — Ambulatory Visit (HOSPITAL_COMMUNITY)
Admission: RE | Admit: 2020-03-14 | Discharge: 2020-03-14 | Disposition: A | Discharge status: Home / Self Care | Payer: Medicare Other | Source: Ambulatory Visit | Attending: Pulmonary Disease | Admitting: Pulmonary Disease

## 2020-03-14 ENCOUNTER — Other Ambulatory Visit: Payer: Self-pay | Admitting: Family

## 2020-03-14 DIAGNOSIS — U071 COVID-19: Secondary | ICD-10-CM | POA: Insufficient documentation

## 2020-03-14 DIAGNOSIS — N182 Chronic kidney disease, stage 2 (mild): Secondary | ICD-10-CM | POA: Insufficient documentation

## 2020-03-14 LAB — BASIC METABOLIC PANEL
Anion gap: 13 (ref 5–15)
BUN: 7 mg/dL — ABNORMAL LOW (ref 8–23)
CO2: 25 mmol/L (ref 22–32)
Calcium: 7.6 mg/dL — ABNORMAL LOW (ref 8.9–10.3)
Chloride: 98 mmol/L (ref 98–111)
Creatinine, Ser: 0.81 mg/dL (ref 0.44–1.00)
GFR, Estimated: >60 mL/min (ref >60)
Glucose, Bld: 200 mg/dL — ABNORMAL HIGH (ref 70–99)
Potassium: 4 mmol/L (ref 3.5–5.1)
Sodium: 136 mmol/L (ref 135–145)

## 2020-03-14 MED ORDER — NIRMATRELVIR/RITONAVIR (PAXLOVID)TABLET: 3.0000 | ORAL_TABLET | Freq: Two times a day (BID) | ORAL | 0 refills | Status: AC

## 2020-03-14 MED FILL — PAXLOVID 20 X 150 MG & 10 X: 20 X 150 MG | 5 days supply | Qty: 30 | Fill #0

## 2020-03-14 NOTE — Progress Notes (Signed)
Outpatient Oral COVID Treatment Note  I connected with Sarah Flores on 03/14/2020/1:48 PM by telephone and verified that I am speaking with the correct person using two identifiers.  I discussed the limitations, risks, security, and privacy concerns of performing an evaluation and management service by telephone and the availability of in person appointments. I also discussed with the patient that there may be a patient responsible charge related to this service. The patient expressed understanding and agreed to proceed.  Patient location: [place] Provider location: Home  Diagnosis: COVID-19 infection  Purpose of visit: Discussion of potential use of Molnupiravir or Paxlovid, a new treatment for mild to moderate COVID-19 viral infection in non-hospitalized patients.   Subjective: Patient is a 75 y.o. female who has been diagnosed with COVID 19 viral infection.  Their symptoms began on 03/12/20 with cough and congestion.    Past Medical History:  Diagnosis Date  . Arthritis   . Cardiogenic shock ([place])   . CKD (chronic kidney disease), stage II   . 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 Diabetes mellitus ([place])   . DVT (deep venous thrombosis) ([place]) 03/1991   "right arm; from IV I had when I had my hysterectomy; on Coumadin for awhile" (02/18/2013)  . Dyslipidemia, goal LDL below 70   . Fibromyalgia   . Hypertension   . NASH (nonalcoholic steatohepatitis)    Dr. Percell Miller in [place]  . Palpitations    a. event monitor 07/2013 did not show significant arrhythmia - mostly NSR 70-100, with occasional S Tachy ~100-115, rare PACs & PVCs - not recorded as symptomatic.  . ST elevation myocardial infarction (STEMI) of lateral wall ([place])    a. Post-op STEMI 02/2013 with cardiogenic shock, cath revealing clean cors and LV dysfunction c/w Takasubo Syndrome.  . Stroke [facility])    a. CT head at [facility] 03/2014: No acute intracranial abnormality, probable small old  cortical infarct in L posterior parietal region.  . Syncope    a. Reported prior near-syncope in 2015 with suggestion of atrial fibrillation but no documentation to support this. An event monitor 07/2013 did not show significant arrhythmia - mostly NSR 70-100, with occasional S Tachy ~100-115, rare PACs & PVCs - not recorded as symptomatic. b. Syncope 03/2014 felt vasovagal.  . Takotsubo cardiomyopathy     Allergies  Allergen Reactions  . Penicillins Anaphylaxis  . Benzoin     Burning - "looked like a 2nd degree burn"     Current Outpatient Medications:  .  aspirin EC 81 MG tablet, Take 81 mg by mouth daily., Disp: , Rfl:  .  atorvastatin (LIPITOR) 10 MG tablet, Take 10 mg by mouth daily., Disp: , Rfl:  .  Dulaglutide (TRULICITY) 8.34 HD/6.2IW SOPN, Inject into the skin once a week., Disp: , Rfl:  .  famotidine (PEPCID) 20 MG tablet, TAKE 1 TABLET BY MOUTH TWICE A DAY, Disp: 180 tablet, Rfl: 1 .  gabapentin (NEURONTIN) 100 MG capsule, TAKE 1 CAPSULE BY MOUTH THREE TIMES A DAY, Disp: , Rfl:  .  glipiZIDE (GLUCOTROL XL) 10 MG 24 hr tablet, Take 10 mg by mouth 2 (two) times daily., Disp: , Rfl:  .  losartan (COZAAR) 100 MG tablet, Take 100 mg by mouth daily., Disp: , Rfl:  .  metFORMIN (GLUCOPHAGE-XR) 500 MG 24 hr tablet, Take 1,000 mg by mouth 2 (two) times a day., Disp: , Rfl:  .  metoprolol succinate (TOPROL-XL) 100 MG 24 hr tablet,  TAKE 1 TABLET DAILY (TAKE WITH OR IMMEDIATELY FOLLOWING A MEAL), Disp: 90 tablet, Rfl: 2 .  metoprolol tartrate (LOPRESSOR) 25 MG tablet, Take 25 mg by mouth as directed., Disp: , Rfl:  .  montelukast (SINGULAIR) 10 MG tablet, TAKE 1 TABLET BY MOUTH EVERYDAY AT BEDTIME, Disp: 90 tablet, Rfl: 0 .  nitroGLYCERIN (NITROSTAT) 0.4 MG SL tablet, Place 1 tablet (0.4 mg total) under the tongue every 5 (five) minutes as needed for chest pain., Disp: 25 tablet, Rfl: 6 .  ONETOUCH VERIO test strip, Inject 1 strip as directed 2 (two) times daily. Use 1 strip to check glucose  1-2 times a day, Disp: , Rfl: 0  Objective: Patient appears/sounds well.  They are in no apparent distress.  Breathing is non labored.  Mood and behavior are normal.  Laboratory Data:  Recent Results (from the past 2160 hour(s))  Basic metabolic panel     Status: Abnormal   Collection Time: 03/14/20 12:44 PM  Result Value Ref Range   Sodium 136 135 - 145 mmol/L   Potassium 4.0 3.5 - 5.1 mmol/L   Chloride 98 98 - 111 mmol/L   CO2 25 22 - 32 mmol/L   Glucose, Bld 200 (H) 70 - 99 mg/dL    Comment: Glucose reference range applies only to samples taken after fasting for at least 8 hours.   BUN 7 (L) 8 - 23 mg/dL   Creatinine, Ser 0.81 0.44 - 1.00 mg/dL   Calcium 7.6 (L) 8.9 - 10.3 mg/dL   GFR, Estimated >60 >60 mL/min    Comment: (NOTE) Calculated using the CKD-EPI Creatinine Equation (2021)    Anion gap 13 5 - 15    Comment: Performed at [facility], [place] [address], [place], [place] [address]     Assessment: 75 y.o. female with mild/moderate COVID 19 viral infection diagnosed on 1/9 at high risk for progression to severe COVID 19.  Plan:  This patient is a 75 y.o. female that meets the following criteria for Emergency Use Authorization of: Paxlovid 1. Age >12 yr AND > 40 kg 2. SARS-COV-2 positive test 3. Symptom onset < 5 days 4. Mild-to-moderate COVID disease with high risk for severe progression to hospitalization or death  I have spoken and communicated the following to the patient or parent/caregiver regarding: 1. Paxlovid is an unapproved drug that is authorized for use under an Emergency Use Authorization.  2. There are no adequate, approved, available products for the treatment of COVID-19 in adults who have mild-to-moderate COVID-19 and are at high risk for progressing to severe COVID-19, including hospitalization or death. 3. Other therapeutics are currently authorized. For additional information on all products authorized for treatment or prevention of  COVID-19, please see [employer].  4. There are benefits and risks of taking this treatment as outlined in the "Fact Sheet for Patients and Caregivers."  5. "Fact Sheet for Patients and Caregivers" was reviewed with patient. A hard copy will be provided to patient from pharmacy prior to the patient receiving treatment. 6. Patients should continue to self-isolate and use infection control measures (e.g., wear mask, isolate, social distance, avoid sharing personal items, clean and disinfect "high touch" surfaces, and frequent handwashing) according to CDC guidelines.  7. The patient or parent/caregiver has the option to accept or refuse treatment. 8. Patient medication history was reviewed for potential drug interactions:Interaction with home meds: atorvastatin which will be held 9. Patient's creatinine clearance was calculated to be 68, and they were therefore prescribed  Normal dose (CrCl>60) - nirmatrelvir 150mg  tab (2 tablet) by mouth twice daily AND ritonavir 100mg  tab (1 tablet) by mouth twice daily   After reviewing above information with the patient, the patient agrees to receive Paxlovid.  Follow up instructions:    . Take prescription BID x 5 days as directed . Reach out to pharmacist for counseling on medication if desired . For concerns regarding further COVID symptoms please follow up with your PCP or urgent care . For urgent or life-threatening issues, seek care at your local emergency department  The patient was provided an opportunity to ask questions, and all were answered. The patient agreed with the plan and demonstrated an understanding of the instructions.   Script sent to [facility] and opted to pick up RX.  The patient was advised to call their PCP or seek an in-person evaluation if the symptoms worsen or if the condition fails to improve as  anticipated.  CrCl 68  I provided 5 minutes of non face-to-face telephone visit time during this encounter, and > 50% was spent counseling as documented under my assessment & plan.  Mauricio Po, FNP 03/14/2020 /1:48 PM

## 2020-03-14 NOTE — Telephone Encounter (Signed)
Patient was prescribed oral covid treatment Paxlovid and treatment note was reviewed. Medication has been received by [place] and reviewed for appropriateness.  Drug Interactions or Dosage Adjustments Noted: Hold Atorvastatin.  Delivery Method: pick up  Patient contacted for counseling on 03/14/20 and verbalized understanding.   Delivery or Pick-Up Date: 03/14/20   Alinda Dooms 03/14/2020, 2:35 PM [facility] Outpatient Pharmacist Phone# [phone]

## 2020-05-02 ENCOUNTER — Other Ambulatory Visit: Payer: Self-pay | Admitting: Allergy and Immunology

## 2020-06-06 ENCOUNTER — Other Ambulatory Visit: Payer: Self-pay | Admitting: Allergy and Immunology

## 2020-08-31 ENCOUNTER — Other Ambulatory Visit: Payer: Self-pay | Admitting: Allergy and Immunology

## 2020-09-14 ENCOUNTER — Ambulatory Visit: Payer: Medicare Other | Admitting: Allergy and Immunology

## 2020-10-03 ENCOUNTER — Other Ambulatory Visit: Payer: Self-pay | Admitting: Allergy and Immunology

## 2020-10-11 ENCOUNTER — Other Ambulatory Visit: Payer: Self-pay

## 2020-10-11 ENCOUNTER — Encounter: Payer: Self-pay | Admitting: Allergy and Immunology

## 2020-10-11 ENCOUNTER — Ambulatory Visit (INDEPENDENT_AMBULATORY_CARE_PROVIDER_SITE_OTHER): Payer: Medicare Other | Admitting: Allergy and Immunology

## 2020-10-11 VITALS — BP 118/72 | HR 63 | Resp 16 | Ht 62.0 in | Wt 147.6 lb

## 2020-10-11 DIAGNOSIS — J3089 Other allergic rhinitis: Secondary | ICD-10-CM | POA: Diagnosis not present

## 2020-10-11 DIAGNOSIS — J310 Chronic rhinitis: Secondary | ICD-10-CM

## 2020-10-11 DIAGNOSIS — L500 Allergic urticaria: Secondary | ICD-10-CM

## 2020-10-11 DIAGNOSIS — T783XXD Angioneurotic edema, subsequent encounter: Secondary | ICD-10-CM

## 2020-10-11 MED ORDER — IPRATROPIUM BROMIDE 0.06 % NA SOLN: NASAL | 5 refills | Status: DC

## 2020-10-11 MED ORDER — FAMOTIDINE 20 MG PO TABS: 20.0000 mg | ORAL_TABLET | Freq: Every day | ORAL | 3 refills | Status: DC

## 2020-10-11 NOTE — Progress Notes (Signed)
[facility]   Follow-up Note  Referring Provider: Bess Harvest* Primary Provider: Mayer Camel, NP Date of Office Visit: 10/11/2020  Subjective:   Sarah Flores (DOB: 18-Dec-1945) is a 75 y.o. female who returns to the [facility] on 10/11/2020 in re-evaluation of the following:  HPI: Sarah Flores returns to this clinic in evaluation of her urticaria and angioedema and allergic rhinitis.  Her last visit to this clinic was 09 September 2019.  While continuing on a plan of an H1 and H2 receptor blocker and a leukotriene modifier she has not had any episodes or urticaria and angioedema.  She still does have lots of sneezing even in the face of utilizing these medications.  She does not use a nasal steroid on a regular basis.  She also appears to have lots of rhinorrhea on a chronic basis especially when she eats.  She has received 2 [place] vaccinations and has been infected with COVID on 2 occasions having received a monoclonal antibody with each 1 of those infections.  Allergies as of 10/11/2020       Reactions   Penicillins Anaphylaxis   Benzoin    Burning - "looked like a 2nd degree burn"        Medication List    aspirin EC 81 MG tablet Take 81 mg by mouth daily.   atorvastatin 10 MG tablet Commonly known as: LIPITOR Take 10 mg by mouth daily.   Dulaglutide 0.75 MG/0.5ML Sopn Inject into the skin once a week.   famotidine 20 MG tablet Commonly known as: PEPCID Take 1 tablet (20 mg total) by mouth daily in the afternoon.   gabapentin 100 MG capsule Commonly known as: NEURONTIN TAKE 1 CAPSULE BY MOUTH THREE TIMES A DAY   glipiZIDE 10 MG 24 hr tablet Commonly known as: GLUCOTROL XL Take 10 mg by mouth 2 (two) times daily.   ipratropium 0.06 % nasal spray Commonly known as: ATROVENT 1-2 sprays in each nostril every 6 hours to dry up nose Started by: Stevon Gough Kevan Rosebush, MD    losartan 100 MG tablet Commonly known as: COZAAR Take 100 mg by mouth daily.   metFORMIN 500 MG 24 hr tablet Commonly known as: GLUCOPHAGE-XR Take 1,000 mg by mouth 2 (two) times a day.   metoprolol succinate 100 MG 24 hr tablet Commonly known as: TOPROL-XL TAKE 1 TABLET DAILY (TAKE WITH OR IMMEDIATELY FOLLOWING A MEAL)   metoprolol tartrate 25 MG tablet Commonly known as: LOPRESSOR Take 25 mg by mouth as directed.   montelukast 10 MG tablet Commonly known as: SINGULAIR TAKE 1 TABLET BY MOUTH EVERYDAY AT BEDTIME   nitroGLYCERIN 0.4 MG SL tablet Commonly known as: NITROSTAT Place 1 tablet (0.4 mg total) under the tongue every 5 (five) minutes as needed for chest pain.   OneTouch Verio test strip Generic drug: glucose blood Inject 1 strip as directed 2 (two) times daily. Use 1 strip to check glucose 1-2 times a day   Paxlovid 20 x 150 MG & 10 x '100MG'$  Tbpk Generic drug: Nirmatrelvir & Ritonavir TAKE 3 TABLETS BY MOUTH 2 TIMES DAILY FOR 5 DAYS.        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 Percell Miller in [place]   Palpitations    a. event monitor 07/2013 did not show significant arrhythmia - mostly NSR 70-100, with occasional S Tachy ~100-115, rare PACs & PVCs - not recorded as symptomatic.   ST elevation myocardial infarction (STEMI) of lateral wall ([place])    a. Post-op STEMI 02/2013 with cardiogenic shock, cath revealing clean cors and LV dysfunction c/w Takasubo Syndrome.   Stroke [facility])    a. CT head [facility] 03/[facility]: No acute intracranial  abnormality, probable small old cortical infarct in L posterior parietal region.   Syncope    a. Reported prior near-syncope in 2015 with suggestion of atrial fibrillation but no documentation to support this. An event monitor 07/2013 did not show significant arrhythmia - mostly NSR 70-100, with occasional S Tachy ~100-115, rare PACs & PVCs - not recorded as symptomatic. b. Syncope 03/2014 felt vasovagal.   Takotsubo cardiomyopathy     Past Surgical History:  Procedure Laterality Date   ABDOMINAL HYSTERECTOMY  [id number]   APPENDECTOMY  02/1966   BACK SURGERY     CARDIAC CATHETERIZATION  1990 X2   CARDIAC CATHETERIZATION  02/22/2013   Nonischemic; no notable CAD. EF 40%.   CHOLECYSTECTOMY  ~2001   COLONOSCOPY W/ BIOPSIES     LARYNGOSCOPY     laryngoscopy micro suspension with left Cymetra injection for dysphonia 09/29/09 [facility])   LEFT HEART CATHETERIZATION WITH CORONARY ANGIOGRAM N/A 02/22/2013   Procedure: LEFT HEART CATHETERIZATION WITH CORONARY ANGIOGRAM;  Surgeon: Sanda Klein, MD;  Location: [facility] CATH LAB;  Service: Cardiovascular; angiographically normal coronary arteries. LV dysfunction consistent with Takotsubo Cardiomyopathy/Syndrome    MAXIMUM ACCESS (MAS)POSTERIOR LUMBAR INTERBODY FUSION (PLIF) 1 LEVEL N/A 02/18/2013   Procedure: LUMBAR FIVE TO SACRAL ONE MAXIMUM ACCESS (MAS) POSTERIOR LUMBAR INTERBODY FUSION (PLIF) 1 LEVEL;  Surgeon: Eustace Moore, MD;  Location: [facility] NEURO ORS;  Service: Neurosurgery;  Laterality: N/A;   PARATHYROIDECTOMY  08/07/2009   [facility]   PLANTAR'S WART EXCISION Bilateral 1960's; 1980   TONSILLECTOMY  1950's   TRANSTHORACIC ECHOCARDIOGRAM  02/18/2013   EF 35-30% with anterolateral, lateral and inferolateral/apical hypokinesis (Takotsubo)    TRANSTHORACIC ECHOCARDIOGRAM  02/25/2013   EF 55%. No clear regional wall motion abnormalities. Grade 3 diastolic dysfunction. Mild MR   TRANSTHORACIC ECHOCARDIOGRAM  December 2015; January 2016   a. Normal LV size  and function. EF 55-60%. Gr1 DD;; b. Normal LV size function with EF 60-65%. No RWM/A no comment on valve lesions or abnormalities. No comment on diastolic function.   TUBAL LIGATION  1979    Review of systems negative except as noted in HPI / PMHx or noted below:  Review of Systems  Constitutional: Negative.   HENT: Negative.    Eyes: Negative.   Respiratory: Negative.    Cardiovascular: Negative.   Gastrointestinal: Negative.   Genitourinary: Negative.   Musculoskeletal: Negative.   Skin: Negative.   Neurological: Negative.   Endo/Heme/Allergies: Negative.   Psychiatric/Behavioral: Negative.      Objective:   Vitals:   10/11/20 1036  BP: 118/72  Pulse: 63  Resp: 16  SpO2: 97%   Height: '5\' 2"'$  (157.5 cm)  Weight: 147 lb 9.6 oz (67 kg)   Physical Exam Constitutional:  Appearance: She is not diaphoretic.  HENT:     Head: Normocephalic.     Right Ear: Tympanic membrane, ear canal and external ear normal.     Left Ear: Tympanic membrane, ear canal and external ear normal.     Nose: Nose normal. No mucosal edema or rhinorrhea.     Mouth/Throat:     Pharynx: Uvula midline. No oropharyngeal exudate.  Eyes:     Conjunctiva/sclera: Conjunctivae normal.  Neck:     Thyroid: No thyromegaly.     Trachea: Trachea normal. No tracheal tenderness or tracheal deviation.  Cardiovascular:     Rate and Rhythm: Normal rate and regular rhythm.     Heart sounds: Normal heart sounds, S1 normal and S2 normal. No murmur heard. Pulmonary:     Effort: No respiratory distress.     Breath sounds: Normal breath sounds. No stridor. No wheezing or rales.  Lymphadenopathy:     Head:     Right side of head: No tonsillar adenopathy.     Left side of head: No tonsillar adenopathy.     Cervical: No cervical adenopathy.  Skin:    Findings: No erythema or rash.     Nails: There is no clubbing.  Neurological:     Mental Status: She is alert.    Diagnostics:   Assessment and Plan:   1.  Angioedema, subsequent encounter   2. Allergic urticaria   3. Other allergic rhinitis   4. Gustatory rhinitis     1.  Continue to Treat and prevent inflammation:   A.  Cetirizine 10 mg - 1-2 tablets 2 times per day (max = 40 mg)  B.  Famotidine 20 mg - 1 tablet 1 time per day  C.  Montelukast 10 mg - 1 tablet 1 time per day  D.  OTC Nasacort - 1 spray each nostril 3-7 times per week  2.  If needed:   A. Ipratropium 0.06% - 1-2 sprays each nostril every 6 hours TO DRY NOSE  3. Return to clinic in 12 months or earlier if problem  4. Obtain fall flu vaccine  Kimbella appears to be doing relatively well on her current plan which includes an H1 and H2 receptor blocker and a leukotriene modifier to control her overactive immune system.  She does appear to have a little bit more problems with inflammation of her upper airway and I encouraged her to use Nasacort in a preventative manner finding a dose that works for her without producing a side effect.  And, she does appear to have an issue that is associated with gustatory rhinitis and chronic rhinorrhea for which she can use nasal ipratropium.  Assuming she does well with this plan I will see her back in this clinic in 1 year or earlier if there is a problem.  Allena Katz, MD Allergy / Immunology [facility]

## 2020-10-11 NOTE — Patient Instructions (Signed)
  1.  Continue to Treat and prevent inflammation:   A.  Cetirizine 10 mg - 1-2 tablets 2 times per day (max = 40 mg)  B.  Famotidine 20 mg - 1 tablet 1 time per day  C.  Montelukast 10 mg - 1 tablet 1 time per day  D.  OTC Nasacort - 1 spray each nostril 3-7 times per week  2.  If needed:   A. Ipratropium 0.06% - 1-2 sprays each nostril every 6 hours TO DRY NOSE  3. Return to clinic in 12 months or earlier if problem  4. Obtain fall flu vaccine

## 2020-10-12 ENCOUNTER — Encounter: Payer: Self-pay | Admitting: Allergy and Immunology

## 2020-12-18 ENCOUNTER — Other Ambulatory Visit: Payer: Self-pay | Admitting: Allergy and Immunology

## 2021-05-31 ENCOUNTER — Encounter: Payer: Self-pay | Admitting: Internal Medicine

## 2021-06-01 ENCOUNTER — Telehealth: Payer: Self-pay | Admitting: Hematology and Oncology

## 2021-06-01 NOTE — Telephone Encounter (Signed)
Left message for patient to return call in reference to upcoming clinic appointment on 4/5 ?

## 2021-06-04 ENCOUNTER — Encounter: Payer: Self-pay | Admitting: *Deleted

## 2021-06-04 DIAGNOSIS — D0512 Intraductal carcinoma in situ of left breast: Secondary | ICD-10-CM

## 2021-06-04 NOTE — Progress Notes (Signed)
?Radiation Oncology         ([phone]) [phone] ?________________________________ ? ?Name: Sarah Flores        MRN: [id number]  ?Date of Service: 06/06/2021 DOB: Oct 21, 1945 ? ?[id number], [name], NP  Jovita Kussmaul, MD    ? ?REFERRING PHYSICIAN: Jovita Kussmaul, MD ? ? ?DIAGNOSIS: The encounter diagnosis was Ductal carcinoma in situ (DCIS) of left breast. ? ? ?HISTORY OF PRESENT ILLNESS: Sarah Flores is a 76 y.o. female seen in the multidisciplinary breast clinic for a new diagnosis of left breast cancer. The patient was noted to have screening detected group of calcifications in the lower inner quadrant of the left breast.  Further diagnostic imaging including an ultrasound showed a 7 mm irregular mass with spiculated margin at the 7 o'clock position, her axilla was negative for adenopathy.  A biopsy revealed low to intermediate grade DCIS that was ER/PR positive.  She is seen to discuss treatment recommendations of her cancer. ? ?PREVIOUS RADIATION THERAPY: No ? ? ?PAST MEDICAL HISTORY:  ?Past Medical History:  ?Diagnosis Date  ? Arthritis   ? Cardiogenic shock ([place])   ? CKD (chronic kidney disease), stage II   ? Complication of anesthesia   ? 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 Diabetes mellitus ([place])   ? DVT (deep venous thrombosis) ([place]) 03/1991  ? "right arm; from IV I had when I had my hysterectomy; on Coumadin for awhile" (02/18/2013)  ? Dyslipidemia, goal LDL below 70   ? Fibromyalgia   ? Hypertension   ? NASH (nonalcoholic steatohepatitis)   ? Dr. Percell Miller in [place]  ? Palpitations   ? a. event monitor 07/2013 did not show significant arrhythmia - mostly NSR 70-100, with occasional S Tachy ~100-115, rare PACs & PVCs - not recorded as symptomatic.  ? ST elevation myocardial infarction (STEMI) of lateral wall ([facility])   ? a. Post-op STEMI 02/2013 with cardiogenic shock, cath revealing clean cors and LV dysfunction c/w Takasubo Syndrome.  ? Stroke [facility])   ? a. CT head at [facility] 03/2014: No acute  intracranial abnormality, probable small old cortical infarct in L posterior parietal region.  ? Syncope   ? a. Reported prior near-syncope in 2015 with suggestion of atrial fibrillation but no documentation to support this. An event monitor 07/2013 did not show significant arrhythmia - mostly NSR 70-100, with occasional S Tachy ~100-115, rare PACs & PVCs - not recorded as symptomatic. b. Syncope 03/2014 felt vasovagal.  ? Takotsubo cardiomyopathy   ?   ? ? ?PAST SURGICAL HISTORY: ?Past Surgical History:  ?Procedure Laterality Date  ? ABDOMINAL HYSTERECTOMY  [id number]  ? APPENDECTOMY  02/1966  ? BACK SURGERY    ? [place] X2  ? CARDIAC CATHETERIZATION  02/22/2013  ? Nonischemic; no notable CAD. EF 40%.  ? CHOLECYSTECTOMY  ~2001  ? COLONOSCOPY W/ BIOPSIES    ? LARYNGOSCOPY    ? laryngoscopy micro suspension with left Cymetra injection for dysphonia 09/29/09 [facility])  ? LEFT HEART CATHETERIZATION WITH CORONARY ANGIOGRAM N/A 02/22/2013  ? Procedure: LEFT HEART CATHETERIZATION WITH CORONARY ANGIOGRAM;  Surgeon: Sanda Klein, MD;  Location: [facility] C[facility] LAB;  Service: Cardiovascular; angiographically normal coronary arteries. LV dysfunction consistent with Takotsubo Cardiomyopathy/Syndrome   ? MAXIMUM ACCESS (MAS)POSTERIOR LUMBAR INTERBODY FUSION (PLIF) 1 LEVEL N/A 02/18/2013  ? Procedure: LUMBAR FIVE TO SACRAL ONE MAXIMUM ACCESS (MAS) POSTERIOR LUMBAR INTERBODY FUSION (PLIF) 1 LEVEL;  Surgeon: Eustace Moore, MD;  Location: [facility] NEURO ORS;  Service: Neurosurgery;  Laterality: N/A;  ? PARATHYROIDECTOMY  08/07/2009  ? [facility]  ? PLANTAR'S WART EXCISION Bilateral 1960's; 1980  ? TONSILLECTOMY  1950's  ? TRANSTHORACIC ECHOCARDIOGRAM  02/18/2013  ? EF 35-30% with anterolateral, lateral and inferolateral/apical hypokinesis (Takotsubo)   ? TRANSTHORACIC ECHOCARDIOGRAM  02/25/2013  ? EF 55%. No clear regional wall motion abnormalities. Grade 3 diastolic dysfunction. Mild MR  ? TRANSTHORACIC ECHOCARDIOGRAM   December 2015; January 2016  ? a. Normal LV size and function. EF 55-60%. Gr1 DD;; b. Normal LV size function with EF 60-65%. No RWM/A no comment on valve lesions or abnormalities. No comment on diastolic function.  ? TUBAL LIGATION  1979  ? ? ? ?FAMILY HISTORY:  ?Family History  ?Problem Relation Age of Onset  ? Cancer Mother 13  ? Heart attack Mother 23  ? Heart attack Father 38  ? Cancer Father 43  ?     Leukemia  ? Heart disease Brother   ? Heart attack Maternal Grandfather   ? Diabetes Paternal Grandmother   ? Heart attack Paternal Grandfather   ? ? ? ?SOCIAL HISTORY:  reports that she has never smoked. She has never used smokeless tobacco. She reports that she does not drink alcohol and does not use drugs. The patient is married and lives in [place], [place]. ? ? ?ALLERGIES: Penicillins and Benzoin ? ? ?MEDICATIONS:  ?Current Outpatient Medications  ?Medication Sig Dispense Refill  ? aspirin EC 81 MG tablet Take 81 mg by mouth daily.    ? atorvastatin (LIPITOR) 10 MG tablet Take 10 mg by mouth daily.    ? Dulaglutide 0.75 MG/0.5ML SOPN Inject into the skin once a week.    ? famotidine (PEPCID) 20 MG tablet Take 1 tablet (20 mg total) by mouth daily in the afternoon. 90 tablet 3  ? gabapentin (NEURONTIN) 100 MG capsule TAKE 1 CAPSULE BY MOUTH THREE TIMES A DAY    ? glipiZIDE (GLUCOTROL XL) 10 MG 24 hr tablet Take 10 mg by mouth 2 (two) times daily.    ? ipratropium (ATROVENT) 0.06 % nasal spray 1-2 sprays in each nostril every 6 hours to dry up nose 15 mL 5  ? losartan (COZAAR) 100 MG tablet Take 100 mg by mouth daily.    ? metFORMIN (GLUCOPHAGE-XR) 500 MG 24 hr tablet Take 1,000 mg by mouth 2 (two) times a day.    ? metoprolol succinate (TOPROL-XL) 100 MG 24 hr tablet TAKE 1 TABLET DAILY (TAKE WITH OR IMMEDIATELY FOLLOWING A MEAL) 90 tablet 2  ? metoprolol tartrate (LOPRESSOR) 25 MG tablet Take 25 mg by mouth as directed.    ? montelukast (SINGULAIR) 10 MG tablet TAKE 1 TABLET BY MOUTH EVERYDAY AT BEDTIME 30  tablet 10  ? nitroGLYCERIN (NITROSTAT) 0.4 MG SL tablet Place 1 tablet (0.4 mg total) under the tongue every 5 (five) minutes as needed for chest pain. 25 tablet 6  ? ONETOUCH VERIO test strip Inject 1 strip as directed 2 (two) times daily. Use 1 strip to check glucose 1-2 times a day  0  ? ?No current facility-administered medications for this encounter.  ? ? ? ?REVIEW OF SYSTEMS: On review of systems, the patient reports that she is doing well overall without specific breast complaints no other concerns are verbalized. Of note her husband was recently treated for pneumonia and is getting follow up with Dr. Percell Miller in [place] in GI for Barrett's Esophagus.  ? ?  ? ?PHYSICAL EXAM:  ?Wt Readings from  Last 3 Encounters:  ?10/11/20 147 lb 9.6 oz (67 kg)  ?09/09/19 151 lb 6.4 oz (68.7 kg)  ?11/30/18 153 lb (69.4 kg)  ? ?Temp Readings from Last 3 Encounters:  ?04/07/19 98.1 ?F (36.7 ?C) (Temporal)  ?03/11/19 (!) 97.1 ?F (36.2 ?C) (Temporal)  ?12/30/18 97.6 ?F (36.4 ?C) (Temporal)  ? ?BP Readings from Last 3 Encounters:  ?10/11/20 118/72  ?09/09/19 (!) 150/70  ?04/07/19 (!) 162/70  ? ?Pulse Readings from Last 3 Encounters:  ?10/11/20 63  ?09/09/19 64  ?04/07/19 68  ? ? ?In general this is a well appearing caucasian female in no acute distress. She's alert and oriented x4 and appropriate throughout the examination. Cardiopulmonary assessment is negative for acute distress and she exhibits normal effort. Bilateral breast exam is deferred. ? ? ? ?ECOG = 0 ? ?0 - Asymptomatic (Fully active, able to carry on all predisease activities without restriction) ? ?1 - Symptomatic but completely ambulatory (Restricted in physically strenuous activity but ambulatory and able to carry out work of a light or sedentary nature. For example, light housework, office work) ? ?2 - Symptomatic, <50% in bed during the day (Ambulatory and capable of all self care but unable to carry out any work activities. Up and about more than 50% of waking  hours) ? ?3 - Symptomatic, >50% in bed, but not bedbound (Capable of only limited self-care, confined to bed or chair 50% or more of waking hours) ? ?4 - Bedbound (Completely disabled. Cannot carry on any self-car

## 2021-06-05 NOTE — Progress Notes (Signed)
[place] ?CONSULT NOTE ? ?Patient Care Team: ?Mayer Camel, NP as PCP - General (Internal Medicine) ?[name], MD as PCP - Cardiology (Cardiology) ?Jovita Kussmaul, MD as Consulting Physician (General Surgery) ?Nicholas Lose, MD as Consulting Physician (Hematology and Oncology) ?Kyung Rudd, MD as Consulting Physician (Radiation Oncology) ?Mauro Kaufmann, RN as Oncology Nurse Navigator ?Rockwell Germany, RN as Oncology Nurse Navigator ? ?CHIEF COMPLAINTS/PURPOSE OF CONSULTATION:  ?Newly diagnosed breast cancer left breast mass ? ?HISTORY OF PRESENTING ILLNESS:  ?Sarah Flores 76 y.o. female is here because of recent diagnosis of left breast mass ?Screening mammogram  detected a possible mass in the left breast lower inner aspect anterior depth. ?Diagnostic mammogram showed a 0.4 cm oval mass in the left breast at 7'o clock anterior depth 3 cm from the nipple. No other significant masses or calcifications are seen in the breast. The 0.4 cm mass in the left breast is indeterminate.  ?Biopsy  showed intermediate grade DCIS. Prognostic indicators significant for ER 100% positive; PR 100% positive  ?  ? ?I reviewed her records extensively and collaborated the history with the patient. ? ?SUMMARY OF ONCOLOGIC HISTORY: ?Oncology History  ?Ductal carcinoma in situ (DCIS) of left breast  ?06/04/2021 Initial Diagnosis  ? Screening mammogram detected left breast mass 0.7 cm by ultrasound, axilla negative, biopsy revealed low-grade to intermediate grade DCIS ER 100%, PR 100% ?  ?06/06/2021 Cancer Staging  ? Staging form: Breast, AJCC 8th Edition ?- Clinical: cT1b, cN0, cM0, G2, ER+, PR+, HER2: Not Assessed - Signed by Nicholas Lose, MD on 06/06/2021 ?Stage prefix: Initial diagnosis ?Histologic grading system: 3 grade system ? ?  ? ? ? ?MEDICAL HISTORY:  ?Past Medical History:  ?Diagnosis Date  ? Arthritis   ? Breast cancer ([place])   ? Cardiogenic shock ([place])   ? CKD (chronic kidney disease),  stage II   ? Complication of anesthesia   ? 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 Diabetes mellitus ([place])   ? DVT (deep venous thrombosis) ([place]) 03/05/1991  ? "right arm; from IV I had when I had my hysterectomy; on Coumadin for awhile" (02/18/2013)  ? Dyslipidemia, goal LDL below 70   ? Fibromyalgia   ? Hypertension   ? NASH (nonalcoholic steatohepatitis)   ? Dr. Percell Miller in [place]  ? Palpitations   ? a. event monitor 07/2013 did not show significant arrhythmia - mostly NSR 70-100, with occasional S Tachy ~100-115, rare PACs & PVCs - not recorded as symptomatic.  ? ST elevation myocardial infarction (STEMI) of lateral wall (HCC)   ? a. Post-op STEMI 02/2013 with cardiogenic shock, cath revealing clean cors and LV dysfunction c/w Takasubo Syndrome.  ? Stroke [facility])   ? a. CT head at [facility] 03/2014: No acute intracranial abnormality, probable small old cortical infarct in L posterior parietal region.  ? Syncope   ? a. Reported prior near-syncope in 2015 with suggestion of atrial fibrillation but no documentation to support this. An event monitor 07/2013 did not show significant arrhythmia - mostly NSR 70-100, with occasional S Tachy ~100-115, rare PACs & PVCs - not recorded as symptomatic. b. Syncope 03/2014 felt vasovagal.  ? Takotsubo cardiomyopathy   ? ? ?SURGICAL HISTORY: ?Past Surgical History:  ?Procedure Laterality Date  ? ABDOMINAL HYSTERECTOMY  [id number]  ? APPENDECTOMY  02/1966  ? BACK SURGERY    ? [place] X2  ? CARDIAC CATHETERIZATION  02/22/2013  ? Nonischemic; no notable CAD. EF  40%.  ? CHOLECYSTECTOMY  ~2001  ? COLONOSCOPY W/ BIOPSIES    ? LARYNGOSCOPY    ? laryngoscopy micro suspension with left Cymetra injection for dysphonia 09/29/09 [facility])  ? LEFT HEART CATHETERIZATION WITH CORONARY ANGIOGRAM N/A 02/22/2013  ? Procedure: LEFT HEART CATHETERIZATION WITH CORONARY ANGIOGRAM;  Surgeon: Sanda Klein, MD;  Location[facility] C[facility] LAB;  Service: Cardiovascular; angiographically normal  coronary arteries. LV dysfunction consistent with Takotsubo Cardiomyopathy/Syndrome   ? MAXIMUM ACCESS (MAS)POSTERIOR LUMBAR INTERBODY FUSION (PLIF) 1 LEVEL N/A 02/18/2013  ? Procedure: LUMBAR FIVE TO SACRAL ONE MAXIMUM ACCESS (MAS) POSTERIOR LUMBAR INTERBODY FUSION (PLIF) 1 LEVEL;  Surgeon: Eustace Moore, MD;  Location: [place] NEURO ORS;  Service: Neurosurgery;  Laterality: N/A;  ? PARATHYROIDECTOMY  08/07/2009  ? [facility]  ? PLANTAR'S WART EXCISION Bilateral 1960's; 1980  ? TONSILLECTOMY  1950's  ? TRANSTHORACIC ECHOCARDIOGRAM  02/18/2013  ? EF 35-30% with anterolateral, lateral and inferolateral/apical hypokinesis (Takotsubo)   ? TRANSTHORACIC ECHOCARDIOGRAM  02/25/2013  ? EF 55%. No clear regional wall motion abnormalities. Grade 3 diastolic dysfunction. Mild MR  ? TRANSTHORACIC ECHOCARDIOGRAM  December 2015; January 2016  ? a. Normal LV size and function. EF 55-60%. Gr1 DD;; b. Normal LV size function with EF 60-65%. No RWM/A no comment on valve lesions or abnormalities. No comment on diastolic function.  ? TUBAL LIGATION  1979  ? ? ?SOCIAL HISTORY: ?Social History  ? ?Socioeconomic History  ? Marital status: Married  ?  Spouse name: Not on file  ? Number of children: Not on file  ? Years of education: Not on file  ? Highest education level: Not on file  ?Occupational History  ? Occupation: Retired Network engineer  ?Tobacco Use  ? Smoking status: Never  ? Smokeless tobacco: Never  ?Substance and Sexual Activity  ? Alcohol use: No  ? Drug use: No  ? Sexual activity: Not Currently  ?Other Topics Concern  ? Not on file  ?Social History Narrative  ? Lives with husband in [place], [place]. She is a married mother of 2 with 2 grandchildren. She has been married since 36. She is retired Network engineer having completed high school. She does not drink alcohol never smoked. She has not been exercising recently, but wants to get back to walking on a treadmill about 3 days a week.   ? ?Social Determinants of Health  ? ?Financial  Resource Strain: Not on file  ?Food Insecurity: Not on file  ?Transportation Needs: Not on file  ?Physical Activity: Not on file  ?Stress: Not on file  ?Social Connections: Not on file  ?Intimate Partner Violence: Not on file  ? ? ?FAMILY HISTORY: ?Family History  ?Problem Relation Age of Onset  ? Cancer Mother 72  ? Heart attack Mother 38  ? Lung cancer Mother   ? Heart attack Father 69  ? Cancer Father 65  ?     Leukemia  ? Prostate cancer Father   ? Heart disease Brother   ? Heart attack Maternal Grandfather   ? Diabetes Paternal Grandmother   ? Heart attack Paternal Grandfather   ? ? ?ALLERGIES:  is allergic to penicillins and benzoin. ? ?MEDICATIONS:  ?Current Outpatient Medications  ?Medication Sig Dispense Refill  ? aspirin EC 81 MG tablet Take 81 mg by mouth daily.    ? atorvastatin (LIPITOR) 10 MG tablet Take 10 mg by mouth daily.    ? famotidine (PEPCID) 20 MG tablet Take 1 tablet (20 mg total) by mouth daily in the  afternoon. 90 tablet 3  ? glipiZIDE (GLUCOTROL XL) 10 MG 24 hr tablet Take 10 mg by mouth 2 (two) times daily.    ? losartan (COZAAR) 100 MG tablet Take 100 mg by mouth daily.    ? metFORMIN (GLUCOPHAGE-XR) 500 MG 24 hr tablet Take 1,000 mg by mouth 2 (two) times a day.    ? metoprolol succinate (TOPROL-XL) 100 MG 24 hr tablet TAKE 1 TABLET DAILY (TAKE WITH OR IMMEDIATELY FOLLOWING A MEAL) 90 tablet 2  ? metoprolol tartrate (LOPRESSOR) 25 MG tablet Take 50 mg by mouth as directed.    ? montelukast (SINGULAIR) 10 MG tablet TAKE 1 TABLET BY MOUTH EVERYDAY AT BEDTIME 30 tablet 10  ? nitroGLYCERIN (NITROSTAT) 0.4 MG SL tablet Place 1 tablet (0.4 mg total) under the tongue every 5 (five) minutes as needed for chest pain. 25 tablet 6  ? gabapentin (NEURONTIN) 100 MG capsule TAKE 1 CAPSULE BY MOUTH THREE TIMES A DAY    ? ONETOUCH VERIO test strip Inject 1 strip as directed 2 (two) times daily. Use 1 strip to check glucose 1-2 times a day  0  ? ?No current facility-administered medications for this  visit.  ? ? ?REVIEW OF SYSTEMS:   ?Constitutional: Denies fevers, chills or abnormal night sweats ? ?All other systems were reviewed with the patient and are negative. ? ?PHYSICAL EXAMINATION: ?ECOG PERFORMAN

## 2021-06-06 ENCOUNTER — Inpatient Hospital Stay (HOSPITAL_BASED_OUTPATIENT_CLINIC_OR_DEPARTMENT_OTHER): Payer: Medicare Other | Admitting: Hematology and Oncology

## 2021-06-06 ENCOUNTER — Ambulatory Visit (HOSPITAL_BASED_OUTPATIENT_CLINIC_OR_DEPARTMENT_OTHER): Payer: Medicare Other | Admitting: Genetic Counselor

## 2021-06-06 ENCOUNTER — Encounter: Payer: Self-pay | Admitting: Hematology and Oncology

## 2021-06-06 ENCOUNTER — Other Ambulatory Visit: Payer: Self-pay

## 2021-06-06 ENCOUNTER — Ambulatory Visit
Admission: RE | Admit: 2021-06-06 | Discharge: 2021-06-06 | Disposition: A | Discharge status: Home / Self Care | Payer: Medicare Other | Source: Ambulatory Visit | Attending: Radiation Oncology | Admitting: Radiation Oncology

## 2021-06-06 ENCOUNTER — Encounter: Payer: Self-pay | Admitting: Genetic Counselor

## 2021-06-06 ENCOUNTER — Ambulatory Visit: Payer: Self-pay | Admitting: General Surgery

## 2021-06-06 ENCOUNTER — Inpatient Hospital Stay: Payer: Medicare Other | Attending: Hematology and Oncology

## 2021-06-06 ENCOUNTER — Ambulatory Visit: Payer: MEDICARE | Admitting: Physical Therapy

## 2021-06-06 DIAGNOSIS — Z801 Family history of malignant neoplasm of trachea, bronchus and lung: Secondary | ICD-10-CM

## 2021-06-06 DIAGNOSIS — N182 Chronic kidney disease, stage 2 (mild): Secondary | ICD-10-CM | POA: Diagnosis not present

## 2021-06-06 DIAGNOSIS — Z806 Family history of leukemia: Secondary | ICD-10-CM | POA: Insufficient documentation

## 2021-06-06 DIAGNOSIS — D0512 Intraductal carcinoma in situ of left breast: Secondary | ICD-10-CM

## 2021-06-06 DIAGNOSIS — Z90710 Acquired absence of both cervix and uterus: Secondary | ICD-10-CM | POA: Insufficient documentation

## 2021-06-06 DIAGNOSIS — E1122 Type 2 diabetes mellitus with diabetic chronic kidney disease: Secondary | ICD-10-CM

## 2021-06-06 DIAGNOSIS — I129 Hypertensive chronic kidney disease with stage 1 through stage 4 chronic kidney disease, or unspecified chronic kidney disease: Secondary | ICD-10-CM | POA: Insufficient documentation

## 2021-06-06 DIAGNOSIS — Z803 Family history of malignant neoplasm of breast: Secondary | ICD-10-CM

## 2021-06-06 DIAGNOSIS — Z8042 Family history of malignant neoplasm of prostate: Secondary | ICD-10-CM | POA: Insufficient documentation

## 2021-06-06 DIAGNOSIS — Z8041 Family history of malignant neoplasm of ovary: Secondary | ICD-10-CM | POA: Diagnosis not present

## 2021-06-06 HISTORY — DX: Family history of malignant neoplasm of ovary: Z80.41

## 2021-06-06 HISTORY — DX: Family history of malignant neoplasm of prostate: Z80.42

## 2021-06-06 HISTORY — DX: Family history of malignant neoplasm of breast: Z80.3

## 2021-06-06 LAB — CMP (CANCER CENTER ONLY)
ALT: 22 U/L (ref 0–44)
AST: 20 U/L (ref 15–41)
Albumin: 4.3 g/dL (ref 3.5–5.0)
Alkaline Phosphatase: 49 U/L (ref 38–126)
Anion gap: 11 (ref 5–15)
BUN: 9 mg/dL (ref 8–23)
CO2: 28 mmol/L (ref 22–32)
Calcium: 7.4 mg/dL — ABNORMAL LOW (ref 8.9–10.3)
Chloride: 99 mmol/L (ref 98–111)
Creatinine: 0.83 mg/dL (ref 0.44–1.00)
GFR, Estimated: >60 mL/min (ref >60)
Glucose, Bld: 119 mg/dL — ABNORMAL HIGH (ref 70–99)
Potassium: 3.8 mmol/L (ref 3.5–5.1)
Sodium: 138 mmol/L (ref 135–145)
Total Bilirubin: 1.6 mg/dL — ABNORMAL HIGH (ref 0.3–1.2)
Total Protein: 7.2 g/dL (ref 6.5–8.1)

## 2021-06-06 LAB — CBC WITH DIFFERENTIAL (CANCER CENTER ONLY)
Abs Immature Granulocytes: 0.08 10*3/uL — ABNORMAL HIGH (ref 0.00–0.07)
Basophils Absolute: 0.1 10*3/uL (ref 0.0–0.1)
Basophils Relative: 1 %
Eosinophils Absolute: 0.3 10*3/uL (ref 0.0–0.5)
Eosinophils Relative: 3 %
HCT: 37.5 % (ref 36.0–46.0)
Hemoglobin: 12.9 g/dL (ref 12.0–15.0)
Immature Granulocytes: 1 %
Lymphocytes Relative: 36 %
Lymphs Abs: 3.9 10*3/uL (ref 0.7–4.0)
MCH: 33 pg (ref 26.0–34.0)
MCHC: 34.4 g/dL (ref 30.0–36.0)
MCV: 95.9 fL (ref 80.0–100.0)
Monocytes Absolute: 0.8 10*3/uL (ref 0.1–1.0)
Monocytes Relative: 7 %
Neutro Abs: 5.8 10*3/uL (ref 1.7–7.7)
Neutrophils Relative %: 52 %
Platelet Count: 301 10*3/uL (ref 150–400)
RBC: 3.91 MIL/uL (ref 3.87–5.11)
RDW: 13.7 % (ref 11.5–15.5)
WBC Count: 11.1 10*3/uL — ABNORMAL HIGH (ref 4.0–10.5)
nRBC: 0 % (ref 0.0–0.2)

## 2021-06-06 LAB — GENETIC SCREENING ORDER

## 2021-06-06 NOTE — Assessment & Plan Note (Signed)
05/21/2021 :Screening mammogram detected left breast mass 0.7 cm by ultrasound, axilla negative, biopsy revealed low-grade to intermediate grade DCIS ER 100%, PR 100% ? ?Pathology review: I discussed with the patient the difference between DCIS and invasive breast cancer. It is considered a precancerous lesion. DCIS is classified as a 0. It is generally detected through mammograms as calcifications. We discussed the significance of grades and its impact on prognosis. We also discussed the importance of ER and PR receptors and their implications to adjuvant treatment options. Prognosis of DCIS dependence on grade, comedo necrosis. It is anticipated that if not treated, 20-30% of DCIS can develop into invasive breast cancer. ? ?Recommendation: ?1. Breast conserving surgery ?2. Followed by adjuvant radiation therapy ?3. Followed by antiestrogen therapy with tamoxifen ?5 years ? ?Tamoxifen counseling: We discussed the risks and benefits of tamoxifen. These include but not limited to insomnia, hot flashes, mood changes, vaginal dryness, and weight gain. Although rare, serious side effects including endometrial cancer, risk of blood clots were also discussed. We strongly believe that the benefits far outweigh the risks. Patient understands these risks and consented to starting treatment. Planned treatment duration is 5 years. ? ?Return to clinic after surgery to discuss the final pathology report and come up with an adjuvant treatment plan. ? ? ?

## 2021-06-06 NOTE — Research (Signed)
MTG-015 Discovery and Evaluation of Biomarkers for the Diagnosis and Treatment of Disease ?Patient Sarah Flores was identified by Dr Lindi Adie as a potential candidate for the above listed study.  This Clinical Research Nurse met with Sarah Flores, [id number], on 06/06/21 in a manner and location that ensures patient privacy to discuss participation in the above listed research study.  Patient is Accompanied by her husband .  A copy of the informed consent document and separate HIPAA Authorization was provided to the patient.  Patient reads, speaks, and understands [place].   ?Patient was provided with the business card of this Nurse and encouraged to contact the research team with any questions.  Approximately 15 minutes were spent with the patient reviewing the informed consent documents.  Patient was provided the option of taking informed consent documents home to review and was encouraged to review at their convenience with their support network, including other care providers. Patient took the consent documents home to review. ?Patient stated she was interested in participating and requested that research go ahead and make an appointment for Monday, 4/10. Patient said that she would call research if she changed her mind over the weekend. Patient provided with direct contact information and encouraged to call with any questions or concerns.  ?Vickii Penna, RN, BSN, CPN ?Clinical Research Nurse I ?[phone] ?06/06/2021 2:59 PM ? ? ?

## 2021-06-06 NOTE — Progress Notes (Signed)
REFERRING PROVIDER: ?Nicholas Lose, MD ?[address],  [place] [id number] ? ?PRIMARY PROVIDER:  ?Mayer Camel, NP ? ?PRIMARY REASON FOR VISIT:  ?1. Ductal carcinoma in situ (DCIS) of left breast   ?2. Family history of breast cancer   ?3. Family history of prostate cancer   ?4. Family history of ovarian cancer   ? ? ?HISTORY OF PRESENT ILLNESS:   ?Ms. Kosanke, a 76 y.o. female, was seen for a [place] cancer genetics consultation during the breast multidisciplinary clinic at the request of Dr. Lindi Adie due to a personal and family history of cancer.  Ms. Humphres presents to clinic today to discuss the possibility of a hereditary predisposition to cancer, to discuss genetic testing, and to further clarify her future cancer risks, as well as potential cancer risks for family members.  ? ?In April 2023, at the age of 77, Ms. Sakata was diagnosed with ductal carcinoma in situ of the left breast (ER+/PR+). The preliminary treatment plan includes breast conserving surgery, adjuvant radiation, and anti-estrogens.  ? ?CANCER HISTORY:  ?Oncology History  ?Ductal carcinoma in situ (DCIS) of left breast  ?06/04/2021 Initial Diagnosis  ? Screening mammogram detected left breast mass 0.7 cm by ultrasound, axilla negative, biopsy revealed low-grade to intermediate grade DCIS ER 100%, PR 100% ?  ?06/06/2021 Cancer Staging  ? Staging form: Breast, AJCC 8th Edition ?- Clinical: cT1b, cN0, cM0, G2, ER+, PR+, HER2: Not Assessed - Signed by Nicholas Lose, MD on 06/06/2021 ?Stage prefix: Initial diagnosis ?Histologic grading system: 3 grade system ? ?  ? ? ?RISK FACTORS:  ?Mammogram within the last year: yes ?Colonoscopy: yes;  most recent unknown . ?Hysterectomy: yes.  ?Ovaries intact: no.  ?Menarche was at age 36.  ?First live birth at age 80.  ?OCP use for approximately 1 year.  ?HRT use: yes; unknown number of years ? ? ?Past Medical History:  ?Diagnosis Date  ? Arthritis   ? Breast cancer ([place])   ?  Cardiogenic shock ([place])   ? CKD (chronic kidney disease), stage II   ? Complication of anesthesia   ? 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 Diabetes mellitus ([place])   ? DVT (deep venous thrombosis) ([place]) 03/05/1991  ? "right arm; from IV I had when I had my hysterectomy; on Coumadin for awhile" (02/18/2013)  ? Dyslipidemia, goal LDL below 70   ? Family history of breast cancer 06/06/2021  ? Family history of ovarian cancer 06/06/2021  ? Family history of prostate cancer 06/06/2021  ? Fibromyalgia   ? Hypertension   ? NASH (nonalcoholic steatohepatitis)   ? Dr. Percell Miller in [place]  ? Palpitations   ? a. event monitor 07/2013 did not show significant arrhythmia - mostly NSR 70-100, with occasional S Tachy ~100-115, rare PACs & PVCs - not recorded as symptomatic.  ? ST elevation myocardial infarction (STEMI) of lateral wall (HCC)   ? a. Post-op STEMI 02/2013 with cardiogenic shock, cath revealing clean cors and LV dysfunction c/w Takasubo Syndrome.  ? Stroke [facility])   ? a. CT head at [facility] 03/2014: No acute intracranial abnormality, probable small old cortical infarct in L posterior parietal region.  ? Syncope   ? a. Reported prior near-syncope in 2015 with suggestion of atrial fibrillation but no documentation to support this. An event monitor 07/2013 did not show significant arrhythmia - mostly NSR 70-100, with occasional S Tachy ~100-115, rare PACs & PVCs - not recorded as symptomatic. b. Syncope 03/2014 felt vasovagal.  ?  Takotsubo cardiomyopathy   ? ? ?Past Surgical History:  ?Procedure Laterality Date  ? ABDOMINAL HYSTERECTOMY  [id number]  ? APPENDECTOMY  02/1966  ? BACK SURGERY    ? [place] X2  ? CARDIAC CATHETERIZATION  02/22/2013  ? Nonischemic; no notable CAD. EF 40%.  ? CHOLECYSTECTOMY  ~2001  ? COLONOSCOPY W/ BIOPSIES    ? LARYNGOSCOPY    ? laryngoscopy micro suspension with left Cymetra injection for dysphonia 09/29/09 [facility])  ? LEFT HEART CATHETERIZATION WITH CORONARY ANGIOGRAM N/A 02/22/2013   ? Procedure: LEFT HEART CATHETERIZATION WITH CORONARY ANGIOGRAM;  Surgeon: Sanda Klein, MD;  Location: [facility] CATH LAB;  Service: Cardiovascular; angiographically normal coronary arteries. LV dysfunction consistent with Takotsubo Cardiomyopathy/Syndrome   ? MAXIMUM ACCESS (MAS)POSTERIOR LUMBAR INTERBODY FUSION (PLIF) 1 LEVEL N/A 02/18/2013  ? Procedure: LUMBAR FIVE TO SACRAL ONE MAXIMUM ACCESS (MAS) POSTERIOR LUMBAR INTERBODY FUSION (PLIF) 1 LEVEL;  Surgeon: Eustace Moore, MD;  Location: [place] NEURO ORS;  Service: Neurosurgery;  Laterality: N/A;  ? PARATHYROIDECTOMY  08/07/2009  ? [facility]  ? PLANTAR'S WART EXCISION Bilateral 1960's; 1980  ? TONSILLECTOMY  1950's  ? TRANSTHORACIC ECHOCARDIOGRAM  02/18/2013  ? EF 35-30% with anterolateral, lateral and inferolateral/apical hypokinesis (Takotsubo)   ? TRANSTHORACIC ECHOCARDIOGRAM  02/25/2013  ? EF 55%. No clear regional wall motion abnormalities. Grade 3 diastolic dysfunction. Mild MR  ? TRANSTHORACIC ECHOCARDIOGRAM  December 2015; January 2016  ? a. Normal LV size and function. EF 55-60%. Gr1 DD;; b. Normal LV size function with EF 60-65%. No RWM/A no comment on valve lesions or abnormalities. No comment on diastolic function.  ? TUBAL LIGATION  1979  ? ? ? ?FAMILY HISTORY:  ?We obtained a detailed, 4-generation family history.  Significant diagnoses are listed below: ?Family History  ?Problem Relation Age of Onset  ? Lung cancer Mother 77  ? Leukemia Father 64  ? Prostate cancer Father 11  ?     metastatic  ? Ovarian cancer Maternal Aunt   ?     dx after 50  ? Cancer Maternal Aunt   ?     unknown type; ? lung; dx after 50  ? Colon cancer Maternal Uncle   ?     dx after 50  ? Ovarian cancer Paternal Aunt   ?     dx after 78  ? Brain cancer Maternal Grandmother   ?     dx mid 35s  ? Breast cancer Cousin   ?     paternal female first cousins; dx 51s; x3 cousins  ? ? ? ?Ms. Prew is unaware of previous family history of genetic testing for hereditary cancer risks.   There is no reported Ashkenazi Jewish ancestry. There is no known consanguinity. ? ?GENETIC COUNSELING ASSESSMENT: Ms. Graef is a 76 y.o. female with a personal and family history of cancer which is somewhat suggestive of a hereditary cancer syndrome and predisposition to cancer given the presence of related cancers in the family. We, therefore, discussed and recommended the following at today's visit.  ? ?DISCUSSION: We discussed that 5 - 10% of cancer is hereditary, with most cases of hereditary breast cancer associated with mutations in BRCA1/2.  There are other genes that can be associated with hereditary breast cancer syndromes.  Type of cancer risk and level of risk are gene-specific. We discussed that testing is beneficial for several reasons including knowing how to follow individuals after completing their treatment, identifying whether potential treatment options would be beneficial,  and understanding if other family members could be at risk for cancer and allowing them to undergo genetic testing.  ? ?We reviewed the characteristics, features and inheritance patterns of hereditary cancer syndromes. We also discussed genetic testing, including the appropriate family members to test, the process of testing, insurance coverage and turn-around-time for results. We discussed the implications of a negative, positive and/or variant of uncertain significant result. In order to get genetic test results in a timely manner so that Ms. Gough can use these genetic test results for surgical decisions, we recommended Ms. Baxendale pursue genetic testing for the [employer].  The BRCAplus panel offered by [employer] and includes sequencing and deletion/duplication analysis for the following 8 genes: ATM, BRCA1, BRCA2, CDH1, CHEK2, PALB2, PTEN, and TP53. Once complete, we recommend Ms. Odonnel pursue reflex genetic testing to a more comprehensive gene panel.  ? ?The CancerNext-Expanded gene panel offered by [facility] and includes sequencing, rearrangement, and RNA analysis for the following 77 genes: AIP, ALK, APC, ATM, AXIN2, BAP1, BARD1, BLM, BMPR1A, BRCA1, BRCA2, BRIP1, CDC73, CDH1, CDK4, CDKN1B, CDKN2A, CHEK2, C

## 2021-06-07 ENCOUNTER — Other Ambulatory Visit: Payer: Medicare Other

## 2021-06-08 ENCOUNTER — Encounter: Payer: Self-pay | Admitting: *Deleted

## 2021-06-08 ENCOUNTER — Encounter: Payer: Self-pay | Admitting: General Practice

## 2021-06-08 NOTE — Progress Notes (Signed)
[facility] Psychosocial Distress Screening ?Spiritual Care ? ?Met with Sarah Flores by phone following Breast Multidisciplinary Clinic to introduce [place] team/resources, reviewing distress screen per protocol.  The patient scored a 1 on the Psychosocial Distress Thermometer which indicates mild distress. Also assessed for distress and other psychosocial needs.  ? ? 06/08/2021  ?ONCBCN DISTRESS SCREENING   ?Screening Type Initial Screening   ?Distress experienced in past week (1-10) 1   ?Family Problem type Partner   ?Referral to support programs Yes   ? ?[name] reports good support from her faith, the many people praying for her, her pastor, and her best friend. At the same time, she is nervous about her diagnosis because her parents and aunts and uncles died of cancer, which stirs up grief and fear of suffering. Additionally, her husband has been sick with recent hospitalizations and has an upcoming endoscopy. ? ?Follow up needed: Yes.  We plan to follow up by phone the week after next for a pastoral check-in and opportunity to share and process updates. ? ? ?Chaplain Lorrin Jackson, MDiv, [facility] ?Pager [phone] ?Voicemail [phone] ? ? ? ? ?  ?

## 2021-06-11 ENCOUNTER — Encounter: Payer: Self-pay | Admitting: *Deleted

## 2021-06-11 ENCOUNTER — Telehealth: Payer: Self-pay | Admitting: Radiation Oncology

## 2021-06-11 ENCOUNTER — Inpatient Hospital Stay: Payer: Medicare Other

## 2021-06-11 ENCOUNTER — Telehealth: Payer: Self-pay | Admitting: Hematology and Oncology

## 2021-06-11 ENCOUNTER — Telehealth: Payer: Self-pay | Admitting: *Deleted

## 2021-06-11 ENCOUNTER — Other Ambulatory Visit: Payer: Self-pay

## 2021-06-11 DIAGNOSIS — D0512 Intraductal carcinoma in situ of left breast: Secondary | ICD-10-CM

## 2021-06-11 LAB — RESEARCH LABS

## 2021-06-11 NOTE — Research (Signed)
?  This Nurse has reviewed this patient's inclusion and exclusion criteria as a second review and confirms Sarah Flores is eligible for study participation.  Patient may continue with enrollment.  ?Jeral Fruit, RN ?06/11/21 ?11:33 AM ? ?

## 2021-06-11 NOTE — Telephone Encounter (Signed)
Returned missed call from pt. Unable to LVM on mobile #, phone kept ringing. ?

## 2021-06-11 NOTE — Telephone Encounter (Signed)
Left vm regarding [facility] from 06/06/21. Contact information provided for questions or needs. ?

## 2021-06-11 NOTE — Research (Signed)
MTG-015 Discovery and Evaluation of Biomarkers for the Diagnosis and Treatment of Disease ?  ?Patient's referring provider, Dr Lindi Adie, is out of the office. Dr Chryl Heck reviewed the patient's eligibility criteria in his absence and  ?agreed that patient should proceed with enrollment. ? ?Vickii Penna, RN, BSN, CPN ?Clinical Research Nurse I ?[phone] ? ?06/11/2021 3:15 PM ? ?  ? ?

## 2021-06-11 NOTE — Telephone Encounter (Signed)
Per 4/10 inbasket. Called pt and left message with details of appointment.  Left call back number if changes are needed.  ?

## 2021-06-11 NOTE — Telephone Encounter (Signed)
LVM to schedule post-surgery consultation for XRT ?

## 2021-06-11 NOTE — Research (Signed)
MTG-015 Discovery and Evaluation of Biomarkers for the Diagnosis and Treatment of Disease ?  ?CONSENT: Patient Sarah Flores was identified by Dr Lindi Adie as a potential candidate for the above listed study.  This Clinical Research Nurse met with Sarah Flores, Sarah Flores on 06/11/21 in a manner and location that ensures patient privacy to discuss participation in the above listed research study.  Patient is Unaccompanied.  Patient was previously provided with informed consent documents.  Patient confirmed they have read the informed consent documents. ? ?As outlined in the informed consent form, this Nurse and Alexis Goodell discussed the purpose of the research study, the investigational nature of the study, study procedures and requirements for study participation, potential risks and benefits of study participation, as well as alternatives to participation.  This study is not blinded or double-blinded. The patient understands participation is voluntary and they may withdraw from study participation at any time.  This study does not involve randomization.  This study does not involve an investigational drug or device. This study does not involve a placebo. Patient understands enrollment is pending full eligibility review.  ? ?Confidentiality and how the patient's information will be used as part of study participation were discussed.  Patient was informed there is reimbursement provided for their time and effort spent on trial participation.  The patient is encouraged to discuss research study participation with their insurance provider to determine what costs they may incur as part of study participation, including research related injury.   ? ?All questions were answered to patient's satisfaction.  The informed consent and separate HIPAA Authorization was reviewed page by page.  The patient's mental and emotional status is appropriate to provide informed consent, and the patient verbalizes an understanding  of study participation.  Patient has agreed to participate in the above listed research study and has voluntarily signed the informed consent IRB approved 01/22/21 and separate HIPAA Authorization, version 5 (IRB approved 01/22/21)  on 06/11/21 at 1110AM.  The patient was provided with a copy of the signed informed consent form and separate HIPAA Authorization for their reference.  No study specific procedures were obtained prior to the signing of the informed consent document.  Approximately 20 minutes were spent with the patient reviewing the informed consent documents.  Patient was not requested to complete a Release of Information form. ?  ?ELIGIBILITY: This Nurse has reviewed this patient's inclusion and exclusion criteria and confirmed Sarah Flores is eligible for study participation.  Patient will continue with enrollment. ?  ?DATA COLLECTION: Patient reports her last meal was at 7am this morning, 06/11/21. Patient denies a history of alcohol and tobacco use. She reports a history of lung cancer in her mother in ~1986 and prostate cancer and leukemia in her father in ~1998. She denies any other known history of cancer in her immediate family. She reports she has been vaccinated against COVID with two doses of the Moderna vaccine and has previously tested positive for COVID. Patient cannot recall dates of vaccinations or positive test at this time, but states that she will confirm with her PCP on Thursday. She reports her only other vaccine in the past year is a flu shot in October of 2022. Reviewed the patient's medical history and medication list; she confirmed validity and correctness. Patient is post-menopausal status-post a hysterectomy in 1992. ? ?BLOOD COLLECTION: Blood specimen was collected using fresh venipuncture. ?  ?GIFT CARD: The patient was given a $50 visa gift card for her participation in  today's research activities. ? ?Research will follow-up with patient on Friday, 4/14, for outstanding  data collection. ? ?Vickii Penna, RN, BSN, CPN ?Clinical Research Nurse I ?[phone] ? ?06/11/2021 11:29 AM ? ?  ? ?

## 2021-06-12 ENCOUNTER — Telehealth: Payer: Self-pay | Admitting: Radiation Oncology

## 2021-06-12 NOTE — Telephone Encounter (Signed)
Unable to LVM on mobile #. LVM on home # to scheduled consult. ?

## 2021-06-13 NOTE — Progress Notes (Signed)
Surgical Instructions ? ? ? Your procedure is scheduled on Thursday, April 20th, 2023. ? ? Report to [facility] Main Entrance "A" at 11:00 A.M., then check in with the Admitting office. ? Call this number if you have problems the morning of surgery: ? [phone] ? ? If you have any questions prior to your surgery date call [phone]: Open Monday-Friday 8am-4pm ? ? ? Remember: ? Do not eat after midnight the night before your surgery ? ?You may drink clear liquids until 10:00 the morning of your surgery.   ?Clear liquids allowed are: Water, Non-Citrus Juices (without pulp), Carbonated Beverages, Clear Tea, Black Coffee ONLY (NO MILK, CREAM OR POWDERED CREAMER of any kind), and Gatorade ?  ? Take these medicines the morning of surgery with A SIP OF WATER:  ? ?atorvastatin (LIPITOR) ?famotidine (PEPCID) ?metoprolol succinate (TOPROL-XL) ? ? ?nitroGLYCERIN (NITROSTAT) - as needed ? ? ?Follow your surgeon's instructions on when to stop Aspirin.  If no instructions were given by your surgeon then you will need to call the office to get those instructions.    ? ?As of today, STOP taking any Aspirin (unless otherwise instructed by your surgeon) Aleve, Naproxen, Ibuprofen, Motrin, Advil, Goody's, BC's, all herbal medications, fish oil, and all vitamins. ? ? ?WHAT DO I DO ABOUT MY DIABETES MEDICATION? ? ? ?Do not take glipiZIDE (GLUCOTROL XL) the night before surgery and the day of surgery.  ? ?Do not take metFORMIN (GLUCOPHAGE-XR) the morning of surgery. ? ? ?HOW TO MANAGE YOUR DIABETES ?BEFORE AND AFTER SURGERY ? ?Why is it important to control my blood sugar before and after surgery? ?Improving blood sugar levels before and after surgery helps healing and can limit problems. ?A way of improving blood sugar control is eating a healthy diet by: ? Eating less sugar and carbohydrates ? Increasing activity/exercise ? Talking with your doctor about reaching your blood sugar goals ?High blood sugars (greater than 180 mg/dL)  can raise your risk of infections and slow your recovery, so you will need to focus on controlling your diabetes during the weeks before surgery. ?Make sure that the doctor who takes care of your diabetes knows about your planned surgery including the date and location. ? ?How do I manage my blood sugar before surgery? ?Check your blood sugar at least 4 times a day, starting 2 days before surgery, to make sure that the level is not too high or low. ? ?Check your blood sugar the morning of your surgery when you wake up and every 2 hours until you get to the Short Stay unit. ? ?If your blood sugar is less than 70 mg/dL, you will need to treat for low blood sugar: ?Do not take insulin. ?Treat a low blood sugar (less than 70 mg/dL) with ? cup of clear juice (cranberry or apple), 4 glucose tablets, OR glucose gel. ?Recheck blood sugar in 15 minutes after treatment (to make sure it is greater than 70 mg/dL). If your blood sugar is not greater than 70 mg/dL on recheck, call [phone] for further instructions. ?Report your blood sugar to the short stay nurse when you get to Short Stay. ? ?If you are admitted to the hospital after surgery: ?Your blood sugar will be checked by the staff and you will probably be given insulin after surgery (instead of oral diabetes medicines) to make sure you have good blood sugar levels. ?The goal for blood sugar control after surgery is 80-180 mg/dL.  ? ? ? ?The day of surgery: ?         ?  Do not wear jewelry or makeup ?Do not wear lotions, powders, perfumes, or deodorant. ?Do not shave 48 hours prior to surgery.   ?Do not bring valuables to the hospital. ?Do not wear nail polish, gel polish, artificial nails, or any other type of covering on natural nails (fingers and toes) ?If you have artificial nails or gel coating that need to be removed by a nail salon, please have this removed prior to surgery. Artificial nails or gel coating may interfere with anesthesia's ability to adequately  monitor your vital signs. ? ? ?[facility] is not responsible for any belongings or valuables. .  ? ?Do NOT Smoke (Tobacco/Vaping)  24 hours prior to your procedure ? ?If you use a CPAP at night, you may bring your mask for your overnight stay. ?  ?Contacts, glasses, hearing aids, dentures or partials may not be worn into surgery, please bring cases for these belongings ?  ?For patients admitted to the hospital, discharge time will be determined by your treatment team. ?  ?Patients discharged the day of surgery will not be allowed to drive home, and someone needs to stay with them for 24 hours. ? ? ?SURGICAL WAITING ROOM VISITATION ?Patients having surgery or a procedure in a hospital may have two support people. ?Children under the age of 13 must have an adult with them who is not the patient. ?They may stay in the waiting area during the procedure and may switch out with other visitors. If the patient needs to stay at the hospital during part of their recovery, the visitor guidelines for inpatient rooms apply. ? ?Please refer to the [facility] website for the visitor guidelines for Inpatients (after your surgery is over and you are in a regular room).  ? ? ?Special instructions:   ? ?Oral Hygiene is also important to reduce your risk of infection.  Remember - BRUSH YOUR TEETH THE MORNING OF SURGERY WITH YOUR REGULAR TOOTHPASTE ? ? ?[facility]- Preparing For Surgery ? ?Before surgery, you can play an important role. Because skin is not sterile, your skin needs to be as free of germs as possible. You can reduce the number of germs on your skin by washing with CHG (chlorahexidine gluconate) Soap before surgery.  CHG is an antiseptic cleaner which kills germs and bonds with the skin to continue killing germs even after washing.   ? ? ?Please do not use if you have an allergy to CHG or antibacterial soaps. If your skin becomes reddened/irritated stop using the CHG.  ?Do not shave (including legs and underarms) for at  least 48 hours prior to first CHG shower. It is OK to shave your face. ? ?Please follow these instructions carefully. ?  ? ? Shower the NIGHT BEFORE SURGERY and the MORNING OF SURGERY with CHG Soap.  ? If you chose to wash your hair, wash your hair first as usual with your normal shampoo. After you shampoo, rinse your hair and body thoroughly to remove the shampoo.  Then [employer] and genitals (private parts) with your normal soap and rinse thoroughly to remove soap. ? ?After that Use CHG Soap as you would any other liquid soap. You can apply CHG directly to the skin and wash gently with a scrungie or a clean washcloth.  ? ?Apply the CHG Soap to your body ONLY FROM THE NECK DOWN.  Do not use on open wounds or open sores. Avoid contact with your eyes, ears, mouth and genitals (private parts). Wash Face and genitals (  private parts)  with your normal soap.  ? ?Wash thoroughly, paying special attention to the area where your surgery will be performed. ? ?Thoroughly rinse your body with warm water from the neck down. ? ?DO NOT shower/wash with your normal soap after using and rinsing off the CHG Soap. ? ?Pat yourself dry with a CLEAN TOWEL. ? ?Wear CLEAN PAJAMAS to bed the night before surgery ? ?Place CLEAN SHEETS on your bed the night before your surgery ? ?DO NOT SLEEP WITH PETS. ? ? ?Day of Surgery: ? ?Take a shower with CHG soap. ?Wear Clean/Comfortable clothing the morning of surgery ?Do not apply any deodorants/lotions.   ?Remember to brush your teeth WITH YOUR REGULAR TOOTHPASTE. ? ? ? ?If you received a COVID test during your pre-op visit  it is requested that you wear a mask when out in public, stay away from anyone that may not be feeling well and notify your surgeon if you develop symptoms. If you have been in contact with anyone that has tested positive in the last 10 days please notify you surgeon. ? ?  ?Please read over the following fact sheets that you were given.   ?

## 2021-06-14 ENCOUNTER — Encounter: Payer: Self-pay | Admitting: *Deleted

## 2021-06-14 ENCOUNTER — Other Ambulatory Visit: Payer: Self-pay

## 2021-06-14 ENCOUNTER — Encounter (HOSPITAL_COMMUNITY)
Admission: RE | Admit: 2021-06-14 | Discharge: 2021-06-14 | Disposition: A | Discharge status: Home / Self Care | Payer: Medicare Other | Source: Ambulatory Visit | Attending: General Surgery | Admitting: General Surgery

## 2021-06-14 ENCOUNTER — Encounter (HOSPITAL_COMMUNITY): Payer: Self-pay

## 2021-06-14 DIAGNOSIS — Z01818 Encounter for other preprocedural examination: Secondary | ICD-10-CM | POA: Insufficient documentation

## 2021-06-14 DIAGNOSIS — I129 Hypertensive chronic kidney disease with stage 1 through stage 4 chronic kidney disease, or unspecified chronic kidney disease: Secondary | ICD-10-CM | POA: Diagnosis not present

## 2021-06-14 DIAGNOSIS — M797 Fibromyalgia: Secondary | ICD-10-CM | POA: Diagnosis not present

## 2021-06-14 DIAGNOSIS — Z8673 Personal history of transient ischemic attack (TIA), and cerebral infarction without residual deficits: Secondary | ICD-10-CM | POA: Diagnosis not present

## 2021-06-14 DIAGNOSIS — N179 Acute kidney failure, unspecified: Secondary | ICD-10-CM | POA: Diagnosis not present

## 2021-06-14 DIAGNOSIS — Z7984 Long term (current) use of oral hypoglycemic drugs: Secondary | ICD-10-CM | POA: Diagnosis not present

## 2021-06-14 DIAGNOSIS — Z90710 Acquired absence of both cervix and uterus: Secondary | ICD-10-CM | POA: Insufficient documentation

## 2021-06-14 DIAGNOSIS — I517 Cardiomegaly: Secondary | ICD-10-CM | POA: Diagnosis not present

## 2021-06-14 DIAGNOSIS — I252 Old myocardial infarction: Secondary | ICD-10-CM | POA: Insufficient documentation

## 2021-06-14 DIAGNOSIS — D649 Anemia, unspecified: Secondary | ICD-10-CM | POA: Insufficient documentation

## 2021-06-14 DIAGNOSIS — Z79899 Other long term (current) drug therapy: Secondary | ICD-10-CM | POA: Diagnosis not present

## 2021-06-14 DIAGNOSIS — E1122 Type 2 diabetes mellitus with diabetic chronic kidney disease: Secondary | ICD-10-CM | POA: Insufficient documentation

## 2021-06-14 DIAGNOSIS — I4892 Unspecified atrial flutter: Secondary | ICD-10-CM | POA: Diagnosis not present

## 2021-06-14 DIAGNOSIS — N182 Chronic kidney disease, stage 2 (mild): Secondary | ICD-10-CM | POA: Insufficient documentation

## 2021-06-14 DIAGNOSIS — K7581 Nonalcoholic steatohepatitis (NASH): Secondary | ICD-10-CM | POA: Diagnosis not present

## 2021-06-14 DIAGNOSIS — Z86718 Personal history of other venous thrombosis and embolism: Secondary | ICD-10-CM | POA: Diagnosis not present

## 2021-06-14 DIAGNOSIS — D0512 Intraductal carcinoma in situ of left breast: Secondary | ICD-10-CM | POA: Diagnosis not present

## 2021-06-14 HISTORY — DX: Anemia, unspecified: D64.9

## 2021-06-14 HISTORY — DX: Personal history of urinary calculi: Z87.442

## 2021-06-14 LAB — GLUCOSE, CAPILLARY: Glucose-Capillary: 152 mg/dL — ABNORMAL HIGH (ref 70–99)

## 2021-06-14 NOTE — Progress Notes (Signed)
PCP - Melissa J. Brown-Patram ?Cardiologist - Abran Richard MD ? ?PPM/ICD - Denies ?Device Orders -  ?Rep Notified -  ? ?Chest x-ray - na ?EKG - 06/14/21 ?Stress Test - none ?ECHO - 03/09/14 ?Cardiac Cath - 1990 ? ?Sleep Study - none ?CPAP -  ? ?Fasting Blood Sugar - 125 ?Checks Blood Sugar once a day ? ?Blood Thinner Instructions:na ?Aspirin Instructions:Pt said she will call Dr. Ethlyn Gallery office if she doesn't find aspirin instructions in her papers she received from his office. ? ?ERAS Protcol -clear liquids until 1000 ?PRE-SURGERY Ensure or G2-no  ? ?COVID TEST-na  ? ? ?Anesthesia review: yes-elevated BP and cardiac history ? ?Patient denies shortness of breath, fever, cough and chest pain at PAT appointment ? ? ?All instructions explained to the patient, with a verbal understanding of the material. Patient agrees to go over the instructions while at home for a better understanding. Patient also instructed to wear a mask when out in public prior to surgery. The opportunity to ask questions was provided. ?  ?

## 2021-06-14 NOTE — Progress Notes (Addendum)
Anesthesia PAT Evaluation:  ? Case: [id number] Date/Time: 06/21/21 1245  ? Procedure: LEFT BREAST LUMPECTOMY WITH RADIOACTIVE SEED LOCALIZATION (Left: Breast)  ? Anesthesia type: General  ? Pre-op diagnosis: LEFT BREAST DCIS  ? Location: [facility] ROOM 02 / [facility] OR  ? Surgeons: Jovita Kussmaul, MD  ? ?  ? ? ?DISCUSSION: Patient is a 76 year old female scheduled for the above procedure. ? 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 ? 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  In 02/2013, anesthesia records obtained from that date.  Notes indicate that she was intubated with a 7.0 ETT under direct vision with atraumatic laryngoscopy (laryngoscope blade MAC 3) X 1 with grade 1 view.  However, on 09/29/09, she did undergo laryngoscopy micro suspension with left Cymetra injection for dysphonia. She reported that vocal quality improved after the injection. ?  ?Evaluated patient during her PAT RN visit due to elevated blood pressure.  Antihypertensives include losartan 100 mg daily and Toprol XL 50 mg Q AM and 100 mg Q PM.  She had taken morning medications. BP 180/90, 220/110. Manual recheck prior to leaving PAT 186/98 LUE. She denied blurred vision, SOB, chest pain, headaches. She did have brief fluttering which she says is a daily occurrence for her. She already has scheduled follow-up with her PCP on  06/14/21 for HTN follow-up and will discuss. She does have a home BP monitor and says home readings are typically ~ 140's/78.  ? ?Posting suggest she has cardiology preoperative cardiology input, and asked [facility] triage nurse to fax. I also reported to triage nurse BP readings and plans for patient to have PCP follow-up on 06/14/21.   ? ?RSL is scheduled for 06/20/21 at 9:15 AM. ? ?Chart will be left for follow-up. ? ?ADDENDUM 06/15/21 9:40 AM:  ?06/14/21 office note by Mayer Camel, NP reviewed. BP there was much better at 142/88, and she noted that review of office BP trends have been mainly ~ SBP 140's-150's. She is having Mrs. Leyh monitor her BP and home and contact her with results. If significantly elevated then it seems she will consider adding low dose amlodipine.  ? ?ADDENDUM 06/18/21 1:26 PM: ?In regards to perioperative cardiac risk assessment Dr. Otho Perl wrote, "She should be low risk to proceed with the surgery from a cardiology standpoint." It also appears that on 06/15/21, Brown-Patram, [name], NP called in amlodipine 5 mg daily.  ? ?Anesthesia team to evaluate on the day of surgery. She will get vitals on arrival--and would anticipate prior to her RSL placement as well.  ? ? ?VS: BP (!) 180/90 Comment: manual  Pulse 60   Temp 36.8 ?C (Oral)   Resp 18   Ht _0  (1.6 m)   Wt 64.1 kg   SpO2 100%   BMI 25.03 kg/m?   BP recheck was 220/110. I took a manual BP LUE 186/98. DBP 94 on LUE. She reported home BP readings of ~ 140's/78. See DISCUSSION. ? ? ?PROVIDERS: ?Mayer Camel, NP is PCP  ?- Otho Perl,  Marcello Moores, MD is cardiologist Bevelyn Buckles). Previously she was seeing Glenetta Hew, MD, but switched to Dr. Otho Perl due to location. ?- Nicholas Lose, MD is HEM-ON[facility] ?- Kyung Rudd, MD is RAD-ONC ?- Allena Katz, MD is allergist ?- Peri Jefferson, PA-C is endocrinology provider ?Red Christians, MD is nephrologist. Evaluated 2019 for AKI, but no longer requiring routine  follow-up. ? ? ?LABS:  06/06/21 labs done per Dr. Lindi Adie included: ?Lab Results  ?Component Value Date  ? WBC 11.1 (H) 06/06/2021  ? HGB 12.9 06/06/2021  ? HCT 37.5 06/06/2021  ? PLT 301 06/06/2021  ? GLUCOSE 119 (H) 06/06/2021  ? ALT 22 06/06/2021  ? AST 20 06/06/2021  ? NA 138 06/06/2021  ? K 3.8 06/06/2021  ? CL 99 06/06/2021  ? CREATININE 0.83 06/06/2021  ? BUN 9 06/06/2021  ? CO2 28 06/06/2021  ?  ?A1c 6.9% on 05/09/21 ([facility]).  ? ? ?EKG: 06/14/21: NSR ? ? ?CV: ?ZioXT Cardiac monitor 04/30/21-05/14/21 ([facility]):  ?Final interpretation:  ?Rare PACs and PVCs.   ?1 run of wide-complex tachycardia (possible ventricular tachycardia versus SVT with aberrancy (occurred lasting 4 beats with a maximum rate of 203 bpm (average 183 bpm). ?10 supraventricular tachycardia runs occurred, the run with the fastest interval lasting 4 beats with a max rate of 150 bpm.  The longest lasting 11 beats with an average rate of 97 bpm. ?- She reported metoprolol dose increased, and said next step may be to consider a loop recorder in the future if symptoms persist/worsen. ? ? ?Echo 05/27/19 ([facility]): ?SUMMARY  ?Mild left ventricular hypertrophy  ?Left ventricular systolic function is low normal.  ?LV ejection fraction = 50-55%.  ?No old echo for comparison  ? ? ?US Carotid 05/24/19 ([facility]): ?Per 11/03/19 note by Mayer Camel, NP: "She has a history of a retinal hemorrhage as well and there was concern by the ophthalmologist and she had updated carotid Dopplers to ensure that she did not have any significant plaque which she did not." ? ?Cardiac cath 02/22/13: ?Angiographic Findings:  ?1. The left main coronary artery is free of significant atherosclerosis and bifurcates in the usual fashion into the left anterior descending artery and left circumflex coronary artery.  ?2. The left anterior descending artery is a large vessel that reaches the apex and generates 1 major diagonal branch. There is evidence of mild luminal  irregularities, especially in the distal third of the vessel and no calcification. No hemodynamically meaningful stenoses are seen. ?3. The left circumflex coronary artery is a large-size vessel non- dominant vessel that generates only one major oblique marginal artery. There is evidence of minimal luminal irregularities and no calcification. No hemodynamically meaningful stenoses are seen. ?4. The right coronary artery is a large-size dominant vessel that generates a long posterior lateral ventricular system as well as the PDA. There is evidence of minimal luminal irregularities and no calcification. No hemodynamically meaningful stenoses are seen.  ?5. The left ventricle is normal in size. The left ventricle systolic function is moderately decreased with an estimated ejection fraction of 40%. Regional wall motion abnormalities are seen, with a distribution typical for stress cardiomyopathy. There is severe midcavity hypokinesis with normal apical contractility and hyperdynamic base. No left ventricular thrombus is seen. There is no mitral insufficiency. The ascending aorta appears normal. There is no aortic valve stenosis by pullback. The left ventricular end-diastolic pressure is 13 mm Hg.   ? ?IMPRESSIONS:  ?Postoperative stress cardiomyopathy ? ?Past Medical History:  ?Diagnosis Date  ?  Anemia   ? Arthritis   ? Breast cancer ([place])   ? Cardiogenic shock ([place])   ? CKD (chronic kidney disease), stage II   ? Complication of anesthesia   ? 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 Diabetes mellitus ([place])   ? DVT (deep venous thrombosis) ([place]) 03/05/1991  ? "right arm; from IV I had when I had my hysterectomy; on Coumadin for awhile" (02/18/2013)  ? Dyslipidemia, goal LDL below 70   ? Family history of breast cancer 06/06/2021  ? Family history of ovarian cancer 06/06/2021  ? Family history of prostate cancer 06/06/2021  ? Fibromyalgia   ? History of kidney stones   ? Hypertension   ? NASH (nonalcoholic  steatohepatitis)   ? Dr. Percell Miller in [place]  ? Palpitations   ? a. event monitor 07/2013 did not show significant arrhythmia - mostly NSR 70-100, with occasional S Tachy ~100-115, rare PACs & PVCs - not recorded as symptomatic.  ? ST elevation

## 2021-06-15 DIAGNOSIS — D0512 Intraductal carcinoma in situ of left breast: Secondary | ICD-10-CM

## 2021-06-15 NOTE — Research (Signed)
MTG-015 Discovery and Evaluation of Biomarkers for the Diagnosis and Treatment of Disease ?  ?Called and spoke with patient for outstanding data collection. Patient confirmed with two identifiers. ?  ?DATA COLLECTION: Patient reported her most recent COVID vaccination was the [facility] booster on 03/10/20. She tested positive for COVID on 11/30/19 and has not had another COIVD test since. Re-reviewed patient's medication list and obtained start dates. ? ?Patient thanked for her time and continued voluntary participation in this research study. Patient has been provided direct contact information and is encouraged to contact research with any questions or concerns. ? ?Vickii Penna, RN, BSN, CPN ?Clinical Research Nurse I ?[phone] ? ?06/15/2021 10:17 AM ? ?  ? ?

## 2021-06-15 NOTE — Anesthesia Preprocedure Evaluation (Addendum)
Anesthesia Evaluation  ?Patient identified by MRN, date of birth, ID band ?Patient awake ? ? ? ?Reviewed: ?Allergy & Precautions, NPO status , Patient's Chart, lab work & pertinent test results ? ?History of Anesthesia Complications ?Negative for: history of anesthetic complications ? ?Airway ?Mallampati: II ? ?TM Distance: >3 FB ?Neck ROM: Full ? ? ? Dental ? ?(+) Dental Advisory Given, Teeth Intact ?  ?Pulmonary ?neg pulmonary ROS,  ?  ?Pulmonary exam normal ? ? ? ? ? ? ? Cardiovascular ?hypertension, Pt. on medications and Pt. on home beta blockers ?+ Past MI, +CHF (h/o Takotsubo cardiomyopathy 2014) and + DVT  ?Normal cardiovascular exam ? ? ?Echo 05/27/19 ([facility]): ?SUMMARY  ?Mild left ventricular hypertrophy  ?Left ventricular systolic function is low normal.  ?LV ejection fraction = 50-55%.  ?  ?Neuro/Psych ?CVA   ? GI/Hepatic ?negative GI ROS, (+) Hepatitis - ([facility])  ?Endo/Other  ?diabetes, Type 2, Oral Hypoglycemic Agents ? Renal/GU ?CRFRenal disease  ?negative genitourinary ?  ?Musculoskeletal ? ?(+) Fibromyalgia - ? Abdominal ?  ?Peds ? Hematology ?negative hematology ROS ?(+)   ?Anesthesia Other Findings ? ?Left breast DCIS ? 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 Reproductive/Obstetrics ? ?  ? ? ? ? ? ? ? ? ? ? ? ? ? ?  ?  ? ? ? ? ? ? ?Anesthesia Physical ?Anesthesia Plan ? ?ASA: 3 ? ?Anesthesia Plan: General  ? ?Post-op Pain Management: Tylenol PO (pre-op)*, Gabapentin PO (pre-op)* and Celebrex PO (pre-op)*  ? ?Induction: Intravenous ? ?PONV Risk Score and Plan: 3 and Ondansetron, Dexamethasone, Midazolam and Treatment may vary due to age or medical condition ? ?Airway Management Planned: LMA ? ?Additional Equipment: None ? ?Intra-op Plan:  ? ?Post-operative Plan: Extubation in OR ? ?Informed Consent: I have reviewed the patients History and Physical, chart, labs and discussed the procedure including  the risks, benefits and alternatives for the proposed anesthesia with the patient or authorized representative who has indicated his/her understanding and acceptance.  ? ? ? ?Dental advisory given ? ?Plan Discussed with:  ? ?Anesthesia Plan Comments: (PAT note written by Myra Gianotti, PA-C. ?)  ? ? ? ? ? ?Anesthesia Quick Evaluation ? ?

## 2021-06-18 ENCOUNTER — Encounter: Payer: Self-pay | Admitting: Genetic Counselor

## 2021-06-18 ENCOUNTER — Encounter: Payer: Self-pay | Admitting: General Practice

## 2021-06-18 ENCOUNTER — Telehealth: Payer: Self-pay | Admitting: Genetic Counselor

## 2021-06-18 DIAGNOSIS — Z1379 Encounter for other screening for genetic and chromosomal anomalies: Secondary | ICD-10-CM | POA: Insufficient documentation

## 2021-06-18 NOTE — Telephone Encounter (Signed)
Revealed negative genetic testing for BRCAPlus panel.  Discussed that we do not know why she has breast cancer or why there is cancer in the family. It could be sporadic/famillial, due to a different gene that we are not testing, or maybe our current technology may not be able to pick something up.  It will be important for her to keep in contact with genetics to keep up with whether additional testing may be needed.  Pan-cancer panel pending.  ? ? ?

## 2021-06-18 NOTE — Progress Notes (Signed)
? ?Patient Care Team: ?Mayer Camel, NP as PCP - General (Internal Medicine) ?[name], MD as PCP - Cardiology (Cardiology) ?Jovita Kussmaul, MD as Consulting Physician (General Surgery) ?Nicholas Lose, MD as Consulting Physician (Hematology and Oncology) ?Kyung Rudd, MD as Consulting Physician (Radiation Oncology) ?Mauro Kaufmann, RN as Oncology Nurse Navigator ?Rockwell Germany, RN as Oncology Nurse Navigator ? ?DIAGNOSIS:  ?Encounter Diagnosis  ?Name Primary?  ? Ductal carcinoma in situ (DCIS) of left breast   ? ? ?SUMMARY OF ONCOLOGIC HISTORY: ?Oncology History  ?Ductal carcinoma in situ (DCIS) of left breast  ?06/04/2021 Initial Diagnosis  ? Screening mammogram detected left breast mass 0.7 cm by ultrasound, axilla negative, biopsy revealed low-grade to intermediate grade DCIS ER 100%, PR 100% ?  ?06/06/2021 Cancer Staging  ? Staging form: Breast, AJCC 8th Edition ?- Clinical: cT1b, cN0, cM0, G2, ER+, PR+, HER2: Not Assessed - Signed by Nicholas Lose, MD on 06/06/2021 ?Stage prefix: Initial diagnosis ?Histologic grading system: 3 grade system ? ?  ?06/16/2021 Genetic Testing  ? Negative hereditary cancer genetic testing: no pathogenic variants detected in [facility] BRCAPlus Panel or CancerNext-Expanded +RNAinsight Panel.  Report dates are 06/16/2021 and 06/18/2021.  ? ?The BRCAplus panel offered by [employer] and includes sequencing and deletion/duplication analysis for the following 8 genes: ATM, BRCA1, BRCA2, CDH1, CHEK2, PALB2, PTEN, and TP53.  The CancerNext-Expanded gene panel offered by [facility] and includes sequencing, rearrangement, and RNA analysis for the following 77 genes: AIP, ALK, APC, ATM, AXIN2, BAP1, BARD1, BLM, BMPR1A, BRCA1, BRCA2, BRIP1, CDC73, CDH1, CDK4, CDKN1B, CDKN2A, CHEK2, CTNNA1, DICER1, FANCC, FH, FLCN, GALNT12, KIF1B, LZTR1, MAX, MEN1, MET, MLH1, MSH2, MSH3, MSH6, MUTYH, NBN, NF1, NF2, NTHL1, PALB2, PHOX2B, PMS2, POT1, PRKAR1A, PTCH1, PTEN, RAD51C, RAD51D, RB1,  RECQL, RET, SDHA, SDHAF2, SDHB, SDHC, SDHD, SMAD4, SMARCA4, SMARCB1, SMARCE1, STK11, SUFU, TMEM127, TP53, TSC1, TSC2, VHL and XRCC2 (sequencing and deletion/duplication); EGFR, EGLN1, HOXB13, KIT, MITF, PDGFRA, POLD1, and POLE (sequencing only); EPCAM and GREM1 (deletion/duplication only).  ?  ?06/21/2021 Surgery  ? Left lumpectomy: Intermediate grade DCIS 0.9 cm, margins negative, ER 100%, PR 100% ?  ? ? ?CHIEF COMPLIANT: breast cancer left breast mass ? ?INTERVAL HISTORY: Sarah Flores is a 76 y.o. female is here because of recent diagnosis of left breast mass. She presents to the clinic today to discuss the final pathology report.  She does not complain of any pain in the breast.  She has noticed a skin breakdown on her left forearm and she noticed that it is getting infected with some redness and pain and discomfort and recent purulent discharge.  She has been applying topical antibiotic ointments and bandaging herself.  Denies any fevers or chills. ? ? ?ALLERGIES:  is allergic to penicillins and benzoin. ? ?MEDICATIONS:  ?Current Outpatient Medications  ?Medication Sig Dispense Refill  ? cephALEXin (KEFLEX) 500 MG capsule Take 1 capsule (500 mg total) by mouth 2 (two) times daily. 14 capsule 0  ? acetaminophen (TYLENOL) 500 MG tablet Take 1,000 mg by mouth every 6 (six) hours as needed for headache.    ? amLODipine (NORVASC) 5 MG tablet Take 1 tablet by mouth in the morning and at bedtime.    ? aspirin EC 81 MG tablet Take 81 mg by mouth daily.    ? atorvastatin (LIPITOR) 10 MG tablet Take 10 mg by mouth daily.    ? Dulaglutide (TRULICITY) 9.83 JA/2.5KN SOPN Inject 0.75 mg into the skin every Monday.    ? famotidine (  PEPCID) 20 MG tablet Take 1 tablet (20 mg total) by mouth daily in the afternoon. (Patient taking differently: Take 20 mg by mouth 2 (two) times daily.) 90 tablet 3  ? glipiZIDE (GLUCOTROL XL) 10 MG 24 hr tablet Take 10 mg by mouth daily.    ? ipratropium (ATROVENT) 0.06 % nasal spray Place 1-2  sprays into both nostrils every 6 (six) hours as needed for rhinitis.    ? losartan (COZAAR) 100 MG tablet Take 100 mg by mouth daily.    ? metFORMIN (GLUCOPHAGE-XR) 500 MG 24 hr tablet Take 1,000 mg by mouth 2 (two) times a day.    ? metoprolol succinate (TOPROL-XL) 100 MG 24 hr tablet TAKE 1 TABLET DAILY (TAKE WITH OR IMMEDIATELY FOLLOWING A MEAL) (Patient taking differently: Take 50-100 mg by mouth See admin instructions. 67m in the morning and 10108min the evening) 90 tablet 2  ? montelukast (SINGULAIR) 10 MG tablet TAKE 1 TABLET BY MOUTH EVERYDAY AT BEDTIME (Patient taking differently: Take 10 mg by mouth at bedtime.) 30 tablet 10  ? nitroGLYCERIN (NITROSTAT) 0.4 MG SL tablet Place 1 tablet (0.4 mg total) under the tongue every 5 (five) minutes as needed for chest pain. 25 tablet 6  ? oxyCODONE (ROXICODONE) 5 MG immediate release tablet Take 1 tablet (5 mg total) by mouth every 6 (six) hours as needed for severe pain. 10 tablet 0  ? ?No current facility-administered medications for this visit.  ? ? ?PHYSICAL EXAMINATION: ?ECOG PERFORMANCE STATUS: 1 - Symptomatic but completely ambulatory ? ?Vitals:  ? 07/02/21 0917  ?BP: (!) 158/66  ?Pulse: 82  ?Resp: 18  ?Temp: 97.8 ?F (36.6 ?C)  ?SpO2: 98%  ? ?Filed Weights  ? 07/02/21 0917  ?Weight: 144 lb 4.8 oz (65.5 kg)  ? ?  ? ?LABORATORY DATA:  ?I have reviewed the data as listed ? ?  Latest Ref Rng & Units 06/06/2021  ? 12:14 PM 03/14/2020  ? 12:44 PM 03/08/2014  ? 12:31 PM  ?CMP  ?Glucose 70 - 99 mg/dL 119   200   127    ?BUN 8 - 23 mg/dL 9   7   16     ?Creatinine 0.44 - 1.00 mg/dL 0.83   0.81   0.87    ?Sodium 135 - 145 mmol/L 138   136   137    ?Potassium 3.5 - 5.1 mmol/L 3.8   4.0   4.3    ?Chloride 98 - 111 mmol/L 99   98   100    ?CO2 22 - 32 mmol/L 28   25   24     ?Calcium 8.9 - 10.3 mg/dL 7.4   7.6   8.2    ?Total Protein 6.5 - 8.1 g/dL 7.2      ?Total Bilirubin 0.3 - 1.2 mg/dL 1.6      ?Alkaline Phos 38 - 126 U/L 49      ?AST 15 - 41 U/L 20      ?ALT 0 - 44 U/L 22       ? ? ?Lab Results  ?Component Value Date  ? WBC 11.1 (H) 06/06/2021  ? HGB 12.9 06/06/2021  ? HCT 37.5 06/06/2021  ? MCV 95.9 06/06/2021  ? PLT 301 06/06/2021  ? NEUTROABS 5.8 06/06/2021  ? ? ?ASSESSMENT & PLAN:  ?Ductal carcinoma in situ (DCIS) of left breast ?05/21/2021 :Screening mammogram detected left breast mass 0.7 cm by ultrasound, axilla negative, biopsy revealed low-grade to intermediate grade DCIS ER 100%,  PR 100%  ? ?06/21/2021:Left lumpectomy: Intermediate grade DCIS 0.9 cm, margins negative, ER 100%, PR 100% ? ?Pathology counseling: I discussed the final pathology report of the patient provided  a copy of this report. I discussed the margins.  We also discussed the final staging along with previously performed ER/PR testing. ? ?Treatment plan: ?1.  Adjuvant radiation therapy ?2. antiestrogen therapy with tamoxifen daily x5 years ?Patient is interested in taking every advantage of preventative treatments that can lower her risk of breast cancer. ? ?Left arm bruise: Patient says it happened during surgery.  She does been there was pus that came out this morning.  Overall it looks like there is slight redness and tenderness to touch.  We rebandaged it and gave her a prescription for Keflex for a week. ? ?Return to clinic after radiation is complete to start antiestrogens ? ? ? ?No orders of the defined types were placed in this encounter. ? ?The patient has a good understanding of the overall plan. she agrees with it. she will call with any problems that may develop before the next visit here. ?Total time spent: 30 mins including face to face time and time spent for planning, charting and co-ordination of care ? ? Harriette Ohara, MD ?07/02/21 ? ? ? I Gardiner Coins am scribing for Dr. Lindi Adie ? ?I have reviewed the above documentation for accuracy and completeness, and I agree with the above. ?  ?

## 2021-06-18 NOTE — Progress Notes (Signed)
[facility]ual Care Note ? ?[name] by phone for follow-up support. She was in good spirits overall and reports feeling confident in her decision to have lumpectomy and radiation. Prayer support is very meaningful to her. She reports no other needs at this time, but is aware of ongoing chaplain availability in case she would like to connect again later in her treatment process. ? ? ?Chaplain Lorrin Jackson, MDiv, [facility] ?Pager [phone] ?Voicemail [phone]  ?

## 2021-06-19 ENCOUNTER — Encounter: Payer: Self-pay | Admitting: *Deleted

## 2021-06-19 NOTE — Progress Notes (Signed)
Received call from pt PCP stating Amlodipine 5 mg BID has been added to her medication list due to ongoing hypertension. Updated medication list.  ?

## 2021-06-20 ENCOUNTER — Inpatient Hospital Stay
Admission: RE | Admit: 2021-06-20 | Discharge: 2021-06-20 | Disposition: A | Discharge status: Home / Self Care | Payer: Self-pay | Source: Ambulatory Visit | Attending: Radiation Oncology | Admitting: Radiation Oncology

## 2021-06-20 ENCOUNTER — Ambulatory Visit
Admission: RE | Admit: 2021-06-20 | Discharge: 2021-06-20 | Disposition: A | Discharge status: Home / Self Care | Payer: Self-pay | Source: Ambulatory Visit | Attending: Radiation Oncology | Admitting: Radiation Oncology

## 2021-06-20 ENCOUNTER — Other Ambulatory Visit: Payer: Self-pay | Admitting: Radiation Oncology

## 2021-06-20 DIAGNOSIS — D0512 Intraductal carcinoma in situ of left breast: Secondary | ICD-10-CM

## 2021-06-21 ENCOUNTER — Ambulatory Visit (HOSPITAL_BASED_OUTPATIENT_CLINIC_OR_DEPARTMENT_OTHER): Payer: Medicare Other | Admitting: Anesthesiology

## 2021-06-21 ENCOUNTER — Encounter (HOSPITAL_COMMUNITY): Payer: Self-pay | Admitting: General Surgery

## 2021-06-21 ENCOUNTER — Ambulatory Visit (HOSPITAL_COMMUNITY)
Admission: RE | Admit: 2021-06-21 | Discharge: 2021-06-21 | Disposition: A | Discharge status: Home / Self Care | Payer: Medicare Other | Attending: General Surgery | Admitting: General Surgery

## 2021-06-21 ENCOUNTER — Encounter (HOSPITAL_COMMUNITY)
Admission: RE | Disposition: A | Discharge status: Home / Self Care | Payer: Self-pay | Source: Home / Self Care | Attending: General Surgery

## 2021-06-21 ENCOUNTER — Other Ambulatory Visit: Payer: Self-pay

## 2021-06-21 ENCOUNTER — Ambulatory Visit (HOSPITAL_COMMUNITY): Payer: Medicare Other | Admitting: Vascular Surgery

## 2021-06-21 DIAGNOSIS — D0512 Intraductal carcinoma in situ of left breast: Secondary | ICD-10-CM | POA: Diagnosis present

## 2021-06-21 DIAGNOSIS — I130 Hypertensive heart and chronic kidney disease with heart failure and stage 1 through stage 4 chronic kidney disease, or unspecified chronic kidney disease: Secondary | ICD-10-CM

## 2021-06-21 DIAGNOSIS — N189 Chronic kidney disease, unspecified: Secondary | ICD-10-CM | POA: Diagnosis not present

## 2021-06-21 DIAGNOSIS — I509 Heart failure, unspecified: Secondary | ICD-10-CM

## 2021-06-21 DIAGNOSIS — I5181 Takotsubo syndrome: Secondary | ICD-10-CM | POA: Insufficient documentation

## 2021-06-21 DIAGNOSIS — Z8673 Personal history of transient ischemic attack (TIA), and cerebral infarction without residual deficits: Secondary | ICD-10-CM | POA: Insufficient documentation

## 2021-06-21 DIAGNOSIS — I252 Old myocardial infarction: Secondary | ICD-10-CM | POA: Insufficient documentation

## 2021-06-21 DIAGNOSIS — I129 Hypertensive chronic kidney disease with stage 1 through stage 4 chronic kidney disease, or unspecified chronic kidney disease: Secondary | ICD-10-CM | POA: Diagnosis not present

## 2021-06-21 DIAGNOSIS — E1122 Type 2 diabetes mellitus with diabetic chronic kidney disease: Secondary | ICD-10-CM | POA: Insufficient documentation

## 2021-06-21 DIAGNOSIS — Z86718 Personal history of other venous thrombosis and embolism: Secondary | ICD-10-CM | POA: Diagnosis not present

## 2021-06-21 HISTORY — PX: BREAST LUMPECTOMY WITH RADIOACTIVE SEED LOCALIZATION: SHX6424

## 2021-06-21 LAB — GLUCOSE, CAPILLARY
Glucose-Capillary: 153 mg/dL — ABNORMAL HIGH (ref 70–99)
Glucose-Capillary: 143 mg/dL — ABNORMAL HIGH (ref 70–99)
Glucose-Capillary: 104 mg/dL — ABNORMAL HIGH (ref 70–99)

## 2021-06-21 SURGERY — BREAST LUMPECTOMY WITH RADIOACTIVE SEED LOCALIZATION
Anesthesia: General | Site: Breast | Laterality: Left

## 2021-06-21 MED ORDER — CHLORHEXIDINE GLUCONATE CLOTH 2 % EX PADS
6.0000 | MEDICATED_PAD | Freq: Once | CUTANEOUS | Status: DC
Start: 2021-06-21 — End: 2021-06-23

## 2021-06-21 MED ORDER — ONDANSETRON HCL 4 MG/2ML IJ SOLN
INTRAMUSCULAR | Status: DC | PRN
  Administered 2021-06-21: 4 mg via INTRAVENOUS

## 2021-06-21 MED ORDER — PHENYLEPHRINE HCL-NACL 20-0.9 MG/250ML-% IV SOLN
INTRAVENOUS | Status: DC | PRN
  Administered 2021-06-21: 40 ug/min via INTRAVENOUS

## 2021-06-21 MED ORDER — OXYCODONE HCL 5 MG/5ML PO SOLN: 5.0000 mg | Freq: Once | ORAL | Status: DC | PRN

## 2021-06-21 MED ORDER — OXYCODONE HCL 5 MG PO TABS: 5.0000 mg | ORAL_TABLET | Freq: Four times a day (QID) | ORAL | 0 refills | Status: DC | PRN

## 2021-06-21 MED ORDER — BUPIVACAINE-EPINEPHRINE 0.25% -1:200000 IJ SOLN
INTRAMUSCULAR | Status: DC | PRN
  Administered 2021-06-21: 20 mL

## 2021-06-21 MED ORDER — INSULIN ASPART 100 UNIT/ML IJ SOLN: 0.0000 [IU] | INTRAMUSCULAR | Status: DC | PRN

## 2021-06-21 MED ORDER — LIDOCAINE 2% (20 MG/ML) 5 ML SYRINGE
INTRAMUSCULAR | Status: AC
  Filled 2021-06-21: qty 5

## 2021-06-21 MED ORDER — ONDANSETRON HCL 4 MG/2ML IJ SOLN: 4.0000 mg | Freq: Once | INTRAMUSCULAR | Status: DC | PRN

## 2021-06-21 MED ORDER — LIDOCAINE 2% (20 MG/ML) 5 ML SYRINGE
INTRAMUSCULAR | Status: DC | PRN
Start: 2021-06-21 — End: 2021-06-21
  Administered 2021-06-21: 80 mg via INTRAVENOUS

## 2021-06-21 MED ORDER — FENTANYL CITRATE (PF) 100 MCG/2ML IJ SOLN: 25.0000 ug | INTRAMUSCULAR | Status: DC | PRN

## 2021-06-21 MED ORDER — PROPOFOL 10 MG/ML IV BOLUS
INTRAVENOUS | Status: DC | PRN
  Administered 2021-06-21: 150 mg via INTRAVENOUS

## 2021-06-21 MED ORDER — FENTANYL CITRATE (PF) 250 MCG/5ML IJ SOLN
INTRAMUSCULAR | Status: AC
  Filled 2021-06-21: qty 5

## 2021-06-21 MED ORDER — ORAL CARE MOUTH RINSE: 15.0000 mL | Freq: Once | OROMUCOSAL | Status: AC

## 2021-06-21 MED ORDER — 0.9 % SODIUM CHLORIDE (POUR BTL) OPTIME
TOPICAL | Status: DC | PRN
  Administered 2021-06-21: 1000 mL

## 2021-06-21 MED ORDER — LACTATED RINGERS IV SOLN
INTRAVENOUS | Status: DC
Start: 2021-06-21 — End: 2021-06-23

## 2021-06-21 MED ORDER — GABAPENTIN 300 MG PO CAPS
300.0000 mg | ORAL_CAPSULE | ORAL | Status: AC
  Administered 2021-06-21: 300 mg via ORAL
  Filled 2021-06-21: qty 1

## 2021-06-21 MED ORDER — CHLORHEXIDINE GLUCONATE 0.12 % MT SOLN
15.0000 mL | Freq: Once | OROMUCOSAL | Status: AC
  Administered 2021-06-21: 15 mL via OROMUCOSAL
  Filled 2021-06-21: qty 15

## 2021-06-21 MED ORDER — VANCOMYCIN HCL IN DEXTROSE 1-5 GM/200ML-% IV SOLN
1000.0000 mg | INTRAVENOUS | Status: AC
  Administered 2021-06-21: 1000 mg via INTRAVENOUS
  Filled 2021-06-21: qty 200

## 2021-06-21 MED ORDER — BUPIVACAINE-EPINEPHRINE (PF) 0.25% -1:200000 IJ SOLN
INTRAMUSCULAR | Status: AC
  Filled 2021-06-21: qty 30

## 2021-06-21 MED ORDER — OXYCODONE HCL 5 MG PO TABS: 5.0000 mg | ORAL_TABLET | Freq: Once | ORAL | Status: DC | PRN

## 2021-06-21 MED ORDER — AMISULPRIDE (ANTIEMETIC) 5 MG/2ML IV SOLN: 10.0000 mg | Freq: Once | INTRAVENOUS | Status: DC | PRN

## 2021-06-21 MED ORDER — CELECOXIB 200 MG PO CAPS
200.0000 mg | ORAL_CAPSULE | ORAL | Status: AC
  Administered 2021-06-21: 200 mg via ORAL
  Filled 2021-06-21: qty 1

## 2021-06-21 MED ORDER — ACETAMINOPHEN 500 MG PO TABS
1000.0000 mg | ORAL_TABLET | ORAL | Status: AC
  Administered 2021-06-21: 1000 mg via ORAL
  Filled 2021-06-21: qty 2

## 2021-06-21 MED ORDER — CHLORHEXIDINE GLUCONATE CLOTH 2 % EX PADS: 6.0000 | MEDICATED_PAD | Freq: Once | CUTANEOUS | Status: DC

## 2021-06-21 MED ORDER — DEXAMETHASONE SODIUM PHOSPHATE 10 MG/ML IJ SOLN
INTRAMUSCULAR | Status: DC | PRN
  Administered 2021-06-21: 4 mg via INTRAVENOUS

## 2021-06-21 MED ORDER — FENTANYL CITRATE (PF) 100 MCG/2ML IJ SOLN
INTRAMUSCULAR | Status: DC | PRN
Start: 2021-06-21 — End: 2021-06-21
  Administered 2021-06-21: 100 ug via INTRAVENOUS

## 2021-06-21 SURGICAL SUPPLY — 36 items
ADH SKN CLS APL DERMABOND .7 (GAUZE/BANDAGES/DRESSINGS) ×1
APL PRP STRL LF DISP 70% ISPRP (MISCELLANEOUS) ×1
APPLIER CLIP 9.375 MED OPEN (MISCELLANEOUS) ×2
APR CLP MED 9.3 20 MLT OPN (MISCELLANEOUS) ×1
BAG COUNTER SPONGE SURGICOUNT (BAG) IMPLANT
BAG SPNG CNTER NS LX DISP (BAG)
BINDER BREAST LRG (GAUZE/BANDAGES/DRESSINGS) IMPLANT
BINDER BREAST XLRG (GAUZE/BANDAGES/DRESSINGS) IMPLANT
CANISTER SUCT 3000ML PPV (MISCELLANEOUS) ×2 IMPLANT
CHLORAPREP W/TINT 26 (MISCELLANEOUS) ×2 IMPLANT
CLIP APPLIE 9.375 MED OPEN (MISCELLANEOUS) IMPLANT
COVER PROBE W GEL 5X96 (DRAPES) ×2 IMPLANT
COVER SURGICAL LIGHT HANDLE (MISCELLANEOUS) ×2 IMPLANT
DERMABOND ADVANCED (GAUZE/BANDAGES/DRESSINGS) ×1
DERMABOND ADVANCED .7 DNX12 (GAUZE/BANDAGES/DRESSINGS) ×1 IMPLANT
DEVICE DUBIN SPECIMEN MAMMOGRA (MISCELLANEOUS) ×2 IMPLANT
DRAPE CHEST BREAST 15X10 FENES (DRAPES) ×2 IMPLANT
ELECT COATED BLADE 2.86 ST (ELECTRODE) ×2 IMPLANT
ELECT REM PT RETURN 9FT ADLT (ELECTROSURGICAL) ×2
ELECTRODE REM PT RTRN 9FT ADLT (ELECTROSURGICAL) ×1 IMPLANT
GLOVE BIO SURGEON STRL SZ7.5 (GLOVE) ×3 IMPLANT
GOWN STRL REUS W/ TWL LRG LVL3 (GOWN DISPOSABLE) ×2 IMPLANT
GOWN STRL REUS W/TWL LRG LVL3 (GOWN DISPOSABLE) ×4
KIT BASIN OR (CUSTOM PROCEDURE TRAY) ×2 IMPLANT
KIT MARKER MARGIN INK (KITS) ×2 IMPLANT
LIGHT WAVEGUIDE WIDE FLAT (MISCELLANEOUS) IMPLANT
NDL HYPO 25GX1X1/2 BEV (NEEDLE) ×1 IMPLANT
NEEDLE HYPO 25GX1X1/2 BEV (NEEDLE) ×2 IMPLANT
NS IRRIG 1000ML POUR BTL (IV SOLUTION) ×2 IMPLANT
PACK GENERAL/GYN (CUSTOM PROCEDURE TRAY) ×2 IMPLANT
SUT MNCRL AB 4-0 PS2 18 (SUTURE) ×2 IMPLANT
SUT SILK 2 0 SH (SUTURE) IMPLANT
SUT VIC AB 3-0 SH 18 (SUTURE) ×2 IMPLANT
SYR CONTROL 10ML LL (SYRINGE) ×2 IMPLANT
TOWEL GREEN STERILE (TOWEL DISPOSABLE) ×2 IMPLANT
TOWEL GREEN STERILE FF (TOWEL DISPOSABLE) ×2 IMPLANT

## 2021-06-21 NOTE — Interval H&P Note (Signed)
History and Physical Interval Note: ? ?06/21/2021 ?1:00 PM ? ?Sarah Flores  has presented today for surgery, with the diagnosis of LEFT BREAST DCIS.  The various methods of treatment have been discussed with the patient and family. After consideration of risks, benefits and other options for treatment, the patient has consented to  Procedure(s): ?LEFT BREAST LUMPECTOMY WITH RADIOACTIVE SEED LOCALIZATION (Left) as a surgical intervention.  The patient's history has been reviewed, patient examined, no change in status, stable for surgery.  I have reviewed the patient's chart and labs.  Questions were answered to the patient's satisfaction.   ? ? ?Autumn Messing III ? ? ?

## 2021-06-21 NOTE — Op Note (Signed)
06/21/2021 ? ?2:20 PM ? ?PATIENT:  Sarah Flores  76 y.o. female ? ?PRE-OPERATIVE DIAGNOSIS:  LEFT BREAST DCIS ? ?POST-OPERATIVE DIAGNOSIS:  LEFT BREAST DCIS ? ?PROCEDURE:  Procedure(s): ?LEFT BREAST LUMPECTOMY WITH RADIOACTIVE SEED LOCALIZATION (Left) ? ?SURGEON:  Surgeon(s) and Role: ?   Jovita Kussmaul, MD - Primary ? ?PHYSICIAN ASSISTANT:  ? ?ASSISTANTS: none  ? ?ANESTHESIA:   local and general ? ?EBL:  20 mL  ? ?BLOOD ADMINISTERED:none ? ?DRAINS: none  ? ?LOCAL MEDICATIONS USED:  MARCAINE    ? ?SPECIMEN:  Source of Specimen:  left breast tissue ? ?DISPOSITION OF SPECIMEN:  PATHOLOGY ? ?COUNTS:  YES ? ?TOURNIQUET:  * No tourniquets in log * ? ?DICTATION: .Dragon Dictation ? ?After informed consent was obtained patient was brought to the operating room and placed in the supine position on the operating table.  After adequate induction of general anesthesia the patient's left breast was prepped with ChloraPrep, allowed to dry, and draped in usual sterile manner.  An appropriate timeout was performed.  Previously an I-125 seed was placed in the lower inner central left breast to mark an area of ductal carcinoma in situ.  The neoprobe was set to I-125 in the area of radioactivity was readily identified.  The area around this was infiltrated with quarter percent Marcaine.  A curvilinear incision was made along the lower inner edge of the areola of the left breast with a 15 blade knife.  The incision was carried through the skin and subcutaneous tissue sharply with the electrocautery.  Dissection was carried towards the radioactive seed under the direction of the neoprobe.  Once I more closely approached the radioactive seed I then removed a circular portion of breast tissue sharply with the electrocautery around the radioactive seed while checking the area of radioactivity frequently.  Once the specimen was removed it was oriented with the appropriate paint colors.  A specimen radiograph was obtained that showed  the clip and seed to be near the center of the specimen.  The specimen was then sent to pathology for further evaluation.  Hemostasis was achieved using the Bovie electrocautery.  The wound was irrigated with saline and infiltrated with more half percent Marcaine.  The cavity was marked with clips.  The deep layer of the wound was then closed with layers of interrupted 3-0 Vicryl stitches.  The skin was then closed with interrupted 4-0 Monocryl subcuticular stitches.  Dermabond dressings were applied.  The patient tolerated the procedure well.  At the end of the case all needle sponge and instrument counts were correct.  The patient was then awakened and taken to recovery in stable condition. ? ?PLAN OF CARE: Discharge to home after PACU ? ?PATIENT DISPOSITION:  PACU - hemodynamically stable. ?  ?Delay start of Pharmacological VTE agent (>24hrs) due to surgical blood loss or risk of bleeding: not applicable ? ?

## 2021-06-21 NOTE — Transfer of Care (Signed)
Immediate Anesthesia Transfer of Care Note ? ?Patient: Sarah Flores ? ?Procedure(s) Performed: LEFT BREAST LUMPECTOMY WITH RADIOACTIVE SEED LOCALIZATION (Left: Breast) ? ?Patient Location: PACU ? ?Anesthesia Type:General ? ?Level of Consciousness: awake, alert  and oriented ? ?Airway & Oxygen Therapy: Patient Spontanous Breathing and Patient connected to nasal cannula oxygen ? ?Post-op Assessment: Report given to RN, Post -op Vital signs reviewed and stable and Patient moving all extremities ? ?Post vital signs: Reviewed and stable ? ?Last Vitals:  ?Vitals Value Taken Time  ?BP 170/95 06/21/21 1431  ?Temp    ?Pulse 71 06/21/21 1433  ?Resp 14 06/21/21 1433  ?SpO2 93 % 06/21/21 1433  ?Vitals shown include unvalidated device data. ? ?Last Pain:  ?Vitals:  ? 06/21/21 1129  ?TempSrc:   ?PainSc: 0-No pain  ?   ? ?  ? ?Complications: No notable events documented. ?

## 2021-06-21 NOTE — H&P (Signed)
?REFERRING PHYSICIAN: Rulon Eisenmenger, MD ? ?PROVIDER: Landry Corporal, MD ? ?MRN: [id number] ?DOB: [birth year] ?Subjective  ? ?Chief Complaint: Breast Cancer ? ? ?History of Present Illness: ?Sarah Flores is a 76 y.o. female who is seen today as an office consultation for evaluation of Breast Cancer ?.  ? ?We are asked to see the patient in consultation by Dr. Lindi Adie to evaluate her for a new left breast cancer. The patient is a 76 year old white female who recently went for a routine screening mammogram. At that time she was found to have a mass in the lower inner quadrant of the left breast that measured 7 mm. The axilla was negative. The mass was biopsied and came back as intermediate grade DCIS that was ER and PR positive. She has no family history of breast cancer and does not smoke. She did have a myocardial infarction 10 years ago and is followed by Dr. Otho Perl in [place]. She also wants [place] to know that she is allergic to benzoin ? ?Review of Systems: ?A complete review of systems was obtained from the patient. I have reviewed this information and discussed as appropriate with the patient. See HPI as well for other ROS. ? ?ROS  ? ?Medical History: ?Past Medical History:  ?Diagnosis Date  ? Arthritis  ? Chronic kidney disease  ? Diabetes mellitus without complication (CMS-HCC)  ? DVT (deep venous thrombosis) (CMS-HCC)  ? History of cancer  ? History of stroke  ? Hyperlipidemia  ? Hypertension  ? Liver disease  ? ?Patient Active Problem List  ?Diagnosis  ? Degeneration of lumbar or lumbosacral intervertebral disc  ? Ductal carcinoma in situ (DCIS) of left breast  ? ?Past Surgical History:  ?Procedure Laterality Date  ? APPENDECTOMY  ? CHOLECYSTECTOMY  ? HYSTERECTOMY  ? PARATHYROIDECTOMY N/A  ? TONSILLECTOMY N/A  ? ? ?Allergies  ?Allergen Reactions  ? Penicillins Anaphylaxis  ? Benzoin Other (See Comments)  ?External tincture ?Burning - "looked like a 2nd degree burn" ?External tincture ? ? ?Current Outpatient  Medications on File Prior to Visit  ?Medication Sig Dispense Refill  ? aspirin 81 MG EC tablet Take 81 mg by mouth once daily  ? atorvastatin (LIPITOR) 10 MG tablet Take 10 mg by mouth once daily  ? famotidine (PEPCID) 20 MG tablet Take 20 mg by mouth as directed Take 1 tablet ('20mg'$ ) by mouth daily in the afternoon  ? gabapentin (NEURONTIN) 100 MG capsule Take 1 capsule by mouth 3 (three) times daily  ? glipiZIDE (GLUCOTROL XL) 10 MG XL tablet Take 10 mg by mouth 2 (two) times daily  ? ipratropium (ATROVENT) 0.06 % nasal spray Place 1-2 sprays into both nostrils every 6 (six) hours  ? losartan (COZAAR) 100 MG tablet Take 1 tablet by mouth once daily  ? metFORMIN (GLUCOPHAGE-XR) 500 MG XR tablet Take 2 tablets by mouth 2 (two) times daily  ? metoprolol succinate (TOPROL-XL) 100 MG XL tablet Take 150 mg by mouth once daily Take 1/2 tab ('50mg'$ ) in the morning and 1 whole tab ('100mg'$ ) in the evening  ? metoprolol tartrate (LOPRESSOR) 25 MG tablet Take 25 mg by mouth as directed  ? montelukast (SINGULAIR) 10 mg tablet Take 10 mg by mouth at bedtime  ? ONETOUCH VERIO TEST STRIPS test strip 1 strip 2 (two) times daily  ? TRULICITY 2.72 ZD/6.6 mL subcutaneous pen injector Inject 0.5 mLs subcutaneously once a week  ? ?No current facility-administered medications on file prior to visit.  ? ?Family History  ?  Problem Relation Age of Onset  ? Coronary Artery Disease (Blocked arteries around heart) Mother  ? High blood pressure (Hypertension) Mother  ? Obesity Mother  ? Deep vein thrombosis (DVT or abnormal blood clot formation) Father  ? Diabetes Father  ? Coronary Artery Disease (Blocked arteries around heart) Father  ? High blood pressure (Hypertension) Father  ? Obesity Father  ? Diabetes Brother  ? Coronary Artery Disease (Blocked arteries around heart) Brother  ? High blood pressure (Hypertension) Brother  ? Obesity Brother  ? ? ?Social History  ? ?Tobacco Use  ?Smoking Status Never  ?Smokeless Tobacco Never  ? ? ?Social  History  ? ?Socioeconomic History  ? Marital status: Married  ?Tobacco Use  ? Smoking status: Never  ? Smokeless tobacco: Never  ?Vaping Use  ? Vaping Use: Never used  ?Substance and Sexual Activity  ? Alcohol use: Never  ? Drug use: Never  ? ?Objective:  ?There were no vitals filed for this visit.  ?There is no height or weight on file to calculate BMI. ? ?Physical Exam ?Vitals reviewed.  ?Constitutional:  ?General: She is not in acute distress. ?Appearance: Normal appearance.  ?HENT:  ?Head: Normocephalic and atraumatic.  ?Right Ear: External ear normal.  ?Left Ear: External ear normal.  ?Nose: Nose normal.  ?Mouth/Throat:  ?Mouth: Mucous membranes are moist.  ?Pharynx: Oropharynx is clear.  ?Eyes:  ?General: No scleral icterus. ?Extraocular Movements: Extraocular movements intact.  ?Conjunctiva/sclera: Conjunctivae normal.  ?Pupils: Pupils are equal, round, and reactive to light.  ?Cardiovascular:  ?Rate and Rhythm: Normal rate and regular rhythm.  ?Pulses: Normal pulses.  ?Heart sounds: Normal heart sounds.  ?Pulmonary:  ?Effort: Pulmonary effort is normal. No respiratory distress.  ?Breath sounds: Normal breath sounds.  ?Abdominal:  ?General: Bowel sounds are normal.  ?Palpations: Abdomen is soft.  ?Tenderness: There is no abdominal tenderness.  ?Musculoskeletal:  ?General: No swelling, tenderness or deformity. Normal range of motion.  ?Cervical back: Normal range of motion and neck supple.  ?Skin: ?General: Skin is warm and dry.  ?Coloration: Skin is not jaundiced.  ?Neurological:  ?General: No focal deficit present.  ?Mental Status: She is alert and oriented to person, place, and time.  ?Psychiatric:  ?Mood and Affect: Mood normal.  ?Behavior: Behavior normal.  ? ? ? ?Breast: There is no palpable mass in either breast. There is no palpable axillary, supraclavicular, or cervical lymphadenopathy. ? ?Labs, Imaging and Diagnostic Testing: ? ?Assessment and Plan:  ? ?Diagnoses and all orders for this  visit: ? ?Ductal carcinoma in situ (DCIS) of left breast ?- [facility] Case Posting Request; Future ? ? ? ?The patient appears to have a 7 mm area of ductal carcinoma in situ in the lower inner quadrant of the left breast. I have discussed with her in detail the different options for treatment and at this point she favors breast conservation which I feel is very reasonable. Given that it is DCIS and her age she will not need a node evaluation. I have discussed with her in detail the risks and benefits of the operation as well as some of the technical aspects including the use of a radioactive seed for localization and she understands and wishes to proceed. She will meet with medical and radiation oncology to discuss adjuvant therapy. We will obtain cardiac clearance prior to scheduling surgery. ?

## 2021-06-22 ENCOUNTER — Encounter (HOSPITAL_COMMUNITY): Payer: Self-pay | Admitting: General Surgery

## 2021-06-22 NOTE — Anesthesia Postprocedure Evaluation (Signed)
Anesthesia Post Note ? ?Patient: Sarah Flores ? ?Procedure(s) Performed: LEFT BREAST LUMPECTOMY WITH RADIOACTIVE SEED LOCALIZATION (Left: Breast) ? ?  ? ?Patient location during evaluation: PACU ?Anesthesia Type: General ?Level of consciousness: awake ?Pain management: pain level controlled ?Vital Signs Assessment: post-procedure vital signs reviewed and stable ?Respiratory status: spontaneous breathing and respiratory function stable ?Cardiovascular status: stable ?Postop Assessment: no apparent nausea or vomiting ?Anesthetic complications: no ? ? ?No notable events documented. ? ?Last Vitals:  ?Vitals:  ? 06/21/21 1445 06/21/21 1500  ?BP: (!) 160/70 (!) 158/71  ?Pulse: 68 71  ?Resp: 11 11  ?Temp:  36.5 ?C  ?SpO2: 90% 94%  ?  ?Last Pain:  ?Vitals:  ? 06/21/21 1129  ?TempSrc:   ?PainSc: 0-No pain  ? ? ?  ?  ?  ?  ?  ?  ? ?Merlinda Frederick ? ? ? ? ?

## 2021-06-25 ENCOUNTER — Encounter: Payer: Self-pay | Admitting: *Deleted

## 2021-06-26 ENCOUNTER — Ambulatory Visit: Payer: Self-pay | Admitting: Genetic Counselor

## 2021-06-26 ENCOUNTER — Telehealth: Payer: Self-pay | Admitting: Genetic Counselor

## 2021-06-26 ENCOUNTER — Encounter: Payer: Self-pay | Admitting: *Deleted

## 2021-06-26 DIAGNOSIS — Z8042 Family history of malignant neoplasm of prostate: Secondary | ICD-10-CM

## 2021-06-26 DIAGNOSIS — D0512 Intraductal carcinoma in situ of left breast: Secondary | ICD-10-CM

## 2021-06-26 DIAGNOSIS — Z1379 Encounter for other screening for genetic and chromosomal anomalies: Secondary | ICD-10-CM

## 2021-06-26 DIAGNOSIS — Z803 Family history of malignant neoplasm of breast: Secondary | ICD-10-CM

## 2021-06-26 DIAGNOSIS — Z8041 Family history of malignant neoplasm of ovary: Secondary | ICD-10-CM

## 2021-06-26 LAB — SURGICAL PATHOLOGY

## 2021-06-26 NOTE — Telephone Encounter (Signed)
Revealed negative pan-cancer genetics panel.  ? ? ?

## 2021-06-26 NOTE — Progress Notes (Signed)
HPI:   ?Sarah Flores was previously seen in the [facility] clinic due to a personal and family history of cancer and concerns regarding a hereditary predisposition to cancer. Please refer to our prior cancer genetics clinic note for more information regarding our discussion, assessment and recommendations, at the time. Sarah Flores recent genetic test results were disclosed to her, as were recommendations warranted by these results. These results and recommendations are discussed in more detail below. ? ?CANCER HISTORY:  ?Oncology History  ?Ductal carcinoma in situ (DCIS) of left breast  ?06/04/2021 Initial Diagnosis  ? Screening mammogram detected left breast mass 0.7 cm by ultrasound, axilla negative, biopsy revealed low-grade to intermediate grade DCIS ER 100%, PR 100% ?  ?06/06/2021 Cancer Staging  ? Staging form: Breast, AJCC 8th Edition ?- Clinical: cT1b, cN0, cM0, G2, ER+, PR+, HER2: Not Assessed - Signed by Nicholas Lose, MD on 06/06/2021 ?Stage prefix: Initial diagnosis ?Histologic grading system: 3 grade system ? ?  ?06/16/2021 Genetic Testing  ? Negative hereditary cancer genetic testing: no pathogenic variants detected in [facility] BRCAPlus Panel or CancerNext-Expanded +RNAinsight Panel.  Report dates are 06/16/2021 and 06/18/2021.  ? ?The BRCAplus panel offered by [employer] and includes sequencing and deletion/duplication analysis for the following 8 genes: ATM, BRCA1, BRCA2, CDH1, CHEK2, PALB2, PTEN, and TP53.  The CancerNext-Expanded gene panel offered by [facility] and includes sequencing, rearrangement, and RNA analysis for the following 77 genes: AIP, ALK, APC, ATM, AXIN2, BAP1, BARD1, BLM, BMPR1A, BRCA1, BRCA2, BRIP1, CDC73, CDH1, CDK4, CDKN1B, CDKN2A, CHEK2, CTNNA1, DICER1, FANCC, FH, FLCN, GALNT12, KIF1B, LZTR1, MAX, MEN1, MET, MLH1, MSH2, MSH3, MSH6, MUTYH, NBN, NF1, NF2, NTHL1, PALB2, PHOX2B, PMS2, POT1, PRKAR1A, PTCH1, PTEN, RAD51C, RAD51D, RB1, RECQL, RET, SDHA, SDHAF2, SDHB, SDHC,  SDHD, SMAD4, SMARCA4, SMARCB1, SMARCE1, STK11, SUFU, TMEM127, TP53, TSC1, TSC2, VHL and XRCC2 (sequencing and deletion/duplication); EGFR, EGLN1, HOXB13, KIT, MITF, PDGFRA, POLD1, and POLE (sequencing only); EPCAM and GREM1 (deletion/duplication only).  ?  ? ? ?FAMILY HISTORY:  ?We obtained a detailed, 4-generation family history.  Significant diagnoses are listed below: ?     ?Family History  ?Problem Relation Age of Onset  ? Lung cancer Mother 58  ? Leukemia Father 59  ? Prostate cancer Father 39  ?      metastatic  ? Ovarian cancer Maternal Aunt    ?      dx after 50  ? Cancer Maternal Aunt    ?      unknown type; ? lung; dx after 50  ? Colon cancer Maternal Uncle    ?      dx after 50  ? Ovarian cancer Paternal Aunt    ?      dx after 14  ? Brain cancer Maternal Grandmother    ?      dx mid 18s  ? Breast cancer Cousin    ?      paternal female first cousins; dx 48s; x3 cousins  ?  ?  ?Sarah Flores is unaware of previous family history of genetic testing for hereditary cancer risks.  There is no reported Ashkenazi Jewish ancestry. There is no known consanguinity. ? ? ?GENETIC TEST RESULTS:  ?The [facility] CancerNext-Expanded +RNAinsight Panel found no pathogenic mutations. The CancerNext-Expanded gene panel offered by [facility] and includes sequencing, rearrangement, and RNA analysis for the following 77 genes: AIP, ALK, APC, ATM, AXIN2, BAP1, BARD1, BLM, BMPR1A, BRCA1, BRCA2, BRIP1, CDC73, CDH1, CDK4, CDKN1B, CDKN2A, CHEK2, CTNNA1, DICER1, FANCC, FH,  FLCN, GALNT12, KIF1B, LZTR1, MAX, MEN1, MET, MLH1, MSH2, MSH3, MSH6, MUTYH, NBN, NF1, NF2, NTHL1, PALB2, PHOX2B, PMS2, POT1, PRKAR1A, PTCH1, PTEN, RAD51C, RAD51D, RB1, RECQL, RET, SDHA, SDHAF2, SDHB, SDHC, SDHD, SMAD4, SMARCA4, SMARCB1, SMARCE1, STK11, SUFU, TMEM127, TP53, TSC1, TSC2, VHL and XRCC2 (sequencing and deletion/duplication); EGFR, EGLN1, HOXB13, KIT, MITF, PDGFRA, POLD1, and POLE (sequencing only); EPCAM and GREM1 (deletion/duplication only). .  ? ?The  test report has been scanned into EPIC and is located under the Molecular Pathology section of the Results Review tab.  A portion of the result report is included below for reference. Genetic testing reported out on June 18, 2021.  ? ? ? ?Even though a pathogenic variant was not identified, possible explanations for the cancer in the family may include: ?There may be no hereditary risk for cancer in the family. The cancers in Sarah Flores and/or her family may be sporadic/familial or due to other genetic and environmental factors. ?There may be a gene mutation in one of these genes that current testing methods cannot detect but that chance is small. ?There could be another gene that has not yet been discovered, or that we have not yet tested, that is responsible for the cancer diagnoses in the family.  ?It is also possible there is a hereditary cause for the cancer in the family that Sarah Flores did not inherit. ? ? ?Therefore, it is important to remain in touch with cancer genetics in the future so that we can continue to offer Sarah Flores the most up to date genetic testing.  ? ? ?ADDITIONAL GENETIC TESTING:  ?We discussed with Sarah Flores that her genetic testing was fairly extensive.  If there are genes identified to increase cancer risk that can be analyzed in the future, we would be happy to discuss and coordinate this testing at that time.   ? ?CANCER SCREENING RECOMMENDATIONS:  ?Sarah Flores's test result is considered negative (normal).  This means that we have not identified a hereditary cause for her personal history of breast cancer at this time.  ? ?An individual's cancer risk and medical management are not determined by genetic test results alone. Overall cancer risk assessment incorporates additional factors, including personal medical history, family history, and any available genetic information that may result in a personalized plan for cancer prevention and surveillance. Therefore, it is recommended she  continue to follow the cancer management and screening guidelines provided by her oncology and primary healthcare provider. ? ?RECOMMENDATIONS FOR FAMILY MEMBERS:   ?Since she did not inherit a identifiable mutation in a cancer predisposition gene included on this panel, her children could not have inherited a known mutation from her in one of these genes. ?Individuals in this family might be at some increased risk of developing cancer, over the general population risk, due to the family history of cancer.  Individuals in the family should notify their providers of the family history of cancer. We recommend women in this family have a yearly mammogram beginning at age 49, or 65 years younger than the earliest onset of cancer, an annual clinical breast exam, and perform monthly breast self-exams.   ?Other members of the family may still carry a pathogenic variant in one of these genes that Sarah Flores did not inherit. Based on the family history of ovarian cancer in her deceased aunts, we recommend their first degree relatives have genetic counseling and testing. Sarah Flores will let us know if we can be of any assistance in coordinating genetic counseling and/or  testing for these family members.    ? ? ?FOLLOW-UP:  ?Lastly, we discussed with Sarah Flores that cancer genetics is a rapidly advancing field and it is possible that new genetic tests will be appropriate for her and/or her family members in the future. We encouraged her to remain in contact with cancer genetics on an annual basis so we can update her personal and family histories and let her know of advances in cancer genetics that may benefit this family.  ? ?Our contact number was provided. Sarah Flores questions were answered to her satisfaction, and she knows she is welcome to call us at anytime with additional questions or concerns.  ? ?Grenda Lora M. Joette Catching, [place], [place] ?Genetic Counselor ?[email or web address] ?(P) [phone] ? ?

## 2021-07-02 ENCOUNTER — Other Ambulatory Visit: Payer: Self-pay

## 2021-07-02 ENCOUNTER — Inpatient Hospital Stay: Payer: Medicare Other | Attending: Hematology and Oncology | Admitting: Hematology and Oncology

## 2021-07-02 ENCOUNTER — Other Ambulatory Visit: Payer: Self-pay | Admitting: *Deleted

## 2021-07-02 DIAGNOSIS — D0512 Intraductal carcinoma in situ of left breast: Secondary | ICD-10-CM | POA: Insufficient documentation

## 2021-07-02 MED ORDER — CEPHALEXIN 500 MG PO CAPS: 500.0000 mg | ORAL_CAPSULE | Freq: Two times a day (BID) | ORAL | 0 refills | Status: DC

## 2021-07-02 MED ORDER — CLINDAMYCIN HCL 300 MG PO CAPS: 300.0000 mg | ORAL_CAPSULE | Freq: Two times a day (BID) | ORAL | 0 refills | Status: DC

## 2021-07-02 NOTE — Assessment & Plan Note (Addendum)
05/21/2021 :Screening mammogram detected left breast mass 0.7 cm by ultrasound, axilla negative, biopsy revealed low-grade to intermediate grade DCIS ER 100%, PR 100%  ? ?06/21/2021:Left lumpectomy: Intermediate grade DCIS 0.9 cm, margins negative, ER 100%, PR 100% ? ?Pathology counseling: I discussed the final pathology report of the patient provided  a copy of this report. I discussed the margins.  We also discussed the final staging along with previously performed ER/PR testing. ? ?Treatment plan: ?1.  Adjuvant radiation therapy ?2. antiestrogen therapy with tamoxifen daily x5 years ?Patient is interested in taking every advantage of preventative treatments that can lower her risk of breast cancer. ? ?Left arm bruise: Patient says it happened during surgery.  She does been there was pus that came out this morning.  Overall it looks like there is slight redness and tenderness to touch.  We rebandaged it and gave her a prescription for Keflex for a week. ? ?Return to clinic after radiation is complete to start antiestrogens ? ?

## 2021-07-02 NOTE — Progress Notes (Signed)
Received call from pt stating she has an allergy Keflex.  Reviewed with MD and verbal orders received for pt to receive Clindamycin 300 mg p.o BID x 7 days.  Prescription sent to pharmacy on file.  Pt educated and verbalized understanding.   ?

## 2021-07-05 ENCOUNTER — Encounter (HOSPITAL_COMMUNITY): Payer: Self-pay

## 2021-07-18 ENCOUNTER — Telehealth: Payer: Self-pay

## 2021-07-18 NOTE — Telephone Encounter (Signed)
Reminded patient of her 9:00am-07/19/21 in-person appointment w/ Shona Simpson PA-C. I advised patient to arrive 73mn early for check-in. I left my extension [phone] case patient needs anything. Patient verbalized understanding of information. ?

## 2021-07-19 ENCOUNTER — Ambulatory Visit
Admission: RE | Admit: 2021-07-19 | Discharge: 2021-07-19 | Disposition: A | Discharge status: Home / Self Care | Payer: Medicare Other | Source: Ambulatory Visit | Attending: Radiation Oncology | Admitting: Radiation Oncology

## 2021-07-19 ENCOUNTER — Encounter: Payer: Self-pay | Admitting: Radiation Oncology

## 2021-07-19 ENCOUNTER — Other Ambulatory Visit: Payer: Self-pay

## 2021-07-19 VITALS — BP 155/71 | HR 69 | Temp 97.5°F | Resp 18 | Ht 63.0 in | Wt 142.4 lb

## 2021-07-19 DIAGNOSIS — Z7984 Long term (current) use of oral hypoglycemic drugs: Secondary | ICD-10-CM | POA: Insufficient documentation

## 2021-07-19 DIAGNOSIS — D631 Anemia in chronic kidney disease: Secondary | ICD-10-CM | POA: Diagnosis not present

## 2021-07-19 DIAGNOSIS — M797 Fibromyalgia: Secondary | ICD-10-CM | POA: Insufficient documentation

## 2021-07-19 DIAGNOSIS — Z7982 Long term (current) use of aspirin: Secondary | ICD-10-CM | POA: Insufficient documentation

## 2021-07-19 DIAGNOSIS — Z7985 Long-term (current) use of injectable non-insulin antidiabetic drugs: Secondary | ICD-10-CM | POA: Diagnosis not present

## 2021-07-19 DIAGNOSIS — I129 Hypertensive chronic kidney disease with stage 1 through stage 4 chronic kidney disease, or unspecified chronic kidney disease: Secondary | ICD-10-CM | POA: Diagnosis not present

## 2021-07-19 DIAGNOSIS — Z8041 Family history of malignant neoplasm of ovary: Secondary | ICD-10-CM | POA: Diagnosis not present

## 2021-07-19 DIAGNOSIS — N182 Chronic kidney disease, stage 2 (mild): Secondary | ICD-10-CM | POA: Diagnosis not present

## 2021-07-19 DIAGNOSIS — Z8042 Family history of malignant neoplasm of prostate: Secondary | ICD-10-CM | POA: Diagnosis not present

## 2021-07-19 DIAGNOSIS — Z170 Estrogen receptor positive status [ER+]: Secondary | ICD-10-CM | POA: Diagnosis not present

## 2021-07-19 DIAGNOSIS — D0512 Intraductal carcinoma in situ of left breast: Secondary | ICD-10-CM | POA: Insufficient documentation

## 2021-07-19 DIAGNOSIS — Z809 Family history of malignant neoplasm, unspecified: Secondary | ICD-10-CM | POA: Diagnosis not present

## 2021-07-19 DIAGNOSIS — Z79899 Other long term (current) drug therapy: Secondary | ICD-10-CM | POA: Diagnosis not present

## 2021-07-19 DIAGNOSIS — Z800 Family history of malignant neoplasm of digestive organs: Secondary | ICD-10-CM | POA: Insufficient documentation

## 2021-07-19 DIAGNOSIS — K7581 Nonalcoholic steatohepatitis (NASH): Secondary | ICD-10-CM | POA: Diagnosis not present

## 2021-07-19 DIAGNOSIS — Z510 Encounter for antineoplastic radiation therapy: Secondary | ICD-10-CM | POA: Insufficient documentation

## 2021-07-19 DIAGNOSIS — Z8673 Personal history of transient ischemic attack (TIA), and cerebral infarction without residual deficits: Secondary | ICD-10-CM | POA: Insufficient documentation

## 2021-07-19 DIAGNOSIS — Z803 Family history of malignant neoplasm of breast: Secondary | ICD-10-CM | POA: Diagnosis not present

## 2021-07-19 DIAGNOSIS — I252 Old myocardial infarction: Secondary | ICD-10-CM | POA: Diagnosis not present

## 2021-07-19 NOTE — Progress Notes (Signed)
Consult appointment. I verified patient's identity and began nursing interview w/ spouse Mr. Conswella Bruney in attendance. Patient reports LT breast tenderness 3/10. No other issues reported at this time.  Meaningful use complete.  BP (!) 155/71 (BP Location: Right Arm, Patient Position: Sitting, Cuff Size: Normal)   Pulse 69   Temp (!) 97.5 F (36.4 C) (Temporal)   Resp 18   Ht '5\' 3"'$  (1.6 m)   Wt 142 lb 6.4 oz (64.6 kg)   SpO2 100%   BMI 25.23 kg/m

## 2021-07-19 NOTE — Progress Notes (Signed)
Radiation Oncology         [phone] ________________________________  Name: Sarah Flores        MRN: [id number]  Date of Service: 07/19/2021 DOB: [birth year]  [id number], [name], NP  Nicholas Lose, MD     REFERRING PHYSICIAN: Nicholas Lose, MD   DIAGNOSIS: The encounter diagnosis was Ductal carcinoma in situ (DCIS) of left breast.   HISTORY OF PRESENT ILLNESS: Sarah Flores is a 76 y.o. female originally seen in the multidisciplinary breast clinic for a new diagnosis of left breast cancer. The patient was noted to have screening detected group of calcifications in the lower inner quadrant of the left breast.  Further diagnostic imaging including an ultrasound showed a 7 mm irregular mass with spiculated margin at the 7 o'clock position, her axilla was negative for adenopathy.  A biopsy revealed low to intermediate grade DCIS that was ER/PR positive.    Since her last visit she underwent a left lumpectomy on 06/21/21 that showed a intermediate grade DCIS measuring 9 mm.  Her margins were negative but the medial and inferior margins were less than 1 mm.  Previous biopsy site changes were also identified.  Her postoperative course for her breast has been uneventful but she did develop an infection and an excoriated area of her left forearm.  She was treated with Keflex by Dr. Lindi Adie.  She is seen to proceed with adjuvant radiotherapy to the left breast.  PREVIOUS RADIATION THERAPY: No   PAST MEDICAL HISTORY:  Past Medical History:  Diagnosis Date   Anemia    Arthritis    Breast cancer ([place])    Cardiogenic shock ([place])    CKD (chronic kidney disease), stage II    Diabetes mellitus ([place])    DVT (deep venous thrombosis) ([place]) 03/05/1991   "right arm; from IV I had when I had my hysterectomy; on Coumadin for awhile" (02/18/2013)   Dyslipidemia, goal LDL below 70    Family history of breast cancer 06/06/2021   Family history of ovarian cancer 06/06/2021   Family history  of prostate cancer 06/06/2021   Fibromyalgia    History of kidney stones    Hypertension    NASH (nonalcoholic steatohepatitis)    Dr. Percell Miller in [place]   Palpitations    a. event monitor 07/2013 did not show significant arrhythmia - mostly NSR 70-100, with occasional S Tachy ~100-115, rare PACs & PVCs - not recorded as symptomatic.   ST elevation myocardial infarction (STEMI) of lateral wall ([place])    a. Post-op STEMI 02/2013 with cardiogenic shock, cath revealing clean cors and LV dysfunction c/w Takasubo Syndrome.   Stroke [facility])    a. [facility] 03/2014: No acute intracranial abnormality, probable small old cortical infarct in L posterior parietal region.   Syncope    a. Reported prior near-syncope in 2015 with suggestion of atrial fibrillation but no documentation to support this. An event monitor 07/2013 did not show significant arrhythmia - mostly NSR 70-100, with occasional S Tachy ~100-115, rare PACs & PVCs - not recorded as symptomatic. b. Syncope 03/2014 felt vasovagal.   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 EF 40%.   CHOLECYSTECTOMY  ~2001   COLONOSCOPY W/ BIOPSIES     LARYNGOSCOPY     laryngoscopy micro suspension with left Cymetra injection for dysphonia 09/29/09 [facility])   LEFT HEART CATHETERIZATION WITH CORONARY ANGIOGRAM N/A 02/22/2013   Procedure: LEFT HEART CATHETERIZATION WITH CORONARY ANGIOGRAM;  Surgeon: Sanda Klein, MD;   Location: [facility] CATH LAB;  Service: Cardiovascular; angiographically normal coronary arteries. LV dysfunction consistent with Takotsubo Cardiomyopathy/Syndrome    MAXIMUM ACCESS (MAS)POSTERIOR LUMBAR INTERBODY FUSION (PLIF) 1 LEVEL N/A 02/18/2013   Procedure: LUMBAR FIVE TO SACRAL ONE MAXIMUM ACCESS (MAS) POSTERIOR LUMBAR INTERBODY FUSION (PLIF) 1 LEVEL;  Surgeon: Eustace Moore, MD;  Location: [place] NEURO ORS;  Service: Neurosurgery;  Laterality: N/A;   PARATHYROIDECTOMY  08/07/2009   [facility]   PLANTAR'S WART EXCISION Bilateral 1960's; 1980   TONSILLECTOMY  1950's   TRANSTHORACIC ECHOCARDIOGRAM  02/18/2013   EF 35-30% with anterolateral, lateral and inferolateral/apical hypokinesis (Takotsubo)    TRANSTHORACIC ECHOCARDIOGRAM  02/25/2013   EF 55%. No clear regional wall motion abnormalities. Grade 3 diastolic dysfunction. Mild MR   TRANSTHORACIC ECHOCARDIOGRAM  December 2015; January 2016   a. Normal LV size and function. EF 55-60%. Gr1 DD;; b. Normal LV size function with EF 60-65%. No RWM/A no comment on valve lesions or abnormalities. No comment on diastolic function.   TUBAL LIGATION  1979     FAMILY HISTORY:  Family History  Problem Relation Age of Onset   Cancer Mother 75   Heart attack Mother 69   Lung cancer Mother    Heart attack Father 77   Leukemia Father 37   Prostate cancer Father 43       metastatic   Heart disease Brother    Ovarian cancer Maternal Aunt        dx after 37   Cancer Maternal Aunt        unknown type; ? lung; dx after 74   Colon cancer Maternal Uncle        dx after 22   Ovarian cancer Paternal Aunt        dx after 62   Brain cancer Maternal Grandmother        dx mid 20s   Heart attack Maternal Grandfather    Diabetes Paternal Grandmother    Heart attack Paternal Grandfather    Breast cancer Cousin        paternal female first cousins; dx 82s; x3 cousins     SOCIAL HISTORY:  reports that she has never smoked. She has never used smokeless  tobacco. She reports that she does not drink alcohol and does not use drugs. The patient is married and lives in [place], [place].   ALLERGIES: Keflex [cephalexin], Penicillins, and Benzoin   MEDICATIONS:  Current Outpatient Medications  Medication Sig Dispense Refill   acetaminophen (TYLENOL) 500 MG tablet Take 1,000 mg by mouth every 6 (six) hours as needed for headache.     amLODipine (NORVASC) 5 MG tablet Take 1 tablet by mouth in the morning and at bedtime.     aspirin EC 81 MG tablet Take 81 mg by mouth daily.     atorvastatin (LIPITOR) 10 MG tablet Take 10 mg by mouth daily.     clindamycin (CLEOCIN) 300 MG capsule  Take 1 capsule (300 mg total) by mouth 2 (two) times daily. 14 capsule 0   Dulaglutide (TRULICITY) 5.46 TK/3.5WS SOPN Inject 0.75 mg into the skin every Monday.     famotidine (PEPCID) 20 MG tablet Take 1 tablet (20 mg total) by mouth daily in the afternoon. (Patient taking differently: Take 20 mg by mouth 2 (two) times daily.) 90 tablet 3   glipiZIDE (GLUCOTROL XL) 10 MG 24 hr tablet Take 10 mg by mouth daily.     ipratropium (ATROVENT) 0.06 % nasal spray Place 1-2 sprays into both nostrils every 6 (six) hours as needed for rhinitis.     losartan (COZAAR) 100 MG tablet Take 100 mg by mouth daily.     metFORMIN (GLUCOPHAGE-XR) 500 MG 24 hr tablet Take 1,000 mg by mouth 2 (two) times a day.     metoprolol succinate (TOPROL-XL) 100 MG 24 hr tablet TAKE 1 TABLET DAILY (TAKE WITH OR IMMEDIATELY FOLLOWING A MEAL) (Patient taking differently: Take 50-100 mg by mouth See admin instructions. '50mg'$  in the morning and '100mg'$  in the evening) 90 tablet 2   montelukast (SINGULAIR) 10 MG tablet TAKE 1 TABLET BY MOUTH EVERYDAY AT BEDTIME (Patient taking differently: Take 10 mg by mouth at bedtime.) 30 tablet 10   nitroGLYCERIN (NITROSTAT) 0.4 MG SL tablet Place 1 tablet (0.4 mg total) under the tongue every 5 (five) minutes as needed for chest pain. 25 tablet 6   oxyCODONE (ROXICODONE) 5 MG  immediate release tablet Take 1 tablet (5 mg total) by mouth every 6 (six) hours as needed for severe pain. 10 tablet 0   No current facility-administered medications for this encounter.     REVIEW OF SYSTEMS: On review of systems, the patient reports that she is doing well overall and her arm infection has healed up. She's had some soreness in her breast but not frank pain, and has not needed pain medication since surgery. No other complaints are verbalized.      PHYSICAL EXAM:  Wt Readings from Last 3 Encounters:  07/02/21 144 lb 4.8 oz (65.5 kg)  06/21/21 141 lb (64 kg)  06/14/21 141 lb 4.8 oz (64.1 kg)   Temp Readings from Last 3 Encounters:  07/02/21 97.8 F (36.6 C) (Temporal)  06/21/21 97.7 F (36.5 C)  06/14/21 98.3 F (36.8 C) (Oral)   BP Readings from Last 3 Encounters:  07/02/21 (!) 158/66  06/21/21 (!) 158/71  06/14/21 (!) 180/90   Pulse Readings from Last 3 Encounters:  07/02/21 82  06/21/21 71  06/14/21 60    In general this is a well appearing caucasian female in no acute distress. She's alert and oriented x4 and appropriate throughout the examination. Cardiopulmonary assessment is negative for acute distress and she exhibits normal effort. The left breast reveals a well healed surgical incision without erythema, separation, or drainage.     ECOG = 0  0 - Asymptomatic (Fully active, able to carry on all predisease activities without restriction)  1 - Symptomatic but completely ambulatory (Restricted in physically strenuous activity but ambulatory and able to carry out work of a light or sedentary nature. For example, light housework, office work)  2 - Symptomatic, <50% in bed during the day (Ambulatory and capable of all self care but unable to carry out any work activities. Up and about more than 50% of waking hours)  3 - Symptomatic, >50% in bed, but not bedbound (Capable of only limited self-care, confined to bed or chair 50% or more of  waking  hours)  4 - Bedbound (Completely disabled. Cannot carry on any self-care. Totally confined to bed or chair)  5 - Death   Eustace Pen MM, Creech RH, Tormey DC, et al. [phone]). "Toxicity and response criteria of the [facility]". [place] Oncol. 5 (6): 649-55    LABORATORY DATA:  Lab Results  Component Value Date   WBC 11.1 (H) 06/06/2021   HGB 12.9 06/06/2021   HCT 37.5 06/06/2021   MCV 95.9 06/06/2021   PLT 301 06/06/2021   Lab Results  Component Value Date   NA 138 06/06/2021   K 3.8 06/06/2021   CL 99 06/06/2021   CO2 28 06/06/2021   Lab Results  Component Value Date   ALT 22 06/06/2021   AST 20 06/06/2021   ALKPHOS 49 06/06/2021   BILITOT 1.6 (H) 06/06/2021      RADIOGRAPHY: No results found.     IMPRESSION/PLAN: 1.  Intermediate grade, ER/PR positive DCIS  of the left breast. Dr. Lisbeth Renshaw has reviewed her final pathology results. Today I also  reviewed the nature of left breast disease. With her close margins and desires to be aggressive with her therapy, she would benefit from external radiotherapy to the breast  to reduce risks of local recurrence followed by antiestrogen therapy. We discussed the risks, benefits, short, and long term effects of radiotherapy, as well as the curative intent, and the patient is interested in proceeding. I reviewed the delivery and logistics of radiotherapy and Dr. Lisbeth Renshaw recommends 4 weeks of radiotherapy to the left breast with deep inspiration breath hold technique. Written consent is obtained and placed in the chart, a copy was provided to the patient. She will simulate today.  In a visit lasting 45 minutes, greater than 50% of the time was spent face to face reviewing her case, as well as in preparation of, discussing, and coordinating the patient's care.       Carola Rhine, [facility]    **Disclaimer: This note was dictated with voice recognition software. Similar sounding words can inadvertently be transcribed  and this note may contain transcription errors which may not have been corrected upon publication of note.**

## 2021-07-26 ENCOUNTER — Encounter: Payer: Self-pay | Admitting: *Deleted

## 2021-07-27 ENCOUNTER — Telehealth: Payer: Self-pay | Admitting: Hematology and Oncology

## 2021-07-27 NOTE — Telephone Encounter (Signed)
.  Called patient to schedule appointment per 5/23 inbasket, patient is aware of date and time.   

## 2021-07-30 DIAGNOSIS — Z510 Encounter for antineoplastic radiation therapy: Secondary | ICD-10-CM | POA: Diagnosis not present

## 2021-07-31 ENCOUNTER — Ambulatory Visit
Admission: RE | Admit: 2021-07-31 | Discharge: 2021-07-31 | Disposition: A | Discharge status: Home / Self Care | Payer: Medicare Other | Source: Ambulatory Visit | Attending: Radiation Oncology | Admitting: Radiation Oncology

## 2021-07-31 ENCOUNTER — Other Ambulatory Visit: Payer: Self-pay

## 2021-07-31 DIAGNOSIS — Z510 Encounter for antineoplastic radiation therapy: Secondary | ICD-10-CM | POA: Diagnosis not present

## 2021-07-31 LAB — RAD ONC ARIA SESSION SUMMARY
Course Elapsed Days: 0
Plan Fractions Treated to Date: 1
Plan Prescribed Dose Per Fraction: 2.66 Gy
Plan Total Fractions Prescribed: 16
Plan Total Prescribed Dose: 42.56 Gy
Reference Point Dosage Given to Date: 2.66 Gy
Reference Point Session Dosage Given: 2.66 Gy
Session Number: 1

## 2021-08-01 ENCOUNTER — Ambulatory Visit
Admission: RE | Admit: 2021-08-01 | Discharge: 2021-08-01 | Disposition: A | Discharge status: Home / Self Care | Payer: Medicare Other | Source: Ambulatory Visit | Attending: Radiation Oncology | Admitting: Radiation Oncology

## 2021-08-01 ENCOUNTER — Other Ambulatory Visit: Payer: Self-pay

## 2021-08-01 DIAGNOSIS — Z510 Encounter for antineoplastic radiation therapy: Secondary | ICD-10-CM | POA: Diagnosis not present

## 2021-08-01 LAB — RAD ONC ARIA SESSION SUMMARY
Course Elapsed Days: 1
Plan Fractions Treated to Date: 2
Plan Prescribed Dose Per Fraction: 2.66 Gy
Plan Total Fractions Prescribed: 16
Plan Total Prescribed Dose: 42.56 Gy
Reference Point Dosage Given to Date: 5.32 Gy
Reference Point Session Dosage Given: 2.66 Gy
Session Number: 2

## 2021-08-02 ENCOUNTER — Ambulatory Visit
Admission: RE | Admit: 2021-08-02 | Discharge: 2021-08-02 | Disposition: A | Discharge status: Home / Self Care | Payer: Medicare Other | Source: Ambulatory Visit | Attending: Radiation Oncology | Admitting: Radiation Oncology

## 2021-08-02 ENCOUNTER — Other Ambulatory Visit: Payer: Self-pay

## 2021-08-02 DIAGNOSIS — D0512 Intraductal carcinoma in situ of left breast: Secondary | ICD-10-CM | POA: Diagnosis present

## 2021-08-02 LAB — RAD ONC ARIA SESSION SUMMARY
Course Elapsed Days: 2
Plan Fractions Treated to Date: 3
Plan Prescribed Dose Per Fraction: 2.66 Gy
Plan Total Fractions Prescribed: 16
Plan Total Prescribed Dose: 42.56 Gy
Reference Point Dosage Given to Date: 7.98 Gy
Reference Point Session Dosage Given: 2.66 Gy
Session Number: 3

## 2021-08-02 NOTE — Progress Notes (Signed)
Pt here for patient teaching.  Pt given Radiation and You booklet, skin care instructions, alra deodorant and Radiaplex.    Reviewed areas of pertinence such as fatigue, hair loss, skin changes, breast tenderness, and breast swelling. Pt able to give teach back of to pat skin and use unscented/gentle soap,apply Radiaplex bid, avoid applying anything to skin within 4 hours of treatment, avoid wearing an under wire bra, and to use an electric razor if they must shave. Pt verbalizes understanding of information given and will contact nursing with any questions or concerns.    Raley Novicki M. Toni Demo RN, BSN  

## 2021-08-03 ENCOUNTER — Ambulatory Visit
Admission: RE | Admit: 2021-08-03 | Discharge: 2021-08-03 | Disposition: A | Discharge status: Home / Self Care | Payer: Medicare Other | Source: Ambulatory Visit | Attending: Radiation Oncology | Admitting: Radiation Oncology

## 2021-08-03 ENCOUNTER — Other Ambulatory Visit: Payer: Self-pay

## 2021-08-03 DIAGNOSIS — D0512 Intraductal carcinoma in situ of left breast: Secondary | ICD-10-CM | POA: Diagnosis not present

## 2021-08-03 LAB — RAD ONC ARIA SESSION SUMMARY
Course Elapsed Days: 3
Plan Fractions Treated to Date: 4
Plan Prescribed Dose Per Fraction: 2.66 Gy
Plan Total Fractions Prescribed: 16
Plan Total Prescribed Dose: 42.56 Gy
Reference Point Dosage Given to Date: 10.64 Gy
Reference Point Session Dosage Given: 2.66 Gy
Session Number: 4

## 2021-08-03 MED ORDER — ALRA NON-METALLIC DEODORANT (RAD-ONC)
1.0000 "application " | Freq: Once | TOPICAL | Status: AC
  Administered 2021-08-03: 1 via TOPICAL

## 2021-08-03 MED ORDER — RADIAPLEXRX EX GEL: Freq: Once | CUTANEOUS | Status: AC

## 2021-08-06 ENCOUNTER — Ambulatory Visit
Admission: RE | Admit: 2021-08-06 | Discharge: 2021-08-06 | Disposition: A | Discharge status: Home / Self Care | Payer: Medicare Other | Source: Ambulatory Visit | Attending: Radiation Oncology | Admitting: Radiation Oncology

## 2021-08-06 ENCOUNTER — Other Ambulatory Visit: Payer: Self-pay

## 2021-08-06 DIAGNOSIS — D0512 Intraductal carcinoma in situ of left breast: Secondary | ICD-10-CM | POA: Diagnosis not present

## 2021-08-06 LAB — RAD ONC ARIA SESSION SUMMARY
Course Elapsed Days: 6
Plan Fractions Treated to Date: 5
Plan Prescribed Dose Per Fraction: 2.66 Gy
Plan Total Fractions Prescribed: 16
Plan Total Prescribed Dose: 42.56 Gy
Reference Point Dosage Given to Date: 13.3 Gy
Reference Point Session Dosage Given: 2.66 Gy
Session Number: 5

## 2021-08-07 ENCOUNTER — Ambulatory Visit
Admission: RE | Admit: 2021-08-07 | Discharge: 2021-08-07 | Disposition: A | Discharge status: Home / Self Care | Payer: Medicare Other | Source: Ambulatory Visit | Attending: Radiation Oncology | Admitting: Radiation Oncology

## 2021-08-07 ENCOUNTER — Other Ambulatory Visit: Payer: Self-pay

## 2021-08-07 DIAGNOSIS — D0512 Intraductal carcinoma in situ of left breast: Secondary | ICD-10-CM | POA: Diagnosis not present

## 2021-08-07 LAB — RAD ONC ARIA SESSION SUMMARY
Course Elapsed Days: 7
Plan Fractions Treated to Date: 6
Plan Prescribed Dose Per Fraction: 2.66 Gy
Plan Total Fractions Prescribed: 16
Plan Total Prescribed Dose: 42.56 Gy
Reference Point Dosage Given to Date: 15.96 Gy
Reference Point Session Dosage Given: 2.66 Gy
Session Number: 6

## 2021-08-08 ENCOUNTER — Other Ambulatory Visit: Payer: Self-pay

## 2021-08-08 ENCOUNTER — Ambulatory Visit
Admission: RE | Admit: 2021-08-08 | Discharge: 2021-08-08 | Disposition: A | Discharge status: Home / Self Care | Payer: Medicare Other | Source: Ambulatory Visit | Attending: Radiation Oncology | Admitting: Radiation Oncology

## 2021-08-08 DIAGNOSIS — D0512 Intraductal carcinoma in situ of left breast: Secondary | ICD-10-CM | POA: Diagnosis not present

## 2021-08-08 LAB — RAD ONC ARIA SESSION SUMMARY
Course Elapsed Days: 8
Plan Fractions Treated to Date: 7
Plan Prescribed Dose Per Fraction: 2.66 Gy
Plan Total Fractions Prescribed: 16
Plan Total Prescribed Dose: 42.56 Gy
Reference Point Dosage Given to Date: 18.62 Gy
Reference Point Session Dosage Given: 2.66 Gy
Session Number: 7

## 2021-08-09 ENCOUNTER — Other Ambulatory Visit: Payer: Self-pay

## 2021-08-09 ENCOUNTER — Ambulatory Visit
Admission: RE | Admit: 2021-08-09 | Discharge: 2021-08-09 | Disposition: A | Discharge status: Home / Self Care | Payer: Medicare Other | Source: Ambulatory Visit | Attending: Radiation Oncology | Admitting: Radiation Oncology

## 2021-08-09 DIAGNOSIS — D0512 Intraductal carcinoma in situ of left breast: Secondary | ICD-10-CM | POA: Diagnosis not present

## 2021-08-09 LAB — RAD ONC ARIA SESSION SUMMARY
Course Elapsed Days: 9
Plan Fractions Treated to Date: 8
Plan Prescribed Dose Per Fraction: 2.66 Gy
Plan Total Fractions Prescribed: 16
Plan Total Prescribed Dose: 42.56 Gy
Reference Point Dosage Given to Date: 21.28 Gy
Reference Point Session Dosage Given: 2.66 Gy
Session Number: 8

## 2021-08-10 ENCOUNTER — Other Ambulatory Visit: Payer: Self-pay

## 2021-08-10 ENCOUNTER — Ambulatory Visit
Admission: RE | Admit: 2021-08-10 | Discharge: 2021-08-10 | Disposition: A | Discharge status: Home / Self Care | Payer: Medicare Other | Source: Ambulatory Visit | Attending: Radiation Oncology | Admitting: Radiation Oncology

## 2021-08-10 DIAGNOSIS — D0512 Intraductal carcinoma in situ of left breast: Secondary | ICD-10-CM | POA: Diagnosis not present

## 2021-08-10 LAB — RAD ONC ARIA SESSION SUMMARY
Course Elapsed Days: 10
Plan Fractions Treated to Date: 9
Plan Prescribed Dose Per Fraction: 2.66 Gy
Plan Total Fractions Prescribed: 16
Plan Total Prescribed Dose: 42.56 Gy
Reference Point Dosage Given to Date: 23.94 Gy
Reference Point Session Dosage Given: 2.66 Gy
Session Number: 9

## 2021-08-13 ENCOUNTER — Ambulatory Visit
Admission: RE | Admit: 2021-08-13 | Discharge: 2021-08-13 | Disposition: A | Discharge status: Home / Self Care | Payer: Medicare Other | Source: Ambulatory Visit | Attending: Radiation Oncology | Admitting: Radiation Oncology

## 2021-08-13 ENCOUNTER — Other Ambulatory Visit: Payer: Self-pay

## 2021-08-13 DIAGNOSIS — D0512 Intraductal carcinoma in situ of left breast: Secondary | ICD-10-CM | POA: Diagnosis not present

## 2021-08-13 LAB — RAD ONC ARIA SESSION SUMMARY
Course Elapsed Days: 13
Plan Fractions Treated to Date: 10
Plan Prescribed Dose Per Fraction: 2.66 Gy
Plan Total Fractions Prescribed: 16
Plan Total Prescribed Dose: 42.56 Gy
Reference Point Dosage Given to Date: 26.6 Gy
Reference Point Session Dosage Given: 2.66 Gy
Session Number: 10

## 2021-08-14 ENCOUNTER — Other Ambulatory Visit: Payer: Self-pay

## 2021-08-14 ENCOUNTER — Ambulatory Visit
Admission: RE | Admit: 2021-08-14 | Discharge: 2021-08-14 | Disposition: A | Discharge status: Home / Self Care | Payer: Medicare Other | Source: Ambulatory Visit | Attending: Radiation Oncology | Admitting: Radiation Oncology

## 2021-08-14 DIAGNOSIS — D0512 Intraductal carcinoma in situ of left breast: Secondary | ICD-10-CM | POA: Diagnosis not present

## 2021-08-14 LAB — RAD ONC ARIA SESSION SUMMARY
Course Elapsed Days: 14
Plan Fractions Treated to Date: 11
Plan Prescribed Dose Per Fraction: 2.66 Gy
Plan Total Fractions Prescribed: 16
Plan Total Prescribed Dose: 42.56 Gy
Reference Point Dosage Given to Date: 29.26 Gy
Reference Point Session Dosage Given: 2.66 Gy
Session Number: 11

## 2021-08-15 ENCOUNTER — Other Ambulatory Visit: Payer: Self-pay

## 2021-08-15 ENCOUNTER — Ambulatory Visit
Admission: RE | Admit: 2021-08-15 | Discharge: 2021-08-15 | Disposition: A | Discharge status: Home / Self Care | Payer: Medicare Other | Source: Ambulatory Visit | Attending: Radiation Oncology | Admitting: Radiation Oncology

## 2021-08-15 DIAGNOSIS — D0512 Intraductal carcinoma in situ of left breast: Secondary | ICD-10-CM | POA: Diagnosis not present

## 2021-08-15 LAB — RAD ONC ARIA SESSION SUMMARY
Course Elapsed Days: 15
Plan Fractions Treated to Date: 12
Plan Prescribed Dose Per Fraction: 2.66 Gy
Plan Total Fractions Prescribed: 16
Plan Total Prescribed Dose: 42.56 Gy
Reference Point Dosage Given to Date: 31.92 Gy
Reference Point Session Dosage Given: 2.66 Gy
Session Number: 12

## 2021-08-16 ENCOUNTER — Other Ambulatory Visit: Payer: Self-pay

## 2021-08-16 ENCOUNTER — Ambulatory Visit
Admission: RE | Admit: 2021-08-16 | Discharge: 2021-08-16 | Disposition: A | Discharge status: Home / Self Care | Payer: Medicare Other | Source: Ambulatory Visit | Attending: Radiation Oncology | Admitting: Radiation Oncology

## 2021-08-16 DIAGNOSIS — D0512 Intraductal carcinoma in situ of left breast: Secondary | ICD-10-CM | POA: Diagnosis not present

## 2021-08-16 LAB — RAD ONC ARIA SESSION SUMMARY
Course Elapsed Days: 16
Plan Fractions Treated to Date: 13
Plan Prescribed Dose Per Fraction: 2.66 Gy
Plan Total Fractions Prescribed: 16
Plan Total Prescribed Dose: 42.56 Gy
Reference Point Dosage Given to Date: 34.58 Gy
Reference Point Session Dosage Given: 2.66 Gy
Session Number: 13

## 2021-08-16 NOTE — Progress Notes (Signed)
Patient Care Team: Mayer Camel, NP as PCP - General (Internal Medicine) [name], MD as PCP - Cardiology (Cardiology) Jovita Kussmaul, MD as Consulting Physician (General Surgery) Nicholas Lose, MD as Consulting Physician (Hematology and Oncology) Kyung Rudd, MD as Consulting Physician (Radiation Oncology) Mauro Kaufmann, RN as Oncology Nurse Navigator Rockwell Germany, RN as Oncology Nurse Navigator  DIAGNOSIS:  Encounter Diagnosis  Name Primary?   Ductal carcinoma in situ (DCIS) of left breast     SUMMARY OF ONCOLOGIC HISTORY: Oncology History  Ductal carcinoma in situ (DCIS) of left breast  06/04/2021 Initial Diagnosis   Screening mammogram detected left breast mass 0.7 cm by ultrasound, axilla negative, biopsy revealed low-grade to intermediate grade DCIS ER 100%, PR 100%   06/06/2021 Cancer Staging   Staging form: Breast, AJCC 8th Edition - Clinical: cT1b, cN0, cM0, G2, ER+, PR+, HER2: Not Assessed - Signed by Nicholas Lose, MD on 06/06/2021 Stage prefix: Initial diagnosis Histologic grading system: 3 grade system   06/16/2021 Genetic Testing   Negative hereditary cancer genetic testing: no pathogenic variants detected in [facility] BRCAPlus Panel or CancerNext-Expanded +RNAinsight Panel.  Report dates are 06/16/2021 and 06/18/2021.   The BRCAplus panel offered by [employer] and includes sequencing and deletion/duplication analysis for the following 8 genes: ATM, BRCA1, BRCA2, CDH1, CHEK2, PALB2, PTEN, and TP53.  The CancerNext-Expanded gene panel offered by [facility] and includes sequencing, rearrangement, and RNA analysis for the following 77 genes: AIP, ALK, APC, ATM, AXIN2, BAP1, BARD1, BLM, BMPR1A, BRCA1, BRCA2, BRIP1, CDC73, CDH1, CDK4, CDKN1B, CDKN2A, CHEK2, CTNNA1, DICER1, FANCC, FH, FLCN, GALNT12, KIF1B, LZTR1, MAX, MEN1, MET, MLH1, MSH2, MSH3, MSH6, MUTYH, NBN, NF1, NF2, NTHL1, PALB2, PHOX2B, PMS2, POT1, PRKAR1A, PTCH1, PTEN, RAD51C, RAD51D, RB1,  RECQL, RET, SDHA, SDHAF2, SDHB, SDHC, SDHD, SMAD4, SMARCA4, SMARCB1, SMARCE1, STK11, SUFU, TMEM127, TP53, TSC1, TSC2, VHL and XRCC2 (sequencing and deletion/duplication); EGFR, EGLN1, HOXB13, KIT, MITF, PDGFRA, POLD1, and POLE (sequencing only); EPCAM and GREM1 (deletion/duplication only).    06/21/2021 Surgery   Left lumpectomy: Intermediate grade DCIS 0.9 cm, margins negative, ER 100%, PR 100%     CHIEF COMPLIANT: Follow-up after radiation  INTERVAL HISTORY: Sarah Flores is a 76 y.o. female is here because of a diagnosis of left breast mass. She presents to the clinic today. 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 States that she can barley raise her arm. Complains about the blisters that she had noticed have finally improved. She is here to discuss the pros and cons of antiestrogen therapy.  ALLERGIES:  is allergic to keflex [cephalexin], penicillins, and benzoin.  MEDICATIONS:  Current Outpatient Medications  Medication Sig Dispense Refill   tamoxifen (NOLVADEX) 10 MG tablet Take 1 tablet (10 mg total) by mouth daily. 90 tablet 3   acetaminophen (TYLENOL) 500 MG tablet Take 1,000 mg by mouth every 6 (six) hours as needed for headache.     amLODipine (NORVASC) 5 MG tablet Take 1 tablet by mouth in the morning and at bedtime. (Patient not taking: Reported on 07/19/2021)     aspirin EC 81 MG tablet Take 81 mg by mouth daily.     atorvastatin (LIPITOR) 10 MG tablet Take 10 mg by mouth daily.     clindamycin (CLEOCIN) 300 MG capsule Take 1 capsule (300 mg total) by mouth 2 (two) times daily. (Patient not taking: Reported on 07/19/2021) 14 capsule 0   Dulaglutide (TRULICITY) 1.28 NO/6.7EH SOPN  Inject 0.75 mg into the skin every Monday.     famotidine (PEPCID) 20 MG tablet Take 1 tablet (20 mg total) by mouth daily in the afternoon. (Patient taking differently: Take 20 mg by mouth 2 (two) times daily.) 90 tablet 3   furosemide (LASIX) 20  MG tablet Take 20 mg by mouth daily.     glipiZIDE (GLUCOTROL XL) 10 MG 24 hr tablet Take 10 mg by mouth daily.     ipratropium (ATROVENT) 0.06 % nasal spray Place 1-2 sprays into both nostrils every 6 (six) hours as needed for rhinitis.     losartan (COZAAR) 100 MG tablet Take 100 mg by mouth daily.     metFORMIN (GLUCOPHAGE-XR) 500 MG 24 hr tablet Take 1,000 mg by mouth 2 (two) times a day.     metoprolol succinate (TOPROL-XL) 100 MG 24 hr tablet TAKE 1 TABLET DAILY (TAKE WITH OR IMMEDIATELY FOLLOWING A MEAL) (Patient taking differently: Take 50-100 mg by mouth See admin instructions. 90m in the morning and 106min the evening) 90 tablet 2   montelukast (SINGULAIR) 10 MG tablet TAKE 1 TABLET BY MOUTH EVERYDAY AT BEDTIME (Patient taking differently: Take 10 mg by mouth at bedtime.) 30 tablet 10   nitroGLYCERIN (NITROSTAT) 0.4 MG SL tablet Place 1 tablet (0.4 mg total) under the tongue every 5 (five) minutes as needed for chest pain. 25 tablet 6   oxyCODONE (ROXICODONE) 5 MG immediate release tablet Take 1 tablet (5 mg total) by mouth every 6 (six) hours as needed for severe pain. (Patient not taking: Reported on 07/19/2021) 10 tablet 0   No current facility-administered medications for this visit.    PHYSICAL EXAMINATION: ECOG PERFORMANCE STATUS: 1 - Symptomatic but completely ambulatory  Vitals:   08/30/21 0945  BP: (!) 185/78  Pulse: 66  Resp: 18  Temp: (!) 97.3 F (36.3 C)  SpO2: 100%   Filed Weights   08/30/21 0945  Weight: 142 lb 3.2 oz (64.5 kg)      LABORATORY DATA:  I have reviewed the data as listed    Latest Ref Rng & Units 06/06/2021   12:14 PM 03/14/2020   12:44 PM 03/08/2014   12:31 PM  CMP  Glucose 70 - 99 mg/dL 119  200  127   BUN 8 - 23 mg/dL _0 Creatinine 0.44 - 1.00 mg/dL 0.83  0.81  0.87   Sodium 135 - 145 mmol/L 138  136  137   Potassium 3.5 - 5.1 mmol/L 3.8  4.0  4.3   Chloride 98 - 111 mmol/L 99  98  100   CO2 22 - 32 mmol/L _1 Calcium 8.9 - 10.3 mg/dL 7.4  7.6  8.2   Total Protein 6.5 - 8.1 g/dL 7.2     Total Bilirubin 0.3 - 1.2 mg/dL 1.6     Alkaline Phos 38 - 126 U/L 49     AST 15 - 41 U/L 20     ALT 0 - 44 U/L 22       Lab Results  Component Value Date   WBC 11.1 (H) 06/06/2021   HGB 12.9 06/06/2021   HCT 37.5 06/06/2021   MCV 95.9 06/06/2021   PLT 301 06/06/2021   NEUTROABS 5.8 06/06/2021    ASSESSMENT & PLAN:  Ductal carcinoma in situ (DCIS) of left breast 05/21/2021 :Screening mammogram detected left breast mass 0.7 cm by ultrasound, axilla negative, biopsy revealed low-grade  to intermediate grade DCIS ER 100%, PR 100%    06/21/2021:Left lumpectomy: Intermediate grade DCIS 0.9 cm, margins negative, ER 100%, PR 100%    Treatment plan: 1.  Adjuvant radiation therapy 08/01/21-08/27/21 2. antiestrogen therapy with tamoxifen daily x5 years (based upon TAM-01 clinical trial we are using lower dosage of tamoxifen at 10 mg daily.  We can go down to 5 mg if needed.)  Tamoxifen Counseling: We discussed the risks and benefits of tamoxifen. These include but not limited to insomnia, hot flashes, mood changes, vaginal dryness, and weight gain. Although rare, serious side effects including endometrial cancer, risk of blood clots were also discussed. We strongly believe that the benefits far outweigh the risks. Patient understands these risks and consented to starting treatment. Planned treatment duration is 5 years.  RTC in 3 months for SCP visit    No orders of the defined types were placed in this encounter.  The patient has a good understanding of the overall plan. she agrees with it. she will call with any problems that may develop before the next visit here. Total time spent: 30 mins including face to face time and time spent for planning, charting and co-ordination of care   Harriette Ohara, MD 08/30/21    I Gardiner Coins am scribing for Dr. Lindi Adie  I have reviewed the above documentation for  accuracy and completeness, and I agree with the above.

## 2021-08-17 ENCOUNTER — Ambulatory Visit: Payer: Medicare Other | Admitting: Radiation Oncology

## 2021-08-17 ENCOUNTER — Ambulatory Visit
Admission: RE | Admit: 2021-08-17 | Discharge: 2021-08-17 | Disposition: A | Discharge status: Home / Self Care | Payer: Medicare Other | Source: Ambulatory Visit | Attending: Radiation Oncology | Admitting: Radiation Oncology

## 2021-08-17 ENCOUNTER — Other Ambulatory Visit: Payer: Self-pay

## 2021-08-17 DIAGNOSIS — D0512 Intraductal carcinoma in situ of left breast: Secondary | ICD-10-CM | POA: Diagnosis not present

## 2021-08-17 LAB — RAD ONC ARIA SESSION SUMMARY
Course Elapsed Days: 17
Plan Fractions Treated to Date: 14
Plan Prescribed Dose Per Fraction: 2.66 Gy
Plan Total Fractions Prescribed: 16
Plan Total Prescribed Dose: 42.56 Gy
Reference Point Dosage Given to Date: 37.24 Gy
Reference Point Session Dosage Given: 2.66 Gy
Session Number: 14

## 2021-08-20 ENCOUNTER — Other Ambulatory Visit: Payer: Self-pay

## 2021-08-20 ENCOUNTER — Ambulatory Visit
Admission: RE | Admit: 2021-08-20 | Discharge: 2021-08-20 | Disposition: A | Discharge status: Home / Self Care | Payer: Medicare Other | Source: Ambulatory Visit | Attending: Radiation Oncology | Admitting: Radiation Oncology

## 2021-08-20 DIAGNOSIS — D0512 Intraductal carcinoma in situ of left breast: Secondary | ICD-10-CM | POA: Diagnosis not present

## 2021-08-20 LAB — RAD ONC ARIA SESSION SUMMARY
Course Elapsed Days: 20
Plan Fractions Treated to Date: 15
Plan Prescribed Dose Per Fraction: 2.66 Gy
Plan Total Fractions Prescribed: 16
Plan Total Prescribed Dose: 42.56 Gy
Reference Point Dosage Given to Date: 39.9 Gy
Reference Point Session Dosage Given: 2.66 Gy
Session Number: 15

## 2021-08-21 ENCOUNTER — Encounter: Payer: Self-pay | Admitting: *Deleted

## 2021-08-21 ENCOUNTER — Ambulatory Visit
Admission: RE | Admit: 2021-08-21 | Discharge: 2021-08-21 | Disposition: A | Discharge status: Home / Self Care | Payer: Medicare Other | Source: Ambulatory Visit | Attending: Radiation Oncology | Admitting: Radiation Oncology

## 2021-08-21 ENCOUNTER — Other Ambulatory Visit: Payer: Self-pay

## 2021-08-21 DIAGNOSIS — D0512 Intraductal carcinoma in situ of left breast: Secondary | ICD-10-CM

## 2021-08-21 LAB — RAD ONC ARIA SESSION SUMMARY
Course Elapsed Days: 21
Plan Fractions Treated to Date: 16
Plan Prescribed Dose Per Fraction: 2.66 Gy
Plan Total Fractions Prescribed: 16
Plan Total Prescribed Dose: 42.56 Gy
Reference Point Dosage Given to Date: 42.56 Gy
Reference Point Session Dosage Given: 2.66 Gy
Session Number: 16

## 2021-08-22 ENCOUNTER — Other Ambulatory Visit: Payer: Self-pay

## 2021-08-22 ENCOUNTER — Ambulatory Visit
Admission: RE | Admit: 2021-08-22 | Discharge: 2021-08-22 | Disposition: A | Discharge status: Home / Self Care | Payer: Medicare Other | Source: Ambulatory Visit | Attending: Radiation Oncology | Admitting: Radiation Oncology

## 2021-08-22 DIAGNOSIS — D0512 Intraductal carcinoma in situ of left breast: Secondary | ICD-10-CM | POA: Diagnosis not present

## 2021-08-22 LAB — RAD ONC ARIA SESSION SUMMARY
Course Elapsed Days: 22
Plan Fractions Treated to Date: 1
Plan Prescribed Dose Per Fraction: 2.5 Gy
Plan Total Fractions Prescribed: 4
Plan Total Prescribed Dose: 10 Gy
Reference Point Dosage Given to Date: 45.06 Gy
Reference Point Session Dosage Given: 2.5 Gy
Session Number: 17

## 2021-08-23 ENCOUNTER — Ambulatory Visit
Admission: RE | Admit: 2021-08-23 | Discharge: 2021-08-23 | Disposition: A | Discharge status: Home / Self Care | Payer: Medicare Other | Source: Ambulatory Visit | Attending: Radiation Oncology | Admitting: Radiation Oncology

## 2021-08-23 ENCOUNTER — Other Ambulatory Visit: Payer: Self-pay

## 2021-08-23 DIAGNOSIS — D0512 Intraductal carcinoma in situ of left breast: Secondary | ICD-10-CM | POA: Diagnosis not present

## 2021-08-23 LAB — RAD ONC ARIA SESSION SUMMARY
Course Elapsed Days: 23
Plan Fractions Treated to Date: 2
Plan Prescribed Dose Per Fraction: 2.5 Gy
Plan Total Fractions Prescribed: 4
Plan Total Prescribed Dose: 10 Gy
Reference Point Dosage Given to Date: 47.56 Gy
Reference Point Session Dosage Given: 2.5 Gy
Session Number: 18

## 2021-08-24 ENCOUNTER — Ambulatory Visit
Admission: RE | Admit: 2021-08-24 | Discharge: 2021-08-24 | Disposition: A | Discharge status: Home / Self Care | Payer: Medicare Other | Source: Ambulatory Visit | Attending: Radiation Oncology | Admitting: Radiation Oncology

## 2021-08-24 ENCOUNTER — Other Ambulatory Visit: Payer: Self-pay

## 2021-08-24 DIAGNOSIS — D0512 Intraductal carcinoma in situ of left breast: Secondary | ICD-10-CM | POA: Diagnosis not present

## 2021-08-24 LAB — RAD ONC ARIA SESSION SUMMARY
Course Elapsed Days: 24
Plan Fractions Treated to Date: 3
Plan Prescribed Dose Per Fraction: 2.5 Gy
Plan Total Fractions Prescribed: 4
Plan Total Prescribed Dose: 10 Gy
Reference Point Dosage Given to Date: 50.06 Gy
Reference Point Session Dosage Given: 2.5 Gy
Session Number: 19

## 2021-08-27 ENCOUNTER — Other Ambulatory Visit: Payer: Self-pay

## 2021-08-27 ENCOUNTER — Encounter: Payer: Self-pay | Admitting: Radiation Oncology

## 2021-08-27 ENCOUNTER — Ambulatory Visit
Admission: RE | Admit: 2021-08-27 | Discharge: 2021-08-27 | Disposition: A | Discharge status: Home / Self Care | Payer: Medicare Other | Source: Ambulatory Visit | Attending: Radiation Oncology | Admitting: Radiation Oncology

## 2021-08-27 DIAGNOSIS — D0512 Intraductal carcinoma in situ of left breast: Secondary | ICD-10-CM | POA: Diagnosis not present

## 2021-08-27 LAB — RAD ONC ARIA SESSION SUMMARY
Course Elapsed Days: 27
Plan Fractions Treated to Date: 4
Plan Prescribed Dose Per Fraction: 2.5 Gy
Plan Total Fractions Prescribed: 4
Plan Total Prescribed Dose: 10 Gy
Reference Point Dosage Given to Date: 52.56 Gy
Reference Point Session Dosage Given: 2.5 Gy
Session Number: 20

## 2021-08-29 NOTE — Assessment & Plan Note (Signed)
05/21/2021 :Screening mammogram detected left breast mass 0.7 cm by ultrasound, axilla negative, biopsy revealed low-grade to intermediate grade DCIS ER 100%, PR 100%   06/21/2021:Left lumpectomy: Intermediate grade DCIS 0.9 cm, margins negative, ER 100%, PR 100%  Treatment plan: 1.  Adjuvant radiation therapy 08/01/21-08/27/21 2. antiestrogen therapy with tamoxifen daily x5 years  Tamoxifen Counseling: We discussed the risks and benefits of tamoxifen. These include but not limited to insomnia, hot flashes, mood changes, vaginal dryness, and weight gain. Although rare, serious side effects including endometrial cancer, risk of blood clots were also discussed. We strongly believe that the benefits far outweigh the risks. Patient understands these risks and consented to starting treatment. Planned treatment duration is 5 years.  RTC in 3 months for SCP visit

## 2021-08-30 ENCOUNTER — Other Ambulatory Visit: Payer: Self-pay

## 2021-08-30 ENCOUNTER — Inpatient Hospital Stay: Payer: Medicare Other | Attending: Hematology and Oncology | Admitting: Hematology and Oncology

## 2021-08-30 DIAGNOSIS — D0512 Intraductal carcinoma in situ of left breast: Secondary | ICD-10-CM | POA: Insufficient documentation

## 2021-08-30 MED ORDER — TAMOXIFEN CITRATE 10 MG PO TABS: 10.0000 mg | ORAL_TABLET | Freq: Every day | ORAL | 3 refills | Status: DC

## 2021-09-10 ENCOUNTER — Other Ambulatory Visit: Payer: Self-pay | Admitting: Allergy and Immunology

## 2021-10-04 ENCOUNTER — Other Ambulatory Visit: Payer: Self-pay | Admitting: Allergy and Immunology

## 2021-10-08 ENCOUNTER — Ambulatory Visit
Admission: RE | Admit: 2021-10-08 | Discharge: 2021-10-08 | Disposition: A | Discharge status: Home / Self Care | Payer: Medicare Other | Source: Ambulatory Visit | Attending: Radiation Oncology | Admitting: Radiation Oncology

## 2021-10-08 DIAGNOSIS — D0512 Intraductal carcinoma in situ of left breast: Secondary | ICD-10-CM

## 2021-10-11 ENCOUNTER — Ambulatory Visit (INDEPENDENT_AMBULATORY_CARE_PROVIDER_SITE_OTHER): Payer: Medicare Other | Admitting: Allergy and Immunology

## 2021-10-11 ENCOUNTER — Encounter: Payer: Self-pay | Admitting: Allergy and Immunology

## 2021-10-11 VITALS — BP 162/66 | HR 68 | Resp 14 | Ht 61.4 in | Wt 140.8 lb

## 2021-10-11 DIAGNOSIS — T783XXD Angioneurotic edema, subsequent encounter: Secondary | ICD-10-CM | POA: Diagnosis not present

## 2021-10-11 DIAGNOSIS — L500 Allergic urticaria: Secondary | ICD-10-CM | POA: Diagnosis not present

## 2021-10-11 DIAGNOSIS — J3089 Other allergic rhinitis: Secondary | ICD-10-CM | POA: Diagnosis not present

## 2021-10-11 MED ORDER — FAMOTIDINE 20 MG PO TABS
20.0000 mg | ORAL_TABLET | Freq: Every day | ORAL | 3 refills | Status: DC
Start: 2021-10-11 — End: 2022-10-18

## 2021-10-11 MED ORDER — MONTELUKAST SODIUM 10 MG PO TABS: ORAL_TABLET | ORAL | 3 refills | Status: DC

## 2021-10-11 NOTE — Progress Notes (Signed)
[place]   Follow-up Note  Referring Provider: Bess Harvest* Primary Provider: Mayer Camel, NP Date of Office Visit: 10/11/2021  Subjective:   Sarah Flores (DOB: Jul 06, 1945) is a 76 y.o. female who returns to the [facility] [place] on 10/11/2021 in re-evaluation of the following:  HPI: Cheri returns to this clinic in reevaluation of urticaria and angioedema and allergic rhinitis.  I last saw her in this clinic on 11 October 2020.  She continues to do very well regarding her urticaria and angioedema and this has been an active while she continues to utilize a H2 receptor blocker and a leukotriene modifier.  She does not use cetirizine that often.  She has had some issues with sneezing and nasal congestion.  She is not using a nasal steroid.  She does have rhinorrhea and occasionally she will use nasal ipratropium.  Allergies as of 10/11/2021       Reactions   Keflex [cephalexin] Anaphylaxis   Penicillins Anaphylaxis   Benzoin Other (See Comments)   Burning - "looked like a 2nd degree burn"        Medication List    acetaminophen 500 MG tablet Commonly known as: TYLENOL Take 1,000 mg by mouth every 6 (six) hours as needed for headache.   aspirin EC 81 MG tablet Take 81 mg by mouth daily.   atorvastatin 10 MG tablet Commonly known as: LIPITOR Take 1 tablet by mouth at bedtime.   cyanocobalamin 1000 MCG/ML injection Commonly known as: VITAMIN B12 Inject into the muscle.   famotidine 20 MG tablet Commonly known as: PEPCID Take 1 tablet (20 mg total) by mouth daily.   furosemide 20 MG tablet Commonly known as: LASIX Take 20 mg by mouth daily.   glipiZIDE 10 MG 24 hr tablet Commonly known as: GLUCOTROL XL Take 10 mg by mouth daily.   ipratropium 0.06 % nasal spray Commonly known as: ATROVENT Place 1-2 sprays into both nostrils every 6 (six) hours as needed for rhinitis.    losartan-hydrochlorothiazide 100-25 MG tablet Commonly known as: HYZAAR Take 1 tablet by mouth daily.   metFORMIN 500 MG 24 hr tablet Commonly known as: GLUCOPHAGE-XR Take 1,000 mg by mouth 2 (two) times a day.   metoprolol succinate 100 MG 24 hr tablet Commonly known as: TOPROL-XL TAKE 1 TABLET DAILY (TAKE WITH OR IMMEDIATELY FOLLOWING A MEAL) What changed: See the new instructions.   montelukast 10 MG tablet Commonly known as: SINGULAIR Take one tablet by mouth once daily as directed. What changed: See the new instructions. Changed by: Mekhia Brogan Kevan Rosebush, MD   Nasacort Allergy 24HR 55 MCG/ACT Aero nasal inhaler Generic drug: triamcinolone Place 2 sprays into the nose daily as needed.   nitroGLYCERIN 0.4 MG SL tablet Commonly known as: NITROSTAT Place 1 tablet (0.4 mg total) under the tongue every 5 (five) minutes as needed for chest pain.   tamoxifen 10 MG tablet Commonly known as: NOLVADEX Take 1 tablet (10 mg total) by mouth daily.   Trulicity 0.32 ZY/2.4MG Sopn Generic drug: Dulaglutide Inject 0.75 mg into the skin every Monday.    Past Medical History:  Diagnosis Date   Anemia    Arthritis    Breast cancer ([place])    Cardiogenic shock ([place])    CKD (chronic kidney disease), stage II    Diabetes mellitus ([place])    DVT (deep venous thrombosis) ([place]) 03/05/1991   "right arm; from IV I had  when I had my hysterectomy; on Coumadin for awhile" (02/18/2013)   Dyslipidemia, goal LDL below 70    Family history of breast cancer 06/06/2021   Family history of ovarian cancer 06/06/2021   Family history of prostate cancer 06/06/2021   Fibromyalgia    History of kidney stones    Hypertension    NASH (nonalcoholic steatohepatitis)    Dr. Percell Miller in [place]   Palpitations    a. event monitor 07/2013 did not show significant arrhythmia - mostly NSR 70-100, with occasional S Tachy ~100-115, rare PACs & PVCs - not recorded as symptomatic.   ST elevation myocardial infarction (STEMI)  of lateral wall ([place])    a. Post-op STEMI 02/2013 with cardiogenic shock, cath revealing clean cors and LV dysfunction c/w Takasubo Syndrome.   Stroke [facility])    a. CT head at [facility] 03/2014: No acute intracranial abnormality, probable small old cortical infarct in L posterior parietal region.   Syncope    a. Reported prior near-syncope in 2015 with suggestion of atrial fibrillation but no documentation to support this. An event monitor 07/2013 did not show significant arrhythmia - mostly NSR 70-100, with occasional S Tachy ~100-115, rare PACs & PVCs - not recorded as symptomatic. b. Syncope 03/2014 felt vasovagal.   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 EF 40%.   CHOLECYSTECTOMY  ~2001   COLONOSCOPY W/ BIOPSIES     LARYNGOSCOPY     laryngoscopy micro suspension with left Cymetra injection for dysphonia 09/29/09 [facility])   LEFT HEART CATHETERIZATION WITH CORONARY ANGIOGRAM N/A 02/22/2013   Procedure: LEFT HEART CATHETERIZATION WITH CORONARY ANGIOGRAM;  Surgeon: Sanda Klein, MD;  Location: [facility] CATH LAB;  Service: Cardiovascular; angiographically normal coronary arteries. LV dysfunction consistent with Takotsubo Cardiomyopathy/Syndrome    MAXIMUM ACCESS (MAS)POSTERIOR LUMBAR INTERBODY FUSION (PLIF) 1 LEVEL N/A 02/18/2013   Procedure: LUMBAR FIVE TO SACRAL ONE MAXIMUM ACCESS (MAS) POSTERIOR LUMBAR INTERBODY FUSION (PLIF) 1 LEVEL;  Surgeon: Eustace Moore, MD;  Location: [place] NEURO ORS;   Service: Neurosurgery;  Laterality: N/A;   PARATHYROIDECTOMY  08/07/2009   [facility]   PLANTAR'S WART EXCISION Bilateral 1960's; 1980   TONSILLECTOMY  1950's   TRANSTHORACIC ECHOCARDIOGRAM  02/18/2013   EF 35-30% with anterolateral, lateral and inferolateral/apical hypokinesis (Takotsubo)    TRANSTHORACIC ECHOCARDIOGRAM  02/25/2013   EF 55%. No clear regional wall motion abnormalities. Grade 3 diastolic dysfunction. Mild MR   TRANSTHORACIC ECHOCARDIOGRAM  December 2015; January 2016   a. Normal LV size and function. EF 55-60%. Gr1 DD;; b. Normal LV size function with EF 60-65%. No RWM/A no comment on valve lesions or abnormalities. No comment on diastolic function.   TUBAL LIGATION  1979    Review of systems negative except as noted in HPI / PMHx or noted below:  Review of Systems  Constitutional: Negative.   HENT: Negative.    Eyes: Negative.   Respiratory: Negative.    Cardiovascular: Negative.   Gastrointestinal: Negative.   Genitourinary:  Negative.   Musculoskeletal: Negative.   Skin: Negative.   Neurological: Negative.   Endo/Heme/Allergies: Negative.   Psychiatric/Behavioral: Negative.       Objective:   Vitals:   10/11/21 0818  BP: (!) 162/66  Pulse: 68  Resp: 14   Height: 5' 1.4" (156 cm)  Weight: 140 lb 12.8 oz (63.9 kg)   Physical Exam Constitutional:      Appearance: She is not diaphoretic.  HENT:     Head: Normocephalic.     Right Ear: Tympanic membrane, ear canal and external ear normal.     Left Ear: Tympanic membrane, ear canal and external ear normal.     Nose: Nose normal. No mucosal edema or rhinorrhea.     Mouth/Throat:     Pharynx: Uvula midline. No oropharyngeal exudate.  Eyes:     Conjunctiva/sclera: Conjunctivae normal.  Neck:     Thyroid: No thyromegaly.     Trachea: Trachea normal. No tracheal tenderness or tracheal deviation.  Cardiovascular:     Rate and Rhythm: Normal rate and regular rhythm.     Heart sounds: Normal heart  sounds, S1 normal and S2 normal. No murmur heard. Pulmonary:     Effort: No respiratory distress.     Breath sounds: Normal breath sounds. No stridor. No wheezing or rales.  Lymphadenopathy:     Head:     Right side of head: No tonsillar adenopathy.     Left side of head: No tonsillar adenopathy.     Cervical: No cervical adenopathy.  Skin:    Findings: No erythema or rash.     Nails: There is no clubbing.  Neurological:     Mental Status: She is alert.     Diagnostics: none  Assessment and Plan:   1. Angioedema, subsequent encounter   2. Allergic urticaria   3. Other allergic rhinitis    1.  Continue to Treat and prevent inflammation:   A.  Famotidine 20 mg - 1 tablet 1 time per day  B.  Montelukast 10 mg - 1 tablet 1 time per day  C.  OTC Nasacort - 1 spray each nostril 3-7 times per week  2.  If needed:   A. Ipratropium 0.06% - 1-2 sprays each nostril every 6 hours TO DRY NOSE  B. Cetirizine 10 mg - 1-2 tablets 2 times per day (max = 40 mg)  3. Return to clinic in 12 months or earlier if problem  4. Obtain fall flu vaccine and RSV vaccine  Dera appears to be doing quite well regarding her immune activation and she will continue on a selection of agents as noted above to address this issue.  I did encourage her to use a nasal steroid on a pretty consistent basis at least a few times per week which will probably help some of her upper airway inflammation and irritation and she certainly can use other medications should they be required as noted above.  I will see her back in this clinic in 1 year or earlier if there is a problem.  Allena Katz, MD Allergy / Immunology [place]

## 2021-10-11 NOTE — Patient Instructions (Addendum)
  1.  Continue to Treat and prevent inflammation:   A.  Famotidine 20 mg - 1 tablet 1 time per day  B.  Montelukast 10 mg - 1 tablet 1 time per day  C.  OTC Nasacort - 1 spray each nostril 3-7 times per week  2.  If needed:   A. Ipratropium 0.06% - 1-2 sprays each nostril every 6 hours TO DRY NOSE  B. Cetirizine 10 mg - 1-2 tablets 2 times per day (max = 40 mg)  3. Return to clinic in 12 months or earlier if problem  4. Obtain fall flu vaccine and RSV vaccine

## 2021-10-15 ENCOUNTER — Encounter: Payer: Self-pay | Admitting: Allergy and Immunology

## 2021-10-15 NOTE — Progress Notes (Signed)
  Radiation Oncology         [phone] ________________________________  Name: Sarah Flores MRN: [id number]  Date of Service: 10/08/2021  DOB: [birth year]  Post Treatment Telephone Note  Diagnosis:   Intermediate grade, ER/PR positive DCIS  of the left breast.  Intent: Curative  Radiation Treatment Dates: 07/31/2021 through 08/27/2021 Site Technique Total Dose (Gy) Dose per Fx (Gy) Completed Fx Beam Energies  Breast, Left: Breast_L 3D 42.56/42.56 2.66 16/16 6XFFF  Breast, Left: Breast_L_Bst 3D 10/10 2.5 4/4 6X, 10X   Narrative: The patient tolerated radiation therapy relatively well. She developed fatigue and anticipated skin changes in the treatment field.    Impression/Plan: 1. Intermediate grade, ER/PR positive DCIS  of the left breast.. The patient has been doing well since completion of radiotherapy. We discussed that we would be happy to continue to follow her as needed, but she will also continue to follow up with Dr. Lindi Adie in medical oncology. She was counseled on skin care as well as measures to avoid sun exposure to this area.  2. Survivorship. We discussed the importance of survivorship evaluation and encouraged her to attend her upcoming visit with that clinic.      Carola Rhine, PAC

## 2021-11-01 ENCOUNTER — Telehealth: Payer: Self-pay | Admitting: Adult Health

## 2021-11-01 NOTE — Telephone Encounter (Signed)
Rescheduled appointment per provider PAL. Patient is aware of the changes made to her upcoming appointment. 

## 2021-11-29 ENCOUNTER — Encounter: Payer: Self-pay | Admitting: Adult Health

## 2021-11-29 ENCOUNTER — Telehealth: Payer: Self-pay

## 2021-11-29 ENCOUNTER — Other Ambulatory Visit (HOSPITAL_COMMUNITY): Payer: Self-pay

## 2021-11-29 ENCOUNTER — Other Ambulatory Visit: Payer: Self-pay

## 2021-11-29 ENCOUNTER — Inpatient Hospital Stay: Payer: Medicare Other | Attending: Hematology and Oncology | Admitting: Adult Health

## 2021-11-29 VITALS — BP 146/68 | HR 77 | Temp 98.2°F | Resp 18 | Ht 61.4 in | Wt 145.2 lb

## 2021-11-29 DIAGNOSIS — Z79810 Long term (current) use of selective estrogen receptor modulators (SERMs): Secondary | ICD-10-CM | POA: Insufficient documentation

## 2021-11-29 DIAGNOSIS — Z800 Family history of malignant neoplasm of digestive organs: Secondary | ICD-10-CM | POA: Insufficient documentation

## 2021-11-29 DIAGNOSIS — Z90710 Acquired absence of both cervix and uterus: Secondary | ICD-10-CM | POA: Diagnosis not present

## 2021-11-29 DIAGNOSIS — Z801 Family history of malignant neoplasm of trachea, bronchus and lung: Secondary | ICD-10-CM | POA: Diagnosis not present

## 2021-11-29 DIAGNOSIS — Z8042 Family history of malignant neoplasm of prostate: Secondary | ICD-10-CM | POA: Diagnosis not present

## 2021-11-29 DIAGNOSIS — I129 Hypertensive chronic kidney disease with stage 1 through stage 4 chronic kidney disease, or unspecified chronic kidney disease: Secondary | ICD-10-CM | POA: Insufficient documentation

## 2021-11-29 DIAGNOSIS — Z803 Family history of malignant neoplasm of breast: Secondary | ICD-10-CM | POA: Insufficient documentation

## 2021-11-29 DIAGNOSIS — N182 Chronic kidney disease, stage 2 (mild): Secondary | ICD-10-CM | POA: Insufficient documentation

## 2021-11-29 DIAGNOSIS — E1122 Type 2 diabetes mellitus with diabetic chronic kidney disease: Secondary | ICD-10-CM | POA: Diagnosis not present

## 2021-11-29 DIAGNOSIS — Z806 Family history of leukemia: Secondary | ICD-10-CM | POA: Diagnosis not present

## 2021-11-29 DIAGNOSIS — Z8041 Family history of malignant neoplasm of ovary: Secondary | ICD-10-CM | POA: Diagnosis not present

## 2021-11-29 DIAGNOSIS — D0512 Intraductal carcinoma in situ of left breast: Secondary | ICD-10-CM

## 2021-11-29 MED ORDER — TAMOXIFEN CITRATE 10 MG PO TABS
10.0000 mg | ORAL_TABLET | Freq: Every day | ORAL | 3 refills | Status: DC
  Filled 2021-11-29: qty 90, 90d supply, fill #0

## 2021-11-29 NOTE — Progress Notes (Signed)
SURVIVORSHIP VISIT:   BRIEF ONCOLOGIC HISTORY:  Oncology History  Ductal carcinoma in situ (DCIS) of left breast  06/04/2021 Initial Diagnosis   Screening mammogram detected left breast mass 0.7 cm by ultrasound, axilla negative, biopsy revealed low-grade to intermediate grade DCIS ER 100%, PR 100%   06/06/2021 Cancer Staging   Staging form: Breast, AJCC 8th Edition - Clinical: cT1b, cN0, cM0, G2, ER+, PR+, HER2: Not Assessed - Signed by Nicholas Lose, MD on 06/06/2021 Stage prefix: Initial diagnosis Histologic grading system: 3 grade system   06/16/2021 Genetic Testing   Negative hereditary cancer genetic testing: no pathogenic variants detected in [facility] BRCAPlus Panel or CancerNext-Expanded +RNAinsight Panel.  Report dates are 06/16/2021 and 06/18/2021.   The BRCAplus panel offered by [employer] and includes sequencing and deletion/duplication analysis for the following 8 genes: ATM, BRCA1, BRCA2, CDH1, CHEK2, PALB2, PTEN, and TP53.  The CancerNext-Expanded gene panel offered by [facility] and includes sequencing, rearrangement, and RNA analysis for the following 77 genes: AIP, ALK, APC, ATM, AXIN2, BAP1, BARD1, BLM, BMPR1A, BRCA1, BRCA2, BRIP1, CDC73, CDH1, CDK4, CDKN1B, CDKN2A, CHEK2, CTNNA1, DICER1, FANCC, FH, FLCN, GALNT12, KIF1B, LZTR1, MAX, MEN1, MET, MLH1, MSH2, MSH3, MSH6, MUTYH, NBN, NF1, NF2, NTHL1, PALB2, PHOX2B, PMS2, POT1, PRKAR1A, PTCH1, PTEN, RAD51C, RAD51D, RB1, RECQL, RET, SDHA, SDHAF2, SDHB, SDHC, SDHD, SMAD4, SMARCA4, SMARCB1, SMARCE1, STK11, SUFU, TMEM127, TP53, TSC1, TSC2, VHL and XRCC2 (sequencing and deletion/duplication); EGFR, EGLN1, HOXB13, KIT, MITF, PDGFRA, POLD1, and POLE (sequencing only); EPCAM and GREM1 (deletion/duplication only).    06/21/2021 Surgery   Left lumpectomy: Intermediate grade DCIS 0.9 cm, margins negative, ER 100%, PR 100%   07/31/2021 - 08/27/2021 Radiation Therapy   Site Technique Total Dose (Gy) Dose per Fx (Gy) Completed Fx Beam Energies   Breast, Left: Breast_L 3D 42.56/42.56 2.66 16/16 6XFFF  Breast, Left: Breast_L_Bst 3D 10/10 2.5 4/4 6X, 10X     09/2021 -  Anti-estrogen oral therapy   Tamoxifen x 5 years     INTERVAL HISTORY:  Sarah Flores to review her survivorship care plan detailing her treatment course for breast cancer, as well as monitoring long-term side effects of that treatment, education regarding health maintenance, screening, and overall wellness and health promotion.     Overall, Sarah Flores reports feeling quite well.  He is taking tamoxifen daily with good tolerance.  She does have some mild swelling of her left breast she notes that she slipped and fell.  She is doing well otherwise.    REVIEW OF SYSTEMS:  Review of Systems  Constitutional:  Negative for appetite change, chills, fatigue, fever and unexpected weight change.  HENT:   Negative for hearing loss, lump/mass and trouble swallowing.   Eyes:  Negative for eye problems and icterus.  Respiratory:  Negative for chest tightness, cough and shortness of breath.   Cardiovascular:  Negative for chest pain, leg swelling and palpitations.  Gastrointestinal:  Negative for abdominal distention, abdominal pain, constipation, diarrhea, nausea and vomiting.  Endocrine: Negative for hot flashes.  Genitourinary:  Negative for difficulty urinating.   Musculoskeletal:  Negative for arthralgias.  Skin:  Negative for itching and rash.  Neurological:  Negative for dizziness, extremity weakness, headaches and numbness.  Hematological:  Negative for adenopathy. Does not bruise/bleed easily.  Psychiatric/Behavioral:  Negative for depression. The patient is not nervous/anxious.   Breast: Denies any new nodularity, masses, tenderness, nipple changes, or nipple discharge.    ONCOLOGY TREATMENT TEAM:  1. Surgeon:  Dr. Marlou Starks at [facility] 2. Medical Oncologist:  Dr. Lindi Adie  3. Radiation Oncologist: Dr. Lisbeth Renshaw    PAST MEDICAL/SURGICAL HISTORY:  Past Medical  History:  Diagnosis Date   Anemia    Arthritis    Breast cancer ([place])    Cardiogenic shock ([place])    CKD (chronic kidney disease), stage II    Diabetes mellitus ([place])    DVT (deep venous thrombosis) ([place]) 03/05/1991   "right arm; from IV I had when I had my hysterectomy; on Coumadin for awhile" (02/18/2013)   Dyslipidemia, goal LDL below 70    Family history of breast cancer 06/06/2021   Family history of ovarian cancer 06/06/2021   Family history of prostate cancer 06/06/2021   Fibromyalgia    History of kidney stones    Hypertension    NASH (nonalcoholic steatohepatitis)    Dr. Percell Miller in [place]   Palpitations    a. event monitor 07/2013 did not show significant arrhythmia - mostly NSR 70-100, with occasional S Tachy ~100-115, rare PACs & PVCs - not recorded as symptomatic.   ST elevation myocardial infarction (STEMI) of lateral wall ([place])    a. Post-op STEMI 02/2013 with cardiogenic shock, cath revealing clean cors and LV dysfunction c/w Takasubo Syndrome.   Stroke [facility])    a. CT head at [facility] 03/2014: No acute intracranial abnormality, probable small old cortical infarct in L posterior parietal region.   Syncope    a. Reported prior near-syncope in 2015 with suggestion of atrial fibrillation but no documentation to support this. An event monitor 07/2013 did not show significant arrhythmia - mostly NSR 70-100, with occasional S Tachy ~100-115, rare PACs & PVCs - not recorded as symptomatic. b. Syncope 03/2014 felt vasovagal.   Takotsubo cardiomyopathy    Vocal cord paralysis 2011   After parathyroidectomy, s/p VF injection   Past Surgical History:  Procedure Laterality Date   ABDOMINAL HYSTERECTOMY  [id number]   APPENDECTOMY  02/1966   BACK SURGERY     BREAST LUMPECTOMY WITH RADIOACTIVE SEED LOCALIZATION Left 06/21/2021   Procedure: LEFT BREAST LUMPECTOMY WITH RADIOACTIVE SEED LOCALIZATION;  Surgeon: Jovita Kussmaul, MD;  Location: [facility];  Service: General;  Laterality: Left;    [place]  02/22/2013   Nonischemic; no notable CAD. EF 40%.   CHOLECYSTECTOMY  ~2001   COLONOSCOPY W/ BIOPSIES     LARYNGOSCOPY     laryngoscopy micro suspension with left Cymetra injection for dysphonia 09/29/09 [facility])   LEFT HEART CATHETERIZATION WITH CORONARY ANGIOGRAM N/A 02/22/2013   Procedure: LEFT HEART CATHETERIZATION WITH CORONARY ANGIOGRAM;  Surgeon: Sanda Klein, MD;  Location: [facility] CATH LAB;  Service: Cardiovascular; angiographically normal coronary arteries. LV dysfunction consistent with Takotsubo Cardiomyopathy/Syndrome    MAXIMUM ACCESS (MAS)POSTERIOR LUMBAR INTERBODY FUSION (PLIF) 1 LEVEL N/A 02/18/2013   Procedure: LUMBAR FIVE TO SACRAL ONE MAXIMUM ACCESS (MAS) POSTERIOR LUMBAR INTERBODY FUSION (PLIF) 1 LEVEL;  Surgeon: Eustace Moore, MD;  Location: [facility] NEURO ORS;  Service: Neurosurgery;  Laterality: N/A;   PARATHYROIDECTOMY  08/07/2009   [facility]   PLANTAR'S WART EXCISION Bilateral 1960's; 1980   TONSILLECTOMY  1950's   TRANSTHORACIC ECHOCARDIOGRAM  02/18/2013   EF 35-30% with anterolateral, lateral and inferolateral/apical hypokinesis (Takotsubo)    TRANSTHORACIC ECHOCARDIOGRAM  02/25/2013   EF 55%. No clear regional wall motion abnormalities. Grade 3 diastolic dysfunction. Mild MR   TRANSTHORACIC ECHOCARDIOGRAM  December 2015; January 2016   a. Normal LV size and function. EF 55-60%. Gr1 DD;; b. Normal LV size function with  EF 60-65%. No RWM/A no comment on valve lesions or abnormalities. No comment on diastolic function.   TUBAL LIGATION  1979     ALLERGIES:  Allergies  Allergen Reactions   Keflex [Cephalexin] Anaphylaxis   Penicillins Anaphylaxis   Benzoin Other (See Comments)    Burning - "looked like a 2nd degree burn"     CURRENT MEDICATIONS:  Outpatient Encounter Medications as of 11/29/2021  Medication Sig   acetaminophen (TYLENOL) 500 MG tablet Take 1,000 mg by mouth every 6 (six) hours as needed  for headache.   aspirin EC 81 MG tablet Take 81 mg by mouth daily.   atorvastatin (LIPITOR) 10 MG tablet Take 1 tablet by mouth at bedtime.   cyanocobalamin (VITAMIN B12) 1000 MCG/ML injection Inject into the muscle.   doxazosin (CARDURA) 1 MG tablet Take 1 mg by mouth at bedtime.   Dulaglutide (TRULICITY) 0.21 JD/5.5MC SOPN Inject 0.75 mg into the skin every Monday.   famotidine (PEPCID) 20 MG tablet Take 1 tablet (20 mg total) by mouth daily.   glipiZIDE (GLUCOTROL XL) 10 MG 24 hr tablet Take 10 mg by mouth daily.   ipratropium (ATROVENT) 0.06 % nasal spray Place 1-2 sprays into both nostrils every 6 (six) hours as needed for rhinitis.   losartan-hydrochlorothiazide (HYZAAR) 100-25 MG tablet Take 1 tablet by mouth daily.   metFORMIN (GLUCOPHAGE-XR) 500 MG 24 hr tablet Take 1,000 mg by mouth 2 (two) times a day.   metoprolol succinate (TOPROL-XL) 100 MG 24 hr tablet TAKE 1 TABLET DAILY (TAKE WITH OR IMMEDIATELY FOLLOWING A MEAL) (Patient taking differently: Take 50-100 mg by mouth See admin instructions. 84m in the morning and 1047min the evening)   montelukast (SINGULAIR) 10 MG tablet Take one tablet by mouth once daily as directed.   nitroGLYCERIN (NITROSTAT) 0.4 MG SL tablet Place 1 tablet (0.4 mg total) under the tongue every 5 (five) minutes as needed for chest pain.   tamoxifen (NOLVADEX) 10 MG tablet Take 1 tablet (10 mg total) by mouth daily.   triamcinolone (NASACORT ALLERGY 24HR) 55 MCG/ACT AERO nasal inhaler Place 2 sprays into the nose daily as needed.   furosemide (LASIX) 20 MG tablet Take 20 mg by mouth daily. (Patient not taking: Reported on 10/11/2021)   No facility-administered encounter medications on file as of 11/29/2021.     ONCOLOGIC FAMILY HISTORY:  Family History  Problem Relation Age of Onset   Cancer Mother 686 Heart attack Mother 5427 Lung cancer Mother    Heart attack Father 4417 Leukemia Father 8576 Prostate cancer Father 8546     metastatic   Heart  disease Brother    Ovarian cancer Maternal Aunt        dx after 5053 Cancer Maternal Aunt        unknown type; ? lung; dx after 5080 Colon cancer Maternal Uncle        dx after 5074 Ovarian cancer Paternal Aunt        dx after 505 Brain cancer Maternal Grandmother        dx mid 7048s Heart attack Maternal Grandfather    Diabetes Paternal Grandmother    Heart attack Paternal Grandfather    Breast cancer Cousin        paternal female first cousins; dx [id number] cousins     SOCIAL HISTORY:  Social History   Socioeconomic History  Marital status: Married    Spouse name: Not on file   Number of children: Not on file   Years of education: Not on file   Highest education level: Not on file  Occupational History   Occupation: Retired Network engineer  Tobacco Use   Smoking status: Never   Smokeless tobacco: Never  Vaping Use   Vaping Use: Never used  Substance and Sexual Activity   Alcohol use: No   Drug use: No   Sexual activity: Not Currently  Other Topics Concern   Not on file  Social History Narrative   Lives with husband in [place], [place]. She is a married mother of 2 with 2 grandchildren. She has been married since 63. She is retired Network engineer having completed high school. She does not drink alcohol never smoked. She has not been exercising recently, but wants to get back to walking on a treadmill about 3 days a week.    Social Determinants of Health   Financial Resource Strain: Not on file  Food Insecurity: Not on file  Transportation Needs: Not on file  Physical Activity: Not on file  Stress: Not on file  Social Connections: Not on file  Intimate Partner Violence: Not on file     OBSERVATIONS/OBJECTIVE:  BP (!) 146/68 (BP Location: Right Arm, Patient Position: Sitting)   Pulse 77   Temp 98.2 F (36.8 C) (Tympanic)   Resp 18   Ht 5' 1.4" (1.56 m)   Wt 145 lb 3.2 oz (65.9 kg)   SpO2 100%   BMI 27.08 kg/m  GENERAL: Patient is a well appearing female in no  acute distress HEENT:  Sclerae anicteric.  Oropharynx clear and moist. No ulcerations or evidence of oropharyngeal candidiasis. Neck is supple.  NODES:  No cervical, supraclavicular, or axillary lymphadenopathy palpated.  BREAST EXAM:  Deferred. LUNGS:  Clear to auscultation bilaterally.  No wheezes or rhonchi. HEART:  Regular rate and rhythm. No murmur appreciated. ABDOMEN:  Soft, nontender.  Positive, normoactive bowel sounds. No organomegaly palpated. MSK:  No focal spinal tenderness to palpation. Full range of motion bilaterally in the upper extremities. EXTREMITIES:  No peripheral edema.   SKIN:  Clear with no obvious rashes or skin changes. No nail dyscrasia. NEURO:  Nonfocal. Well oriented.  Appropriate affect.   LABORATORY DATA:  None for this visit.  DIAGNOSTIC IMAGING:  None for this visit.      ASSESSMENT AND PLAN:  Ms.. Flores is a pleasant 76 y.o. female with Stage 0 left DCIS ER+/PR+/HER2-, diagnosed in April 2023, treated with lumpectomy, adjuvant radiation therapy, and anti-estrogen therapy with tamoxifen beginning in 09/2021.  She presents to the Survivorship Clinic for our initial meeting and routine follow-up post-completion of treatment for breast cancer.    1. Stage 0 left DCIS: Sarah Flores is continuing to recover from definitive treatment for breast cancer. She will follow-up with her medical oncologist, Dr. Lindi Adie in 6 months with history and physical exam per surveillance protocol.  She will continue her anti-estrogen therapy with Noxafil and. Thus far, she is tolerating the Maxifed well, with minimal side effects. She was instructed to make Dr. Lindi Adie or myself aware if she begins to experience any worsening side effects of the medication and I could see her back in clinic to help manage those side effects, as needed. Her mammogram is due 2024; orders placed today. Today, a comprehensive survivorship care plan and treatment summary was reviewed with the patient today  detailing her breast cancer diagnosis, treatment  course, potential late/long-term effects of treatment, appropriate follow-up care with recommendations for the future, and patient education resources.  A copy of this summary, along with a letter will be sent to the patient's primary care provider via mail/fax/In Basket message after today's visit.    2. Bone health:    She was given education on specific activities to promote bone health.  3. Cancer screening:  Due to Sarah Flores's history and her age, she should receive screening for skin cancers, colon cancer, and gynecologic cancers.  The information and recommendations are listed on the patient's comprehensive care plan/treatment summary and were reviewed in detail with the patient.    4. Health maintenance and wellness promotion: Sarah Flores was encouraged to consume 5-7 servings of fruits and vegetables per day. We reviewed the "Nutrition Rainbow" handout.  She was also encouraged to engage in moderate to vigorous exercise for 30 minutes per day most days of the week. We discussed the [employer] fitness program, which is designed for cancer survivors to help them become more physically fit after cancer treatments.  She was instructed to limit her alcohol consumption and continue to abstain from tobacco use.     5. Support services/counseling: It is not uncommon for this period of the patient's cancer care trajectory to be one of many emotions and stressors.  She was given information regarding our available services and encouraged to contact me with any questions or for help enrolling in any of our support group/programs.    Follow up instructions:    -Return to cancer center in 6 months for follow-up with Dr. Lindi Adie -Mammogram due in March 2024 -Follow up with surgery -She is welcome to return back to the Survivorship Clinic at any time; no additional follow-up needed at this time.  -Consider referral back to survivorship as a long-term  survivor for continued surveillance  The patient was provided an opportunity to ask questions and all were answered. The patient agreed with the plan and demonstrated an understanding of the instructions.   Total encounter time:40 minutes*in face-to-face visit time, chart review, lab review, care coordination, order entry, and documentation of the encounter time.    Sarah Bihari, NP 11/29/21 9:05 AM Medical Oncology and Hematology [facility] [place], [place] [address] Tel. [phone]    Fax. [phone]  *Total Encounter Time as defined by the Centers for Medicare and Medicaid Services includes, in addition to the face-to-face time of a patient visit (documented in the note above) non-face-to-face time: obtaining and reviewing outside history, ordering and reviewing medications, tests or procedures, care coordination (communications with other health care professionals or caregivers) and documentation in the medical record.

## 2021-11-29 NOTE — Telephone Encounter (Signed)
Placed call to Dr Willette Pa office to obtain colonoscopy report as well as DEXA scan and immunizations. Provided our fax number. As of 1612, no fax received from their office yet.

## 2021-11-30 ENCOUNTER — Encounter: Payer: Medicare Other | Admitting: Adult Health

## 2022-03-22 ENCOUNTER — Telehealth: Payer: Self-pay | Admitting: Allergy and Immunology

## 2022-03-22 NOTE — Telephone Encounter (Signed)
Sarah Flores called in and states that she has been taking magnesium pills and noticed she has been breaking out in hives.  She is wondering if she could be allergic to the pills.  She states she is going to stop the pills to see if the hives go away.  She would like a call back from the nurse since she is aware Dr. Neldon Mc is out of the office until Monday.

## 2022-03-22 NOTE — Telephone Encounter (Signed)
Sarah Flores will be coming in on Monday to see Dr. Neldon Mc.

## 2022-03-22 NOTE — Telephone Encounter (Signed)
Called and spoke to patient and she is scheduled to see Dr. Nelva Bush in office on Tuesday morning to be evaluated.

## 2022-03-22 NOTE — Telephone Encounter (Signed)
Can you please advise on behalf of Dr. Neldon Mc since he is off today?

## 2022-03-25 ENCOUNTER — Ambulatory Visit (INDEPENDENT_AMBULATORY_CARE_PROVIDER_SITE_OTHER): Payer: Medicare Other | Admitting: Allergy and Immunology

## 2022-03-25 ENCOUNTER — Encounter: Payer: Self-pay | Admitting: Allergy and Immunology

## 2022-03-25 VITALS — BP 136/82 | HR 61 | Resp 16

## 2022-03-25 DIAGNOSIS — J310 Chronic rhinitis: Secondary | ICD-10-CM

## 2022-03-25 DIAGNOSIS — K7581 Nonalcoholic steatohepatitis (NASH): Secondary | ICD-10-CM

## 2022-03-25 DIAGNOSIS — T783XXD Angioneurotic edema, subsequent encounter: Secondary | ICD-10-CM

## 2022-03-25 DIAGNOSIS — L501 Idiopathic urticaria: Secondary | ICD-10-CM

## 2022-03-25 DIAGNOSIS — L299 Pruritus, unspecified: Secondary | ICD-10-CM | POA: Diagnosis not present

## 2022-03-25 DIAGNOSIS — K7460 Unspecified cirrhosis of liver: Secondary | ICD-10-CM

## 2022-03-25 DIAGNOSIS — K7469 Other cirrhosis of liver: Secondary | ICD-10-CM

## 2022-03-25 DIAGNOSIS — J3089 Other allergic rhinitis: Secondary | ICD-10-CM | POA: Diagnosis not present

## 2022-03-25 NOTE — Progress Notes (Signed)
[facility]   Follow-up Note  Referring Provider: Bess Harvest* Primary Provider: Mayer Camel, NP Date of Office Visit: 03/25/2022  Subjective:   Sarah Flores (DOB: 1946/02/19) is a 77 y.o. female who returns to the [facility] on 03/25/2022 in re-evaluation of the following:  HPI: Sarah Flores returns to this clinic in evaluation of her urticaria and angioedema and allergic rhinitis.  I last saw her in this clinic 11 October 2021.  She was doing quite well using famotidine, montelukast, cetirizine 1 time per day without any significant hives or itching but unfortunately 5 days ago she developed intense itching of her body and some hives especially on her back.  She has had no other associated systemic or constitutional symptoms.  She has not started any new medication other than a magnesium supplement.  She has not started any other over-the-counter supplements or health foods or energy boosters or herbs and has not started any new medication and does not have a significant change that has occurred in her environment and does not have any symptoms of an ongoing infectious disease.  She has been having very little issue with her airway and does not require any nasal steroids at this point.  Allergies as of 03/25/2022       Reactions   Keflex [cephalexin] Anaphylaxis   Penicillins Anaphylaxis   Benzoin Other (See Comments)   Burning - "looked like a 2nd degree burn"        Medication List    acetaminophen 500 MG tablet Commonly known as: TYLENOL Take 1,000 mg by mouth every 6 (six) hours as needed for headache.   aspirin EC 81 MG tablet Take 81 mg by mouth daily.   atorvastatin 10 MG tablet Commonly known as: LIPITOR Take 1 tablet by mouth at bedtime.   cyanocobalamin 1000 MCG/ML injection Commonly known as: VITAMIN B12 Inject into the muscle.   doxazosin 1 MG tablet Commonly known  as: CARDURA Take 1 mg by mouth at bedtime.   famotidine 20 MG tablet Commonly known as: PEPCID Take 1 tablet (20 mg total) by mouth daily.   furosemide 20 MG tablet Commonly known as: LASIX Take 20 mg by mouth daily.   glipiZIDE 10 MG 24 hr tablet Commonly known as: GLUCOTROL XL Take 10 mg by mouth daily.   ipratropium 0.06 % nasal spray Commonly known as: ATROVENT Place 1-2 sprays into both nostrils every 6 (six) hours as needed for rhinitis.   losartan-hydrochlorothiazide 100-25 MG tablet Commonly known as: HYZAAR Take 1 tablet by mouth daily.   magnesium oxide 400 MG tablet Commonly known as: MAG-OX Take by mouth.   metFORMIN 500 MG 24 hr tablet Commonly known as: GLUCOPHAGE-XR Take 1,000 mg by mouth 2 (two) times a day.   metoprolol succinate 100 MG 24 hr tablet Commonly known as: TOPROL-XL TAKE 1 TABLET DAILY (TAKE WITH OR IMMEDIATELY FOLLOWING A MEAL) What changed: See the new instructions.   montelukast 10 MG tablet Commonly known as: SINGULAIR Take one tablet by mouth once daily as directed.   Nasacort Allergy 24HR 55 MCG/ACT Aero nasal inhaler Generic drug: triamcinolone Place 2 sprays into the nose daily as needed.   nitroGLYCERIN 0.4 MG SL tablet Commonly known as: NITROSTAT Place 1 tablet (0.4 mg total) under the tongue every 5 (five) minutes as needed for chest pain.   tamoxifen 10 MG tablet Commonly known as: NOLVADEX Take 1 tablet (10 mg  total) by mouth daily.   Trulicity 2.42 AS/3.4HD Sopn Generic drug: Dulaglutide Inject 0.75 mg into the skin every Monday.    Past Medical History:  Diagnosis Date   Anemia    Arthritis    Breast cancer ([place])    Cardiogenic shock ([place])    CKD (chronic kidney disease), stage II    Diabetes mellitus ([place])    DVT (deep venous thrombosis) ([place]) 03/05/1991   "right arm; from IV I had when I had my hysterectomy; on Coumadin for awhile" (02/18/2013)   Dyslipidemia, goal LDL below 70    Family history of  breast cancer 06/06/2021   Family history of ovarian cancer 06/06/2021   Family history of prostate cancer 06/06/2021   Fibromyalgia    History of kidney stones    Hypertension    NASH (nonalcoholic steatohepatitis)    Dr. Percell Miller in [place]   Palpitations    a. event monitor 07/2013 did not show significant arrhythmia - mostly NSR 70-100, with occasional S Tachy ~100-115, rare PACs & PVCs - not recorded as symptomatic.   ST elevation myocardial infarction (STEMI) of lateral wall ([place])    a. Post-op STEMI 02/2013 with cardiogenic shock, cath revealing clean cors and LV dysfunction c/w Takasubo Syndrome.   Stroke [facility])    a. CT head at [facility] 03/2014: No acute intracranial abnormality, probable small old cortical infarct in L posterior parietal region.   Syncope    a. Reported prior near-syncope in 2015 with suggestion of atrial fibrillation but no documentation to support this. An event monitor 07/2013 did not show significant arrhythmia - mostly NSR 70-100, with occasional S Tachy ~100-115, rare PACs & PVCs - not recorded as symptomatic. b. Syncope 03/2014 felt vasovagal.   Takotsubo cardiomyopathy    Vocal cord paralysis 2011   After parathyroidectomy, s/p VF injection    Past Surgical History:  Procedure Laterality Date   ABDOMINAL HYSTERECTOMY  [id number]   APPENDECTOMY  02/1966   BACK SURGERY     BREAST LUMPECTOMY WITH RADIOACTIVE SEED LOCALIZATION Left 06/21/2021   Procedure: LEFT BREAST LUMPECTOMY WITH RADIOACTIVE SEED LOCALIZATION;  Surgeon: Jovita Kussmaul, MD;  Location: [place];  Service: General;  Laterality: Left;   [place]  02/22/2013   Nonischemic; no notable CAD. EF 40%.   CHOLECYSTECTOMY  ~2001   COLONOSCOPY W/ BIOPSIES     LARYNGOSCOPY     laryngoscopy micro suspension with left Cymetra injection for dysphonia 09/29/09 [facility])   LEFT HEART CATHETERIZATION WITH CORONARY ANGIOGRAM N/A 02/22/2013   Procedure: LEFT HEART  CATHETERIZATION WITH CORONARY ANGIOGRAM;  Surgeon: Sanda Klein, MD;  Location: [facility] CATH LAB;  Service: Cardiovascular; angiographically normal coronary arteries. LV dysfunction consistent with Takotsubo Cardiomyopathy/Syndrome    MAXIMUM ACCESS (MAS)POSTERIOR LUMBAR INTERBODY FUSION (PLIF) 1 LEVEL N/A 02/18/2013   Procedure: LUMBAR FIVE TO SACRAL ONE MAXIMUM ACCESS (MAS) POSTERIOR LUMBAR INTERBODY FUSION (PLIF) 1 LEVEL;  Surgeon: Eustace Moore, MD;  Location: [place] NEURO ORS;  Service: Neurosurgery;  Laterality: N/A;   PARATHYROIDECTOMY  08/07/2009   [facility]   PLANTAR'S WART EXCISION Bilateral 1960's; 1980   TONSILLECTOMY  1950's   TRANSTHORACIC ECHOCARDIOGRAM  02/18/2013   EF 35-30% with anterolateral, lateral and inferolateral/apical hypokinesis (Takotsubo)    TRANSTHORACIC ECHOCARDIOGRAM  02/25/2013   EF 55%. No clear regional wall motion abnormalities. Grade 3 diastolic dysfunction. Mild MR   TRANSTHORACIC ECHOCARDIOGRAM  December 2015; January 2016   a. Normal LV size and function.  EF 55-60%. Gr1 DD;; b. Normal LV size function with EF 60-65%. No RWM/A no comment on valve lesions or abnormalities. No comment on diastolic function.   TUBAL LIGATION  1979    Review of systems negative except as noted in HPI / PMHx or noted below:  Review of Systems  Constitutional: Negative.   HENT: Negative.    Eyes: Negative.   Respiratory: Negative.    Cardiovascular: Negative.   Gastrointestinal: Negative.   Genitourinary: Negative.   Musculoskeletal: Negative.   Skin: Negative.   Neurological: Negative.   Endo/Heme/Allergies: Negative.   Psychiatric/Behavioral: Negative.       Objective:   Vitals:   03/25/22 0833  BP: 136/82  Pulse: 61  Resp: 16  SpO2: 98%          Physical Exam Constitutional:      Appearance: She is not diaphoretic.  HENT:     Head: Normocephalic.     Right Ear: Tympanic membrane, ear canal and external ear normal.     Left Ear: Tympanic membrane,  ear canal and external ear normal.     Nose: Nose normal. No mucosal edema or rhinorrhea.     Mouth/Throat:     Pharynx: Uvula midline. No oropharyngeal exudate.  Eyes:     Conjunctiva/sclera: Conjunctivae normal.  Neck:     Thyroid: No thyromegaly.     Trachea: Trachea normal. No tracheal tenderness or tracheal deviation.  Cardiovascular:     Rate and Rhythm: Normal rate and regular rhythm.     Heart sounds: Normal heart sounds, S1 normal and S2 normal. No murmur heard. Pulmonary:     Effort: No respiratory distress.     Breath sounds: Normal breath sounds. No stridor. No wheezing or rales.  Lymphadenopathy:     Head:     Right side of head: No tonsillar adenopathy.     Left side of head: No tonsillar adenopathy.     Cervical: No cervical adenopathy.  Skin:    Findings: No erythema or rash.     Nails: There is no clubbing.  Neurological:     Mental Status: She is alert.     Diagnostics:    Review of blood tests obtained 06 March 2022 identifies magnesium 1.5 mg/DL.  Results of blood tests obtained 21 February 2022 identifies creatinine 1.21 Mg/DL, total bilirubin 1.7 mg/DL, AST 20 U/L, ALT 17 U/L.  Results of blood tests obtained 10 January 2022 identifies WBC 6.8, absolute eosinophil 300, absolute basophil 100, absolute lymphocyte 2200, hemoglobin 12.4, platelet 286  Results of blood tests obtained 16 October 2021 identifies TSH 2.10 IU/mL  Results of blood tests obtained 22 April 2016 identifies total bilirubin 1.3 mg/DL  Results of blood tests obtained 27 October 2015 identifies total bilirubin 1.2 mg/DL  Results of blood tests obtained 24 June 2014 identifies total bilirubin 1.0 mg/DL  Results of CT scan abdomen obtained 09 October 2017 identifies the following:  - Hepatobiliary: Status post cholecystectomy.  No focal liver lesions.   Results of CT scan abdomen obtained 25 May 2015 identified the following:  - Liver: Decreased attenuation  - Gallbladder:  Absent  - Pancreas: Normal.  - Adrenal glands: Normal.  - Spleen: Normal.  - Kidneys: Normal.   Results of a CT scan abdomen obtained 13 September 2008 identifies the following:  - Mild diffuse fatty infiltration of the liver is noted.   Assessment and Plan:   1. Idiopathic urticaria   2. Angioedema, subsequent encounter   3.  Other allergic rhinitis   4. Gustatory rhinitis   5. Liver cirrhosis secondary to nonalcoholic steatohepatitis (NASH) ([place])   6. Pruritic disorder    1.  Continue to Treat and prevent inflammation:   A.  Famotidine 20 mg - 1 tablet 1 time per day  B.  Montelukast 10 mg - 1 tablet 1 time per day  C.  Cetirizine 10 mg - 1-2 tablets 1-2 times per day (max = 40 mg)  2.  If needed:   A. Ipratropium 0.06% - 1-2 sprays each nostril every 6 hours TO DRY NOSE  B. OTC Nasacort - 1 spray each nostril 3-7 times per week  3. Further evaluation and treatment for itchiness and hives???  4. Evaluation of elevated bilirubin:   A. Complete abdominal ultrasound  B. Hep A/B/C screen, GGT, anti-mitochondrial ab, LKM ab, smooth muscle / actin ab, ANA w/r, ammonia   4. Return to clinic in 12 months or earlier if problem  I am going to have Sarah Flores increase cetirizine from 10 mg a day up to 40 mg a day while she continues on an H2 receptor blocker and a leukotriene modifier and hopefully this will result in better control of her pruritus and urticaria.  She is having a escalating bilirubin over the course of the past several years and has a history of fatty liver and to be complete we will further investigate this elevated bilirubin with the blood test and ultrasound noted above.  I will contact her with the results of those diagnostic studies once they are available for review.  Allena Katz, MD Allergy / Immunology [place]

## 2022-03-25 NOTE — Patient Instructions (Addendum)
  1.  Continue to Treat and prevent inflammation:   A.  Famotidine 20 mg - 1 tablet 1 time per day  B.  Montelukast 10 mg - 1 tablet 1 time per day  C.  Cetirizine 10 mg - 1-2 tablets 1-2 times per day (max = 40 mg)  2.  If needed:   A. Ipratropium 0.06% - 1-2 sprays each nostril every 6 hours TO DRY NOSE  B. OTC Nasacort - 1 spray each nostril 3-7 times per week  3. Further evaluation and treatment for itchiness and hives???  4. Evaluation of elevated bilirubin:   A. Complete abdominal ultrasound  B. Hep A/B/C screen, GGT, anti-mitochondrial ab, LKM ab, smooth muscle / actin ab, ANA w/r, ammonia   4. Return to clinic in 12 months or earlier if problem

## 2022-03-26 ENCOUNTER — Ambulatory Visit: Payer: Medicare Other | Admitting: Allergy

## 2022-03-26 ENCOUNTER — Encounter: Payer: Self-pay | Admitting: Allergy and Immunology

## 2022-03-27 ENCOUNTER — Telehealth: Payer: Self-pay

## 2022-03-27 NOTE — Telephone Encounter (Signed)
Patient complete abdominal ultrasound has been scheduled at [place]  Appointment date: 04/04/2022  Appointment time: 10:30 am  Directions: NPO 6 hours before appointment  No PA required  Patient informed but she would like for appointment to be rescheduled.

## 2022-03-28 LAB — ANA W/REFLEX: Anti Nuclear Antibody (ANA): POSITIVE — AB

## 2022-03-28 LAB — GAMMA GT: GGT: 20 IU/L (ref 0–60)

## 2022-03-28 LAB — ANTI-SMOOTH MUSCLE ANTIBODY, IGG: Smooth Muscle Ab: 18 Units (ref 0–19)

## 2022-03-28 LAB — HBV SCREENING AND DIAGNOSIS
Hep B Core Total Ab: NEGATIVE
Hep B Surface Ab, Qual: NONREACTIVE
Hepatitis B Surface Ag: NEGATIVE

## 2022-03-28 LAB — ENA+DNA/DS+SJORGEN'S
ENA RNP Ab: 3.1 AI — ABNORMAL HIGH (ref 0.0–0.9)
ENA SM Ab Ser-aCnc: <0.2 AI (ref 0.0–0.9)
ENA SSA (RO) Ab: <0.2 AI (ref 0.0–0.9)
ENA SSB (LA) Ab: <0.2 AI (ref 0.0–0.9)
dsDNA Ab: <1 IU/mL (ref 0–9)

## 2022-03-28 LAB — ANTI-MICROSOMAL ANTIBODY LIVER / KIDNEY: LKM1 Ab: 0.9 Units (ref 0.0–20.0)

## 2022-03-28 LAB — AMMONIA: Ammonia: 33 ug/dL (ref 31–169)

## 2022-03-28 LAB — MITOCHONDRIAL ANTIBODIES: Mitochondrial Ab: <20 Units (ref 0.0–20.0)

## 2022-03-28 LAB — HEPATITIS A ANTIBODY, IGM: Hep A IgM: NEGATIVE

## 2022-03-28 LAB — HEPATITIS C ANTIBODY: Hep C Virus Ab: NONREACTIVE

## 2022-04-02 NOTE — Telephone Encounter (Signed)
Patient informed but now she wants to cancel. Patient states she had an ultrasound years ago and she does not feel like she needs to do another one. Appointment has be cancelled.

## 2022-04-02 NOTE — Telephone Encounter (Signed)
Appointment has been rescheduled for 04/08/2022 at 1:30

## 2022-05-07 ENCOUNTER — Telehealth: Payer: Self-pay | Admitting: Hematology and Oncology

## 2022-05-07 NOTE — Telephone Encounter (Signed)
Patient call to r/s appointment again. Patient r/s and notified.

## 2022-06-03 ENCOUNTER — Ambulatory Visit: Payer: Medicare Other | Admitting: Hematology and Oncology

## 2022-06-06 ENCOUNTER — Ambulatory Visit: Payer: Medicare Other | Admitting: Hematology and Oncology

## 2022-06-10 NOTE — Progress Notes (Signed)
Patient Care Team: Krystal Clark, NP as PCP - General (Internal Medicine) Marykay Lex, MD as PCP - Cardiology (Cardiology) Griselda Miner, MD as Consulting Physician (General Surgery) Serena Croissant, MD as Consulting Physician (Hematology and Oncology) Dorothy Puffer, MD as Consulting Physician (Radiation Oncology)  DIAGNOSIS: No diagnosis found.  SUMMARY OF ONCOLOGIC HISTORY: Oncology History  Ductal carcinoma in situ (DCIS) of left breast  06/04/2021 Initial Diagnosis   Screening mammogram detected left breast mass 0.7 cm by ultrasound, axilla negative, biopsy revealed low-grade to intermediate grade DCIS ER 100%, PR 100%   06/06/2021 Cancer Staging   Staging form: Breast, AJCC 8th Edition - Clinical: Stage 0 (cTis (DCIS), cN0, cM0, ER+, PR+, HER2: Not Assessed) - Signed by Loa Socks, NP on 11/29/2021 Stage prefix: Initial diagnosis Nuclear grade: G2   06/16/2021 Genetic Testing   Negative hereditary cancer genetic testing: no pathogenic variants detected in [facility] BRCAPlus Panel or CancerNext-Expanded +RNAinsight Panel.  Report dates are 06/16/2021 and 06/18/2021.   The BRCAplus panel offered by [employer] and includes sequencing and deletion/duplication analysis for the following 8 genes: ATM, BRCA1, BRCA2, CDH1, CHEK2, PALB2, PTEN, and TP53.  The CancerNext-Expanded gene panel offered by [facility] and includes sequencing, rearrangement, and RNA analysis for the following 77 genes: AIP, ALK, APC, ATM, AXIN2, BAP1, BARD1, BLM, BMPR1A, BRCA1, BRCA2, BRIP1, CDC73, CDH1, CDK4, CDKN1B, CDKN2A, CHEK2, CTNNA1, DICER1, FANCC, FH, FLCN, GALNT12, KIF1B, LZTR1, MAX, MEN1, MET, MLH1, MSH2, MSH3, MSH6, MUTYH, NBN, NF1, NF2, NTHL1, PALB2, PHOX2B, PMS2, POT1, PRKAR1A, PTCH1, PTEN, RAD51C, RAD51D, RB1, RECQL, RET, SDHA, SDHAF2, SDHB, SDHC, SDHD, SMAD4, SMARCA4, SMARCB1, SMARCE1, STK11, SUFU, TMEM127, TP53, TSC1, TSC2, VHL and XRCC2 (sequencing and deletion/duplication);  EGFR, EGLN1, HOXB13, KIT, MITF, PDGFRA, POLD1, and POLE (sequencing only); EPCAM and GREM1 (deletion/duplication only).    06/21/2021 Surgery   Left lumpectomy: Intermediate grade DCIS 0.9 cm, margins negative, ER 100%, PR 100%   07/31/2021 - 08/27/2021 Radiation Therapy   Site Technique Total Dose (Gy) Dose per Fx (Gy) Completed Fx Beam Energies  Breast, Left: Breast_L 3D 42.56/42.56 2.66 16/16 6XFFF  Breast, Left: Breast_L_Bst 3D 10/10 2.5 4/4 6X, 10X     09/2021 -  Anti-estrogen oral therapy   Tamoxifen x 5 years     CHIEF COMPLIANT: Follow-up tamoxifen  INTERVAL HISTORY: Sarah Flores is a 77 y.o. female is here because of a diagnosis of left breast mass. She presents to the clinic today.    ALLERGIES:  is allergic to keflex [cephalexin], penicillins, and benzoin.  MEDICATIONS:  Current Outpatient Medications  Medication Sig Dispense Refill   acetaminophen (TYLENOL) 500 MG tablet Take 1,000 mg by mouth every 6 (six) hours as needed for headache.     aspirin EC 81 MG tablet Take 81 mg by mouth daily.     atorvastatin (LIPITOR) 10 MG tablet Take 1 tablet by mouth at bedtime.     cyanocobalamin (VITAMIN B12) 1000 MCG/ML injection Inject into the muscle.     doxazosin (CARDURA) 1 MG tablet Take 1 mg by mouth at bedtime.     Dulaglutide (TRULICITY) 0.75 MG/0.5ML SOPN Inject 0.75 mg into the skin every Monday.     famotidine (PEPCID) 20 MG tablet Take 1 tablet (20 mg total) by mouth daily. 90 tablet 3   furosemide (LASIX) 20 MG tablet Take 20 mg by mouth daily.     glipiZIDE (GLUCOTROL XL) 10 MG 24 hr tablet Take 10 mg by mouth daily.  ipratropium (ATROVENT) 0.06 % nasal spray Place 1-2 sprays into both nostrils every 6 (six) hours as needed for rhinitis.     losartan-hydrochlorothiazide (HYZAAR) 100-25 MG tablet Take 1 tablet by mouth daily.     magnesium oxide (MAG-OX) 400 MG tablet Take by mouth.     metFORMIN (GLUCOPHAGE-XR) 500 MG 24 hr tablet Take 1,000 mg by mouth 2 (two)  times a day.     metoprolol succinate (TOPROL-XL) 100 MG 24 hr tablet TAKE 1 TABLET DAILY (TAKE WITH OR IMMEDIATELY FOLLOWING A MEAL) (Patient taking differently: Take 50-100 mg by mouth See admin instructions. 50mg  in the morning and 100mg  in the evening) 90 tablet 2   montelukast (SINGULAIR) 10 MG tablet Take one tablet by mouth once daily as directed. 90 tablet 3   nitroGLYCERIN (NITROSTAT) 0.4 MG SL tablet Place 1 tablet (0.4 mg total) under the tongue every 5 (five) minutes as needed for chest pain. 25 tablet 6   tamoxifen (NOLVADEX) 10 MG tablet Take 1 tablet (10 mg total) by mouth daily. 90 tablet 3   triamcinolone (NASACORT ALLERGY 24HR) 55 MCG/ACT AERO nasal inhaler Place 2 sprays into the nose daily as needed.     No current facility-administered medications for this visit.    PHYSICAL EXAMINATION: ECOG PERFORMANCE STATUS: {CHL ONC ECOG [phone]}  There were no vitals filed for this visit. There were no vitals filed for this visit.  BREAST:*** No palpable masses or nodules in either right or left breasts. No palpable axillary supraclavicular or infraclavicular adenopathy no breast tenderness or nipple discharge. (exam performed in the presence of a chaperone)  LABORATORY DATA:  I have reviewed the data as listed    Latest Ref Rng & Units 06/06/2021   12:14 PM 03/14/2020   12:44 PM 03/08/2014   12:31 PM  CMP  Glucose 70 - 99 mg/dL 685  992  341   BUN 8 - 23 mg/dL 9  7  16    Creatinine 0.44 - 1.00 mg/dL 4.43  6.01  6.58   Sodium 135 - 145 mmol/L 138  136  137   Potassium 3.5 - 5.1 mmol/L 3.8  4.0  4.3   Chloride 98 - 111 mmol/L 99  98  100   CO2 22 - 32 mmol/L 28  25  24    Calcium 8.9 - 10.3 mg/dL 7.4  7.6  8.2   Total Protein 6.5 - 8.1 g/dL 7.2     Total Bilirubin 0.3 - 1.2 mg/dL 1.6     Alkaline Phos 38 - 126 U/L 49     AST 15 - 41 U/L 20     ALT 0 - 44 U/L 22       Lab Results  Component Value Date   WBC 11.1 (H) 06/06/2021   HGB 12.9 06/06/2021   HCT 37.5  06/06/2021   MCV 95.9 06/06/2021   PLT 301 06/06/2021   NEUTROABS 5.8 06/06/2021    ASSESSMENT & PLAN:  No problem-specific Assessment & Plan notes found for this encounter.    No orders of the defined types were placed in this encounter.  The patient has a good understanding of the overall plan. she agrees with it. she will call with any problems that may develop before the next visit here. Total time spent: 30 mins including face to face time and time spent for planning, charting and co-ordination of care   Sherlyn Lick, CMA 06/10/22    I Lynnea Vandervoort, Arieliz Latino am acting as a  scribe for Dr.Vinay Gudena  ***

## 2022-06-12 ENCOUNTER — Ambulatory Visit: Payer: Medicare Other | Admitting: Hematology and Oncology

## 2022-06-12 ENCOUNTER — Inpatient Hospital Stay: Payer: Medicare Other | Attending: Hematology and Oncology | Admitting: Hematology and Oncology

## 2022-06-12 ENCOUNTER — Other Ambulatory Visit: Payer: Self-pay

## 2022-06-12 VITALS — BP 157/59 | HR 58 | Temp 97.3°F | Resp 18 | Ht 61.4 in | Wt 143.3 lb

## 2022-06-12 DIAGNOSIS — Z79810 Long term (current) use of selective estrogen receptor modulators (SERMs): Secondary | ICD-10-CM | POA: Diagnosis not present

## 2022-06-12 DIAGNOSIS — D0512 Intraductal carcinoma in situ of left breast: Secondary | ICD-10-CM | POA: Diagnosis not present

## 2022-06-12 NOTE — Assessment & Plan Note (Addendum)
05/21/2021 :Screening mammogram detected left breast mass 0.7 cm by ultrasound, axilla negative, biopsy revealed low-grade to intermediate grade DCIS ER 100%, PR 100%    06/21/2021:Left lumpectomy: Intermediate grade DCIS 0.9 cm, margins negative, ER 100%, PR 100%    Treatment plan: 1.  Adjuvant radiation therapy 08/01/21-08/27/21 2. antiestrogen therapy with tamoxifen daily x5 years (based upon TAM-01 clinical trial we are using lower dosage of tamoxifen at 10 mg daily.  We can go down to 5 mg if needed.)   Tamoxifen toxicities: Tolerating it extremely well without any problems or concerns.  Elevated hemoglobin A1c: I discussed with her that she needs to get this down to decrease her coronary artery disease risk and stroke risk because of thromboembolic risk factor of tamoxifen.  She is trying to get Ozempic covered by her insurance.  Breast cancer surveillance: Breast exam 06/12/2022: Benign Mammograms to be done at [facility]  Return to clinic in 1 year for follow-up

## 2022-06-14 NOTE — Research (Signed)
LATE ENTRY:  MTG-015 - Tissue and Bodily Fluids: Translational Medicine: Discovery and Evaluation of Biomarkers/Pharmacogenomics for the Diagnosis and Personalized Management of Patients   Met with patient for her one year follow up. Gift card provided and signed for. Patient escorted to check out and thanked for her participation.  Margret Chance Yamir Carignan, RN, BSN, [facility]  Her  [facility] Clinical Research Nurse [facility] Direct Dial [phone]  Pager [phone] 06/14/2022 2:53 PM

## 2022-06-26 ENCOUNTER — Encounter: Payer: Self-pay | Admitting: Hematology and Oncology

## 2022-07-15 ENCOUNTER — Other Ambulatory Visit: Payer: Self-pay

## 2022-07-15 ENCOUNTER — Encounter (HOSPITAL_BASED_OUTPATIENT_CLINIC_OR_DEPARTMENT_OTHER): Payer: Self-pay | Admitting: Orthopaedic Surgery

## 2022-07-15 NOTE — Progress Notes (Signed)
   07/15/22 1017  PAT Phone Screen  Is the patient taking a GLP-1 receptor agonist? No  Do You Have Diabetes? Yes  Do You Have Hypertension? Yes  Have You Ever Been to the ER for Asthma? No  Have You Taken Oral Steroids in the Past 3 Months? No  Do you Take Phenteramine or any Other Diet Drugs? No  Recent  Lab Work, EKG, CXR? Yes  Where was this test performed? EKG [facility] 04/12/22  Do you have a history of heart problems? Yes  Cardiologist Name Dr Georgiana Shore [facility]) last OV w/ Maurie Boettcher NP 04/12/22  Have you ever had tests on your heart? Yes  What cardiac tests were performed? EKG;Echo  Results viewable: [facility] Media Tab;Care Everywhere  Any Recent Hospitalizations? No  Height 5' 1.5" (1.562 m)  Weight 63.5 kg  Pat Appointment Scheduled (S)  Yes (BMET)   Above reviewed with Dr Armond Hang- no further clearance needed prior to [facility].

## 2022-07-16 ENCOUNTER — Encounter (HOSPITAL_BASED_OUTPATIENT_CLINIC_OR_DEPARTMENT_OTHER)
Admission: RE | Admit: 2022-07-16 | Discharge: 2022-07-16 | Disposition: A | Discharge status: Home / Self Care | Payer: Medicare Other | Source: Ambulatory Visit | Attending: Orthopaedic Surgery | Admitting: Orthopaedic Surgery

## 2022-07-16 DIAGNOSIS — E119 Type 2 diabetes mellitus without complications: Secondary | ICD-10-CM | POA: Diagnosis not present

## 2022-07-16 DIAGNOSIS — Z01812 Encounter for preprocedural laboratory examination: Secondary | ICD-10-CM | POA: Insufficient documentation

## 2022-07-16 LAB — BASIC METABOLIC PANEL
Anion gap: 12 (ref 5–15)
BUN: 15 mg/dL (ref 8–23)
CO2: 20 mmol/L — ABNORMAL LOW (ref 22–32)
Calcium: 7.9 mg/dL — ABNORMAL LOW (ref 8.9–10.3)
Chloride: 101 mmol/L (ref 98–111)
Creatinine, Ser: 1.07 mg/dL — ABNORMAL HIGH (ref 0.44–1.00)
GFR, Estimated: 55 mL/min — ABNORMAL LOW (ref >60)
Glucose, Bld: 157 mg/dL — ABNORMAL HIGH (ref 70–99)
Potassium: 4.2 mmol/L (ref 3.5–5.1)
Sodium: 133 mmol/L — ABNORMAL LOW (ref 135–145)

## 2022-07-16 NOTE — Progress Notes (Signed)
Surgical soap given with instructions, pt verbalized understanding.  Benzoyl peroxide gel given with written instructions, pt verbalized understanding.  

## 2022-07-17 MED ORDER — LEVOFLOXACIN IN D5W 500 MG/100ML IV SOLN
500.0000 mg | INTRAVENOUS | Status: AC
  Administered 2022-07-18: 500 mg via INTRAVENOUS
  Filled 2022-07-17: qty 100

## 2022-07-17 NOTE — H&P (Signed)
PREOPERATIVE H&P  Chief Complaint: LEFT PROXIMAL HUMERUS FRACTURE  HPI: Sarah Flores is a 77 y.o. female who is scheduled for Procedure(s): REVERSE SHOULDER ARTHROPLASTY.   Patient has a past medical history significant for HTN, breast cancer, DVT, CKD, NASH.   Patient is a 77 year-old female who tripped over a cord at home on 06/26/2022 landing on an outstretched hand and left shoulder.  She states it is very painful.  She was treated conservatively. She notes that she is having increased pain.  She cannot sleep. She feels instability in the left shoulder.    Symptoms are rated as moderate to severe, and have been worsening.  This is significantly impairing activities of daily living.    Please see clinic note for further details on this patient's care.    She has elected for surgical management.   Past Medical History:  Diagnosis Date   Anemia    Arthritis    Breast cancer (HCC)    Cardiogenic shock (HCC)    CKD (chronic kidney disease), stage II    Diabetes mellitus (HCC)    DVT (deep venous thrombosis) (HCC) 03/05/1991   "right arm; from IV I had when I had my hysterectomy; on Coumadin for awhile" (02/18/2013)   Dyslipidemia, goal LDL below 70    Family history of breast cancer 06/06/2021   Family history of ovarian cancer 06/06/2021   Family history of prostate cancer 06/06/2021   Fibromyalgia    History of kidney stones    Hypertension    NASH (nonalcoholic steatohepatitis)    Dr. Eulah Pont in [place]   Palpitations    a. event monitor 07/2013 did not show significant arrhythmia - mostly NSR 70-100, with occasional S Tachy ~100-115, rare PACs & PVCs - not recorded as symptomatic.   ST elevation myocardial infarction (STEMI) of lateral wall (HCC)    a. Post-op STEMI 02/2013 with cardiogenic shock, cath revealing clean cors and LV dysfunction c/w Takasubo Syndrome.   Stroke [facility])    a. CT head at [facility] 03/2014: No acute intracranial abnormality, probable small old  cortical infarct in L posterior parietal region.   Syncope    a. Reported prior near-syncope in 2015 with suggestion of atrial fibrillation but no documentation to support this. An event monitor 07/2013 did not show significant arrhythmia - mostly NSR 70-100, with occasional S Tachy ~100-115, rare PACs & PVCs - not recorded as symptomatic. b. Syncope 03/2014 felt vasovagal.   Takotsubo cardiomyopathy    Vocal cord paralysis 2011   After parathyroidectomy, s/p VF injection- recovered   Past Surgical History:  Procedure Laterality Date   ABDOMINAL HYSTERECTOMY  121992   APPENDECTOMY  02/1966   BACK SURGERY     BREAST LUMPECTOMY WITH RADIOACTIVE SEED LOCALIZATION Left 06/21/2021   Procedure: LEFT BREAST LUMPECTOMY WITH RADIOACTIVE SEED LOCALIZATION;  Surgeon: Griselda Miner, MD;  Location: [facility] OR;  Service: General;  Laterality: Left;   CARDIAC CATHETERIZATION  1990 X2   CARDIAC CATHETERIZATION  02/22/2013   Nonischemic; no notable CAD. EF 40%.   CHOLECYSTECTOMY  ~2001   COLONOSCOPY W/ BIOPSIES     LARYNGOSCOPY     laryngoscopy micro suspension with left Cymetra injection for dysphonia 09/29/09 [facility])   LEFT HEART CATHETERIZATION WITH CORONARY ANGIOGRAM N/A 02/22/2013   Procedure: LEFT HEART CATHETERIZATION WITH CORONARY ANGIOGRAM;  Surgeon: Thurmon Fair, MD;  Location: [facility] CATH LAB;  Service: Cardiovascular; angiographically normal coronary arteries. LV dysfunction consistent with Takotsubo Cardiomyopathy/Syndrome    MAXIMUM  ACCESS (MAS)POSTERIOR LUMBAR INTERBODY FUSION (PLIF) 1 LEVEL N/A 02/18/2013   Procedure: LUMBAR FIVE TO SACRAL ONE MAXIMUM ACCESS (MAS) POSTERIOR LUMBAR INTERBODY FUSION (PLIF) 1 LEVEL;  Surgeon: Tia Alert, MD;  Location: [facility] NEURO ORS;  Service: Neurosurgery;  Laterality: N/A;   PARATHYROIDECTOMY  08/07/2009   [facility]   PLANTAR'S WART EXCISION Bilateral 1960's; 1980   TONSILLECTOMY  1950's   TRANSTHORACIC ECHOCARDIOGRAM  02/18/2013   EF 35-30% with  anterolateral, lateral and inferolateral/apical hypokinesis (Takotsubo)    TRANSTHORACIC ECHOCARDIOGRAM  02/25/2013   EF 55%. No clear regional wall motion abnormalities. Grade 3 diastolic dysfunction. Mild MR   TRANSTHORACIC ECHOCARDIOGRAM  December 2015; January 2016   a. Normal LV size and function. EF 55-60%. Gr1 DD;; b. Normal LV size function with EF 60-65%. No RWM/A no comment on valve lesions or abnormalities. No comment on diastolic function.   TUBAL LIGATION  1979   Social History   Socioeconomic History   Marital status: Married    Spouse name: Not on file   Number of children: Not on file   Years of education: Not on file   Highest education level: Not on file  Occupational History   Occupation: Retired Diplomatic Services operational officer  Tobacco Use   Smoking status: Never   Smokeless tobacco: Never  Vaping Use   Vaping Use: Never used  Substance and Sexual Activity   Alcohol use: No   Drug use: No   Sexual activity: Not Currently  Other Topics Concern   Not on file  Social History Narrative   Lives with husband in [place], [place]. She is a married mother of 2 with 2 grandchildren. She has been married since 61. 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 She does not drink alcohol never smoked. She has not been exercising recently, but wants to get back to walking on a treadmill about 3 days a week.    Social Determinants of Health   Financial Resource Strain: Not on file  Food Insecurity: Not on file  Transportation Needs: Not on file  Physical Activity: Not on file  Stress: Not on file  Social Connections: Not on file   Family History  Problem Relation Age of Onset   Cancer Mother 15   Heart attack Mother 45   Lung cancer Mother    Heart attack Father 68   Leukemia Father 78   Prostate cancer Father 22       metastatic   Heart disease Brother    Ovarian cancer Maternal Aunt        dx after 42   Cancer Maternal Aunt        unknown type; ? lung; dx after 50    Colon cancer Maternal Uncle        dx after 50   Ovarian cancer Paternal Aunt        dx after 12   Brain cancer Maternal Grandmother        dx mid 69s   Heart attack Maternal Grandfather    Diabetes Paternal Grandmother    Heart attack Paternal Grandfather    Breast cancer Cousin        paternal female first cousins; dx 63s; x3 cousins   Allergies  Allergen Reactions   Keflex [Cephalexin] Anaphylaxis   Penicillins Anaphylaxis   Amlodipine Swelling   Benzoin Other (See Comments)    Burning - "looked like a 2nd degree burn"   Prior to Admission medications   Medication Sig Start Date  End Date Taking? Authorizing Provider  ALPRAZolam Prudy Feeler) 0.5 MG tablet Take one by mouth prn anxiety.  Refills after prescription expiration require follow-up evaluation at the office. 02/21/22  Yes [provider]  aspirin EC 81 MG tablet Take 81 mg by mouth daily.   Yes [provider]  atorvastatin (LIPITOR) 10 MG tablet Take 1 tablet by mouth at bedtime. 06/18/21  Yes [provider]  cyanocobalamin (VITAMIN B12) 1000 MCG/ML injection Inject into the muscle. 09/08/21  Yes [provider]  doxazosin (CARDURA) 1 MG tablet Take 1 mg by mouth at bedtime. 11/12/21  Yes [provider]  famotidine (PEPCID) 20 MG tablet Take 1 tablet (20 mg total) by mouth daily. 10/11/21  Yes Kozlow, Alvira Philips, MD  glipiZIDE (GLUCOTROL XL) 10 MG 24 hr tablet Take 10 mg by mouth daily.   Yes [provider]  losartan-hydrochlorothiazide (HYZAAR) 100-25 MG tablet Take 1 tablet by mouth daily. 08/13/21  Yes [provider]  magnesium oxide (MAG-OX) 400 MG tablet Take by mouth.   Yes [provider]  metFORMIN (GLUCOPHAGE-XR) 500 MG 24 hr tablet Take 1,000 mg by mouth 2 (two) times a day.   Yes [provider]  metoprolol succinate (TOPROL-XL) 100 MG 24 hr tablet TAKE 1 TABLET DAILY (TAKE WITH OR IMMEDIATELY FOLLOWING A MEAL) Patient taking differently:  Take 50-100 mg by mouth See admin instructions. 50mg  in the morning and 100mg  in the evening 10/22/16  Yes Marykay Lex, MD  montelukast (SINGULAIR) 10 MG tablet Take one tablet by mouth once daily as directed. 10/11/21  Yes Kozlow, Alvira Philips, MD  tamoxifen (NOLVADEX) 10 MG tablet Take 1 tablet (10 mg total) by mouth daily. 11/29/21  Yes Causey, Larna Daughters, NP  triamcinolone (NASACORT ALLERGY 24HR) 55 MCG/ACT AERO nasal inhaler Place 2 sprays into the nose daily as needed.   Yes [provider]  acetaminophen (TYLENOL) 500 MG tablet Take 1,000 mg by mouth every 6 (six) hours as needed for headache.    [provider]  ipratropium (ATROVENT) 0.06 % nasal spray Place 1-2 sprays into both nostrils every 6 (six) hours as needed for rhinitis.    [provider]  nitroGLYCERIN (NITROSTAT) 0.4 MG SL tablet Place 1 tablet (0.4 mg total) under the tongue every 5 (five) minutes as needed for chest pain. 07/03/18   Marykay Lex, MD    ROS: All other systems have been reviewed and were otherwise negative with the exception of those mentioned in the HPI and as above.  Physical Exam: General: Alert, no acute distress Cardiovascular: No pedal edema Respiratory: No cyanosis, no use of accessory musculature GI: No organomegaly, abdomen is soft and non-tender Skin: No lesions in the area of chief complaint Neurologic: Sensation intact distally Psychiatric: Patient is competent for consent with normal mood and affect Lymphatic: No axillary or cervical lymphadenopathy  MUSCULOSKELETAL:  Examination of the left shoulder reveals significant swelling and bruising of the left upper arm.  She is tender to palpation about the proximal humerus.  No obvious deformity.  Range of motion not tested in the setting of known fracture.  She does endorse axillary nerve sensation.  She states this feels slightly altered to the contralateral side.  It is difficult to test deltoid motor function due  to patient's pain.  Positive motor function AIN, PIN and ulnar distribution.  She endorses distal sensation . Warm and well perfused hand.    Imaging: Two views of the left shoulder demonstrate  stable fracture through the humeral neck and greater tuberosity without further signs of displacement.    BMI: Estimated body mass index is 26.02 kg/m as calculated from the following:   Height as of this encounter: 5' 1.5" (1.562 m).   Weight as of this encounter: 63.5 kg.  Lab Results  Component Value Date   ALBUMIN 4.3 06/06/2021   Diabetes:   Patient has a diagnosis of diabetes,  Lab Results  Component Value Date   HGBA1C 8.8 (H) 03/08/2014   Smoking Status:   reports that she has never smoked. She has never used smokeless tobacco.     Assessment: LEFT PROXIMAL HUMERUS FRACTURE  Plan: Plan for Procedure(s): REVERSE SHOULDER ARTHROPLASTY  At this point the patient is having significant pain with nonoperative management.  There is a high chance of nonunion versus malunion.  We believe the patient will benefit from operative measures at this point, including a left reverse total shoulder arthroplasty for fracture.  Risks, benefits and alternatives have been discussed at length with the patient.  We discussed risks of periprosthetic fracture, nerve injury, bleeding, infection and blood clots.  She does have a high risk of infection due to her history of radiation in this area.  We will close her with nylon sutures.  She is also on tamoxifen, so we will discharge her on Xarelto postoperatively.  We will plan on keeping her overnight, as the patient had some difficulty after her back surgery; however, she may be able to be discharged the same day, depending on how she does postoperatively.  The patient is in agreement with the treatment plan.  All of her questions were answered at length.  We gave her a sling for postoperative care.  The risks benefits and alternatives were discussed with  the patient including but not limited to the risks of nonoperative treatment, versus surgical intervention including infection, bleeding, nerve injury,  blood clots, cardiopulmonary complications, morbidity, mortality, among others, and they were willing to proceed.   We additionally specifically discussed risks of axillary nerve injury, infection, periprosthetic fracture, continued pain and longevity of implants prior to beginning procedure.    Patient will be closely monitored in PACU for medical stabilization and pain control. If found stable in PACU, patient may be discharged home with outpatient follow-up. If any concerns regarding patient's stabilization patient will be admitted for observation after surgery. The patient is planning to be discharged home with outpatient PT.   The patient acknowledged the explanation, agreed to proceed with the plan and consent was signed.   She received cardiac clearance from, Letta Moynahan NP.   Operative Plan: Left reverse total shoulder arthroplasty for fracture Discharge Medications: standard DVT Prophylaxis: xarelto Physical Therapy: outpatient PT - delayed Special Discharge needs: Sling (should bring with her). IceMan   Vernetta Honey, PA-C  07/17/2022 3:56 PM

## 2022-07-17 NOTE — Discharge Instructions (Signed)
Ramond Marrow MD, MPH Alfonse Alpers, PA-C [facility] [address] [phone] (tel)   [phone] (fax)   POST-OPERATIVE INSTRUCTIONS - TOTAL SHOULDER REPLACEMENT    WOUND CARE You may leave the operative dressing in place until your follow-up appointment. KEEP THE INCISIONS CLEAN AND DRY. There may be a small amount of fluid/bleeding leaking at the surgical site. This is normal after surgery.  If it fills with liquid or blood please call us immediately to change it for you. Use the provided ice machine or Ice packs as often as possible for the first 3-4 days, then as needed for pain relief.   Keep a layer of cloth or a shirt between your skin and the cooling unit to prevent frost bite as it can get very cold.  SHOWERING: - You may shower on Post-Op Day #2.  - The dressing is water resistant but do not scrub it as it may start to peel up.   - You may remove the sling for showering - Gently pat the area dry.  - Do not soak the shoulder in water.  - Do not go swimming in the pool or ocean until your incision has completely healed (about 4-6 weeks after surgery) - KEEP THE INCISIONS CLEAN AND DRY.  EXERCISES Wear the sling at all times  You may remove the sling for showering, but keep the arm across the chest or in a secondary sling.    Accidental/Purposeful External Rotation and shoulder flexion (reaching behind you) is to be avoided at all costs for the first month. It is ok to come out of your sling if your are sitting and have assistance for eating.   Do not lift anything heavier than 1 pound until we discuss it further in clinic.  It is normal for your fingers/hand to become more swollen after surgery and discolored from bruising.   This will resolve over the first few weeks usually after surgery. Please continue to ambulate and do not stay sitting or lying for too long.  Perform foot and wrist pumps to assist in circulation.  PHYSICAL  THERAPY - No physical therapy for 4 weeks after surgery  REGIONAL ANESTHESIA (NERVE BLOCKS) The anesthesia team may have performed a nerve block for you this is a great tool used to minimize pain.   The block may start wearing off overnight (between 8-24 hours postop) When the block wears off, your pain may go from nearly zero to the pain you would have had postop without the block. This is an abrupt transition but nothing dangerous is happening.   This can be a challenging period but utilize your as needed pain medications to try and manage this period. We suggest you use the pain medication the first night prior to going to bed, to ease this transition.  You may take an extra dose of narcotic when this happens if needed   POST-OP MEDICATIONS- Multimodal approach to pain control In general your pain will be controlled with a combination of substances.  Prescriptions unless otherwise discussed are electronically sent to your pharmacy.  This is a carefully made plan we use to minimize narcotic use.      Acetaminophen - Non-narcotic pain medicine taken on a scheduled basis  Gabapentin - this is to help with nerve based pain, take on a scheduled basis Oxycodone - This is a strong narcotic, to be used only on an "as needed" basis for SEVERE pain. Xarelto - This medicine is used  to minimize the risk of blood clots after surgery. Zofran - take this as needed for nausea/vomiting  FOLLOW-UP If you develop a Fever (>101.5), Redness or Drainage from the surgical incision site, please call our office to arrange for an evaluation. Please call the office to schedule a follow-up appointment for a wound check, 7-10 days post-operatively.  IF YOU HAVE ANY QUESTIONS, PLEASE FEEL FREE TO CALL OUR OFFICE.  HELPFUL INFORMATION  Your arm will be in a sling following surgery. You will be in this sling for the next 4 weeks.   You may be more comfortable sleeping in a semi-seated position the first few  nights following surgery.  Keep a pillow propped under the elbow and forearm for comfort.  If you have a recliner type of chair it might be beneficial.  If not that is fine too, but it would be helpful to sleep propped up with pillows behind your operated shoulder as well under your elbow and forearm.  This will reduce pulling on the suture lines.  When dressing, put your operative arm in the sleeve first.  When getting undressed, take your operative arm out last.  Loose fitting, button-down shirts are recommended.  In most states it is against the law to drive while your arm is in a sling. And certainly against the law to drive while taking narcotics.  You may return to work/school in the next couple of days when you feel up to it. Desk work and typing in the sling is fine.  We suggest you use the pain medication the first night prior to going to bed, in order to ease any pain when the anesthesia wears off. You should avoid taking pain medications on an empty stomach as it will make you nauseous.  You should wean off your narcotic medicines as soon as you are able.     Most patients will be off narcotics before their first postop appointment.   Do not drink alcoholic beverages or take illicit drugs when taking pain medications.  Pain medication may make you constipated.  Below are a few solutions to try in this order: Decrease the amount of pain medication if you aren't having pain. Drink lots of decaffeinated fluids. Drink prune juice and/or each dried prunes  If the first 3 don't work start with additional solutions Take Colace - an over-the-counter stool softener Take Senokot - an over-the-counter laxative Take Miralax - a stronger over-the-counter laxative   Dental Antibiotics:  In most cases prophylactic antibiotics for Dental procdeures after total joint surgery are not necessary.  Exceptions are as follows:  1. History of prior total joint infection  2. Severely  immunocompromised (Organ Transplant, cancer chemotherapy, Rheumatoid biologic meds such as Humera)  3. Poorly controlled diabetes (A1C &gt; 8.0, blood glucose over 200)  If you have one of these conditions, contact your surgeon for an antibiotic prescription, prior to your dental procedure.   For more information including helpful videos and documents visit our website:   [email or web address]    Post Anesthesia Home Care Instructions  Activity: Get plenty of rest for the remainder of the day. A responsible individual must stay with you for 24 hours following the procedure.  For the next 24 hours, DO NOT: -Drive a car -Advertising copywriter -Drink alcoholic beverages -Take any medication unless instructed by your physician -Make any legal decisions or sign important papers.  Meals: Start with liquid foods such as gelatin or soup. Progress to regular foods as tolerated. Avoid greasy, spicy,  heavy foods. If nausea and/or vomiting occur, drink only clear liquids until the nausea and/or vomiting subsides. Call your physician if vomiting continues.  Special Instructions/Symptoms: Your throat may feel dry or sore from the anesthesia or the breathing tube placed in your throat during surgery. If this causes discomfort, gargle with warm salt water. The discomfort should disappear within 24 hours.  Information for Discharge Teaching: EXPAREL (bupivacaine liposome injectable suspension)   Your surgeon or anesthesiologist gave you EXPAREL(bupivacaine) to help control your pain after surgery.  EXPAREL is a local anesthetic that provides pain relief by numbing the tissue around the surgical site. EXPAREL is designed to release pain medication over time and can control pain for up to 72 hours. Depending on how you respond to EXPAREL, you may require less pain medication during your recovery.  Possible side effects: Temporary loss of sensation or ability to move  in the area where bupivacaine was injected. Nausea, vomiting, constipation Rarely, numbness and tingling in your mouth or lips, lightheadedness, or anxiety may occur. Call your doctor right away if you think you may be experiencing any of these sensations, or if you have other questions regarding possible side effects.  Follow all other discharge instructions given to you by your surgeon or nurse. Eat a healthy diet and drink plenty of water or other fluids.  If you return to the hospital for any reason within 96 hours following the administration of EXPAREL, it is important for health care providers to know that you have received this anesthetic. A teal colored band has been placed on your arm with the date, time and amount of EXPAREL you have received in order to alert and inform your health care providers. Please leave this armband in place for the full 96 hours following administration, and then you may remove the band.  Regional Anesthesia Blocks  1. Numbness or the inability to move the "blocked" extremity may last from 3-48 hours after placement. The length of time depends on the medication injected and your individual response to the medication. If the numbness is not going away after 48 hours, call your surgeon.  2. The extremity that is blocked will need to be protected until the numbness is gone and the  Strength has returned. Because you cannot feel it, you will need to take extra care to avoid injury. Because it may be weak, you may have difficulty moving it or using it. You may not know what position it is in without looking at it while the block is in effect.  3. For blocks in the legs and feet, returning to weight bearing and walking needs to be done carefully. You will need to wait until the numbness is entirely gone and the strength has returned. You should be able to move your leg and foot normally before you try and bear weight or walk. You will need someone to be with you when you  first try to ensure you do not fall and possibly risk injury.  4. Bruising and tenderness at the needle site are common side effects and will resolve in a few days.  5. Persistent numbness or new problems with movement should be communicated to the surgeon or the [facility] [phone] [facility] [phone]).  Donjoy Ultrasling III (Red ball):  Please contact your surgeon if you have questions or concerns about your sling.   Donjoy Ultrasling III (Red ball):  Please contact your surgeon if you have questions or concerns about your  sling.    Can have tylenol after 1 pm if needed

## 2022-07-18 ENCOUNTER — Other Ambulatory Visit: Payer: Self-pay

## 2022-07-18 ENCOUNTER — Ambulatory Visit (HOSPITAL_COMMUNITY): Payer: Medicare Other

## 2022-07-18 ENCOUNTER — Ambulatory Visit (HOSPITAL_BASED_OUTPATIENT_CLINIC_OR_DEPARTMENT_OTHER)
Admission: RE | Admit: 2022-07-18 | Discharge: 2022-07-18 | Disposition: A | Discharge status: Home / Self Care | Payer: Medicare Other | Attending: Orthopaedic Surgery | Admitting: Orthopaedic Surgery

## 2022-07-18 ENCOUNTER — Ambulatory Visit (HOSPITAL_BASED_OUTPATIENT_CLINIC_OR_DEPARTMENT_OTHER): Payer: Medicare Other | Admitting: Anesthesiology

## 2022-07-18 ENCOUNTER — Encounter (HOSPITAL_BASED_OUTPATIENT_CLINIC_OR_DEPARTMENT_OTHER)
Admission: RE | Disposition: A | Discharge status: Home / Self Care | Payer: Self-pay | Source: Home / Self Care | Attending: Orthopaedic Surgery

## 2022-07-18 ENCOUNTER — Encounter (HOSPITAL_BASED_OUTPATIENT_CLINIC_OR_DEPARTMENT_OTHER): Payer: Self-pay | Admitting: Orthopaedic Surgery

## 2022-07-18 DIAGNOSIS — Z8673 Personal history of transient ischemic attack (TIA), and cerebral infarction without residual deficits: Secondary | ICD-10-CM | POA: Diagnosis not present

## 2022-07-18 DIAGNOSIS — I129 Hypertensive chronic kidney disease with stage 1 through stage 4 chronic kidney disease, or unspecified chronic kidney disease: Secondary | ICD-10-CM | POA: Diagnosis not present

## 2022-07-18 DIAGNOSIS — S42202A Unspecified fracture of upper end of left humerus, initial encounter for closed fracture: Secondary | ICD-10-CM | POA: Insufficient documentation

## 2022-07-18 DIAGNOSIS — Z7984 Long term (current) use of oral hypoglycemic drugs: Secondary | ICD-10-CM

## 2022-07-18 DIAGNOSIS — Z79810 Long term (current) use of selective estrogen receptor modulators (SERMs): Secondary | ICD-10-CM | POA: Diagnosis not present

## 2022-07-18 DIAGNOSIS — E1122 Type 2 diabetes mellitus with diabetic chronic kidney disease: Secondary | ICD-10-CM | POA: Insufficient documentation

## 2022-07-18 DIAGNOSIS — W1809XA Striking against other object with subsequent fall, initial encounter: Secondary | ICD-10-CM | POA: Insufficient documentation

## 2022-07-18 DIAGNOSIS — N182 Chronic kidney disease, stage 2 (mild): Secondary | ICD-10-CM | POA: Diagnosis not present

## 2022-07-18 DIAGNOSIS — I10 Essential (primary) hypertension: Secondary | ICD-10-CM | POA: Diagnosis not present

## 2022-07-18 DIAGNOSIS — Z86718 Personal history of other venous thrombosis and embolism: Secondary | ICD-10-CM | POA: Insufficient documentation

## 2022-07-18 DIAGNOSIS — E119 Type 2 diabetes mellitus without complications: Secondary | ICD-10-CM

## 2022-07-18 DIAGNOSIS — I2510 Atherosclerotic heart disease of native coronary artery without angina pectoris: Secondary | ICD-10-CM

## 2022-07-18 DIAGNOSIS — K219 Gastro-esophageal reflux disease without esophagitis: Secondary | ICD-10-CM | POA: Insufficient documentation

## 2022-07-18 DIAGNOSIS — K7581 Nonalcoholic steatohepatitis (NASH): Secondary | ICD-10-CM | POA: Diagnosis not present

## 2022-07-18 DIAGNOSIS — I252 Old myocardial infarction: Secondary | ICD-10-CM | POA: Diagnosis not present

## 2022-07-18 DIAGNOSIS — Y92009 Unspecified place in unspecified non-institutional (private) residence as the place of occurrence of the external cause: Secondary | ICD-10-CM | POA: Diagnosis not present

## 2022-07-18 HISTORY — PX: REVERSE SHOULDER ARTHROPLASTY: SHX5054

## 2022-07-18 HISTORY — PX: ORIF HUMERUS FRACTURE: SHX2126

## 2022-07-18 LAB — GLUCOSE, CAPILLARY
Glucose-Capillary: 143 mg/dL — ABNORMAL HIGH (ref 70–99)
Glucose-Capillary: 160 mg/dL — ABNORMAL HIGH (ref 70–99)

## 2022-07-18 SURGERY — ARTHROPLASTY, SHOULDER, TOTAL, REVERSE
Anesthesia: General | Site: Shoulder | Laterality: Left

## 2022-07-18 MED ORDER — SODIUM CHLORIDE (PF) 0.9 % IJ SOLN
INTRAMUSCULAR | Status: DC | PRN
  Administered 2022-07-18: 1000 mL

## 2022-07-18 MED ORDER — VANCOMYCIN HCL IN DEXTROSE 1-5 GM/200ML-% IV SOLN
INTRAVENOUS | Status: AC
  Filled 2022-07-18: qty 200

## 2022-07-18 MED ORDER — LACTATED RINGERS IV SOLN: INTRAVENOUS | Status: DC

## 2022-07-18 MED ORDER — SUGAMMADEX SODIUM 200 MG/2ML IV SOLN
INTRAVENOUS | Status: DC | PRN
  Administered 2022-07-18: 200 mg via INTRAVENOUS

## 2022-07-18 MED ORDER — GABAPENTIN 300 MG PO CAPS
ORAL_CAPSULE | ORAL | Status: AC
  Filled 2022-07-18: qty 1

## 2022-07-18 MED ORDER — PROPOFOL 10 MG/ML IV BOLUS
INTRAVENOUS | Status: DC | PRN
  Administered 2022-07-18: 120 mg via INTRAVENOUS

## 2022-07-18 MED ORDER — 0.9 % SODIUM CHLORIDE (POUR BTL) OPTIME
TOPICAL | Status: DC | PRN
  Administered 2022-07-18: 1000 mL

## 2022-07-18 MED ORDER — FENTANYL CITRATE (PF) 100 MCG/2ML IJ SOLN
INTRAMUSCULAR | Status: AC
  Filled 2022-07-18: qty 2

## 2022-07-18 MED ORDER — LIDOCAINE 2% (20 MG/ML) 5 ML SYRINGE
INTRAMUSCULAR | Status: DC | PRN
  Administered 2022-07-18: 60 mg via INTRAVENOUS

## 2022-07-18 MED ORDER — DEXAMETHASONE SODIUM PHOSPHATE 10 MG/ML IJ SOLN
INTRAMUSCULAR | Status: DC | PRN
  Administered 2022-07-18: 5 mg via INTRAVENOUS

## 2022-07-18 MED ORDER — GABAPENTIN 100 MG PO CAPS: 100.0000 mg | ORAL_CAPSULE | Freq: Three times a day (TID) | ORAL | 0 refills | Status: AC

## 2022-07-18 MED ORDER — VANCOMYCIN HCL 1000 MG IV SOLR
INTRAVENOUS | Status: DC | PRN
  Administered 2022-07-18: 1000 mg via TOPICAL

## 2022-07-18 MED ORDER — GABAPENTIN 300 MG PO CAPS
300.0000 mg | ORAL_CAPSULE | Freq: Once | ORAL | Status: AC
  Administered 2022-07-18: 300 mg via ORAL

## 2022-07-18 MED ORDER — PHENYLEPHRINE HCL-NACL 20-0.9 MG/250ML-% IV SOLN
INTRAVENOUS | Status: DC | PRN
  Administered 2022-07-18: 25 ug/min via INTRAVENOUS

## 2022-07-18 MED ORDER — TRANEXAMIC ACID-NACL 1000-0.7 MG/100ML-% IV SOLN
1000.0000 mg | INTRAVENOUS | Status: AC
  Administered 2022-07-18: 1000 mg via INTRAVENOUS

## 2022-07-18 MED ORDER — PHENYLEPHRINE HCL (PRESSORS) 10 MG/ML IV SOLN
INTRAVENOUS | Status: AC
  Filled 2022-07-18: qty 1

## 2022-07-18 MED ORDER — TRANEXAMIC ACID-NACL 1000-0.7 MG/100ML-% IV SOLN
INTRAVENOUS | Status: AC
  Filled 2022-07-18: qty 100

## 2022-07-18 MED ORDER — FENTANYL CITRATE (PF) 100 MCG/2ML IJ SOLN
100.0000 ug | Freq: Once | INTRAMUSCULAR | Status: AC
  Administered 2022-07-18: 100 ug via INTRAVENOUS

## 2022-07-18 MED ORDER — BUPIVACAINE HCL (PF) 0.5 % IJ SOLN
INTRAMUSCULAR | Status: DC | PRN
  Administered 2022-07-18: 10 mL via PERINEURAL

## 2022-07-18 MED ORDER — PROPOFOL 10 MG/ML IV BOLUS
INTRAVENOUS | Status: AC
  Filled 2022-07-18: qty 20

## 2022-07-18 MED ORDER — EPHEDRINE SULFATE (PRESSORS) 50 MG/ML IJ SOLN
INTRAMUSCULAR | Status: DC | PRN
  Administered 2022-07-18: 10 mg via INTRAVENOUS
  Administered 2022-07-18: 5 mg via INTRAVENOUS
  Administered 2022-07-18 (×2): 10 mg via INTRAVENOUS
  Administered 2022-07-18: 5 mg via INTRAVENOUS

## 2022-07-18 MED ORDER — EPHEDRINE 5 MG/ML INJ
INTRAVENOUS | Status: AC
  Filled 2022-07-18: qty 5

## 2022-07-18 MED ORDER — EPINEPHRINE PF 1 MG/ML IJ SOLN
INTRAMUSCULAR | Status: AC
  Filled 2022-07-18: qty 1

## 2022-07-18 MED ORDER — ACETAMINOPHEN 500 MG PO TABS
ORAL_TABLET | ORAL | Status: AC
  Filled 2022-07-18: qty 2

## 2022-07-18 MED ORDER — ONDANSETRON HCL 4 MG PO TABS: 4.0000 mg | ORAL_TABLET | Freq: Three times a day (TID) | ORAL | 0 refills | Status: AC | PRN

## 2022-07-18 MED ORDER — ONDANSETRON HCL 4 MG/2ML IJ SOLN
INTRAMUSCULAR | Status: DC | PRN
  Administered 2022-07-18: 4 mg via INTRAVENOUS

## 2022-07-18 MED ORDER — ONDANSETRON HCL 4 MG/2ML IJ SOLN
INTRAMUSCULAR | Status: AC
  Filled 2022-07-18: qty 2

## 2022-07-18 MED ORDER — LIDOCAINE 2% (20 MG/ML) 5 ML SYRINGE
INTRAMUSCULAR | Status: AC
  Filled 2022-07-18: qty 5

## 2022-07-18 MED ORDER — BUPIVACAINE LIPOSOME 1.3 % IJ SUSP
INTRAMUSCULAR | Status: DC | PRN
  Administered 2022-07-18: 10 mL via PERINEURAL

## 2022-07-18 MED ORDER — VANCOMYCIN HCL 1000 MG IV SOLR
INTRAVENOUS | Status: AC
  Filled 2022-07-18: qty 20

## 2022-07-18 MED ORDER — FENTANYL CITRATE (PF) 100 MCG/2ML IJ SOLN
INTRAMUSCULAR | Status: DC | PRN
  Administered 2022-07-18: 50 ug via INTRAVENOUS

## 2022-07-18 MED ORDER — ACETAMINOPHEN 500 MG PO TABS
1000.0000 mg | ORAL_TABLET | Freq: Once | ORAL | Status: AC
  Administered 2022-07-18: 1000 mg via ORAL

## 2022-07-18 MED ORDER — MIDAZOLAM HCL 2 MG/2ML IJ SOLN
INTRAMUSCULAR | Status: AC
  Filled 2022-07-18: qty 2

## 2022-07-18 MED ORDER — DEXAMETHASONE SODIUM PHOSPHATE 10 MG/ML IJ SOLN
INTRAMUSCULAR | Status: AC
  Filled 2022-07-18: qty 1

## 2022-07-18 MED ORDER — RIVAROXABAN 10 MG PO TABS: 10.0000 mg | ORAL_TABLET | Freq: Every day | ORAL | 0 refills | Status: AC

## 2022-07-18 MED ORDER — ROCURONIUM BROMIDE 100 MG/10ML IV SOLN
INTRAVENOUS | Status: DC | PRN
  Administered 2022-07-18: 50 mg via INTRAVENOUS

## 2022-07-18 MED ORDER — ROCURONIUM BROMIDE 10 MG/ML (PF) SYRINGE
PREFILLED_SYRINGE | INTRAVENOUS | Status: AC
  Filled 2022-07-18: qty 10

## 2022-07-18 MED ORDER — ACETAMINOPHEN 500 MG PO TABS: 1000.0000 mg | ORAL_TABLET | Freq: Three times a day (TID) | ORAL | 0 refills | Status: AC

## 2022-07-18 MED ORDER — OXYCODONE HCL 5 MG PO TABS: ORAL_TABLET | ORAL | 0 refills | Status: AC

## 2022-07-18 MED ORDER — VANCOMYCIN HCL IN DEXTROSE 1-5 GM/200ML-% IV SOLN
1000.0000 mg | INTRAVENOUS | Status: AC
  Administered 2022-07-18: 1000 mg via INTRAVENOUS

## 2022-07-18 SURGICAL SUPPLY — 67 items
AID PSTN UNV HD RSTRNT DISP (MISCELLANEOUS) ×1
APL PRP STRL LF DISP 70% ISPRP (MISCELLANEOUS) ×1
BASEPLATE GLENOID RSA 3X25 0D (Shoulder) IMPLANT
BIT DRILL 3.2 PERIPHERAL SCREW (BIT) IMPLANT
BLADE SAW SGTL 73X25 THK (BLADE) ×1 IMPLANT
BLADE SURG 10 STRL SS (BLADE) IMPLANT
BLADE SURG 15 STRL LF DISP TIS (BLADE) IMPLANT
BLADE SURG 15 STRL SS (BLADE)
BRUSH SCRUB EZ PLAIN DRY (MISCELLANEOUS) ×1 IMPLANT
BSPLAT GLND +3 25 (Shoulder) ×1 IMPLANT
CHLORAPREP W/TINT 26 (MISCELLANEOUS) ×1 IMPLANT
CLSR STERI-STRIP ANTIMIC 1/2X4 (GAUZE/BANDAGES/DRESSINGS) ×1 IMPLANT
COOLER ICEMAN CLASSIC (MISCELLANEOUS) ×1 IMPLANT
COVER BACK TABLE 60X90IN (DRAPES) ×1 IMPLANT
COVER MAYO STAND STRL (DRAPES) ×1 IMPLANT
DRAPE IMP U-DRAPE 54X76 (DRAPES) IMPLANT
DRAPE INCISE IOBAN 66X45 STRL (DRAPES) ×1 IMPLANT
DRAPE POUCH INSTRU U-SHP 10X18 (DRAPES) ×1 IMPLANT
DRAPE U-SHAPE 76X120 STRL (DRAPES) ×2 IMPLANT
DRSG AQUACEL AG ADV 3.5X 6 (GAUZE/BANDAGES/DRESSINGS) ×1 IMPLANT
ELECT BLADE 4.0 EZ CLEAN MEGAD (MISCELLANEOUS) ×1
ELECT REM PT RETURN 9FT ADLT (ELECTROSURGICAL) ×1
ELECTRODE BLDE 4.0 EZ CLN MEGD (MISCELLANEOUS) ×1 IMPLANT
ELECTRODE REM PT RTRN 9FT ADLT (ELECTROSURGICAL) ×1 IMPLANT
FACESHIELD WRAPAROUND (MASK) ×2 IMPLANT
FACESHIELD WRAPAROUND OR TEAM (MASK) ×2 IMPLANT
GAUZE XEROFORM 1X8 LF (GAUZE/BANDAGES/DRESSINGS) IMPLANT
GLENOSPHERE REV SHOULDER 36 (Joint) IMPLANT
GLOVE BIO SURGEON STRL SZ 6.5 (GLOVE) ×2 IMPLANT
GLOVE BIOGEL PI IND STRL 6.5 (GLOVE) ×1 IMPLANT
GLOVE BIOGEL PI IND STRL 8 (GLOVE) ×1 IMPLANT
GLOVE ECLIPSE 8.0 STRL XLNG CF (GLOVE) ×2 IMPLANT
GOWN STRL REUS W/ TWL LRG LVL3 (GOWN DISPOSABLE) ×2 IMPLANT
GOWN STRL REUS W/TWL LRG LVL3 (GOWN DISPOSABLE) ×2
GOWN STRL REUS W/TWL XL LVL3 (GOWN DISPOSABLE) ×1 IMPLANT
GUIDEWIRE GLENOID 2.5X220 (WIRE) IMPLANT
HANDPIECE INTERPULSE COAX TIP (DISPOSABLE) ×1
INSERT HUM PERFORM 3/4 36 +0 (Insert) IMPLANT
KIT STABILIZATION SHOULDER (MISCELLANEOUS) ×1 IMPLANT
MANIFOLD NEPTUNE II (INSTRUMENTS) ×1 IMPLANT
NDL MAYO TROCAR (NEEDLE) IMPLANT
NEEDLE MAYO TROCAR (NEEDLE) ×1 IMPLANT
PACK BASIN DAY SURGERY FS (CUSTOM PROCEDURE TRAY) ×1 IMPLANT
PACK SHOULDER (CUSTOM PROCEDURE TRAY) ×1 IMPLANT
PAD COLD SHLDR WRAP-ON (PAD) ×1 IMPLANT
RESTRAINT HEAD UNIVERSAL NS (MISCELLANEOUS) ×1 IMPLANT
SCREW 5.5X22 (Screw) IMPLANT
SCREW 5.5X26 (Screw) IMPLANT
SCREW BONE THREAD 6.5X35 (Screw) IMPLANT
SET HNDPC FAN SPRY TIP SCT (DISPOSABLE) ×1 IMPLANT
SHEET MEDIUM DRAPE 40X70 STRL (DRAPES) ×1 IMPLANT
SLEEVE SCD COMPRESS KNEE MED (STOCKING) ×1 IMPLANT
SPIKE FLUID TRANSFER (MISCELLANEOUS) IMPLANT
SPONGE T-LAP 18X18 ~~LOC~~+RFID (SPONGE) ×1 IMPLANT
STEM FRACTURE STD SZ10 L 130 (Orthopedic Implant) IMPLANT
SUT ETHIBOND 2 V 37 (SUTURE) ×1 IMPLANT
SUT ETHIBOND NAB CT1 #1 30IN (SUTURE) ×1 IMPLANT
SUT ETHILON 3 0 PS 1 (SUTURE) IMPLANT
SUT FIBERWIRE #5 38 CONV NDL (SUTURE) ×6
SUT MNCRL AB 4-0 PS2 18 (SUTURE) ×1 IMPLANT
SUT VIC AB 0 CT1 27 (SUTURE)
SUT VIC AB 0 CT1 27XBRD ANBCTR (SUTURE) IMPLANT
SUT VIC AB 3-0 SH 27 (SUTURE) ×1
SUT VIC AB 3-0 SH 27X BRD (SUTURE) ×1 IMPLANT
SUTURE FIBERWR #5 38 CONV NDL (SUTURE) ×2 IMPLANT
TOWEL GREEN STERILE FF (TOWEL DISPOSABLE) ×3 IMPLANT
TUBE SUCTION HIGH CAP CLEAR NV (SUCTIONS) ×1 IMPLANT

## 2022-07-18 NOTE — Transfer of Care (Signed)
Immediate Anesthesia Transfer of Care Note  Patient: HIDEKO HORNSTEIN  Procedure(s) Performed: REVERSE SHOULDER ARTHROPLASTY (Left: Shoulder) OPEN REDUCTION INTERNAL FIXATION (ORIF) PROXIMAL HUMERUS FRACTURE (Left: Shoulder)  Patient Location: PACU  Anesthesia Type:GA combined with regional for post-op pain  Level of Consciousness: drowsy  Airway & Oxygen Therapy: Patient Spontanous Breathing and Patient connected to face mask oxygen  Post-op Assessment: Report given to RN and Post -op Vital signs reviewed and stable  Post vital signs: Reviewed and stable  Last Vitals:  Vitals Value Taken Time  BP 147/50 07/18/22 0936  Temp    Pulse 79 07/18/22 0938  Resp 19 07/18/22 0938  SpO2 98 % 07/18/22 0938  Vitals shown include unvalidated device data.  Last Pain:  Vitals:   07/18/22 0657  TempSrc: Oral  PainSc: 7       Patients Stated Pain Goal: 5 (07/18/22 0657)  Complications: No notable events documented.

## 2022-07-18 NOTE — Op Note (Signed)
Orthopaedic Surgery Operative Note (CSN: [id number])  Sarah Flores  [birth year] Date of Surgery: 07/18/2022   Diagnoses:  Left proximal humerus fracture  Procedure: Left reverse total Shoulder Arthroplasty Left shoulder open reduction term fixation of tuberosities   Operative Finding Successful completion of planned procedure.  Patient had reasonable bone quality of her tuberosities however her head was completely detached from the tuberosities, lesser and greater.  Were able to mobilize these and perform a reverse with good fixation.  We did not feel that we needed an interlocking screw as the stem was quite well fit.  Stability was robust in the rotator cuff repair/ORIF tuberosities was anatomic.  Post-operative plan: The patient will be NWB in sling.  The patient will be will be discharged from PACU if continues to be stable as was plan prior to surgery.  DVT prophylaxis Xarelto 10 mg/day.  Pain control with PRN pain medication preferring oral medicines.  Follow up plan will be scheduled in approximately 7 days for incision check and XR.  Physical therapy to start after 4 weeks.  Implants: Tornier perform humeral fracture stem size 10 S, 0 polyethylene, 36 standard glenosphere, 25+3 baseplate with a 35 center screw and 2 peripheral locking screws  Post-Op Diagnosis: Same Surgeons:Primary: Bjorn Pippin, MD Assistants:Caroline McBane PA-C Location: [facility] OR ROOM 1 Anesthesia: General with Exparel Interscalene Antibiotics: Levaquin and vancomycin IV, Vancomycin 1000mg  locally Tourniquet time: None Estimated Blood Loss: 100 Complications: None Specimens: None Implants: Implant Name Type Inv. Item Serial No. Manufacturer Lot No. LRB No. Used Action  BASEPLATE GLENOID RSA 3X25 0D - [id number] Shoulder BASEPLATE GLENOID RSA 3X25 0D [id number] [employer]  Left 1 Implanted  GLENOSPHERE REV SHOULDER 36 - [id number] Joint GLENOSPHERE REV SHOULDER 36 [id number] [employer]  Left 1 Implanted   SCREW BONE THREAD 6.5X35 - [id number] Screw SCREW BONE THREAD 6.5X35  [employer] ON STERILE TRAY Left 1 Implanted  SCREW 5.5X22 - [id number] Screw SCREW 5.5X22  [employer] ON STERILE TRAY Left 1 Implanted  SCREW 5.5X26 - [id number] Screw SCREW 5.5X26  [employer] ON STERILE TRAY Left 1 Implanted  Fracture Stem, STD. Size 10   [id number] [employer]  Left 1 Implanted  Reversed Insert, thickness +0 mm   [id number]   Left 1 Implanted    Indications for Surgery:   Sarah Flores is a 77 y.o. female with fall resulting in a fracture 3 weeks ago with progressive loss of reduction.  Benefits and risks of operative and nonoperative management were discussed prior to surgery with patient/guardian(s) and informed consent form was completed.  Infection and need for further surgery were discussed as was prosthetic stability and cuff issues.  We additionally specifically discussed risks of axillary nerve injury, infection, periprosthetic fracture, continued pain and longevity of implants prior to beginning procedure.      Procedure:   The patient was identified in the preoperative holding area where the surgical site was marked. Block placed by anesthesia with exparel.  The patient was taken to the OR where a procedural timeout was called and the above noted anesthesia was induced.  The patient was positioned beachchair on allen table with spider arm positioner.  Preoperative antibiotics were dosed.  The patient's left shoulder was prepped and draped in the usual sterile fashion.  A second preoperative timeout was called.       Standard deltopectoral approach was performed with a #10 blade. We dissected down to the subcutaneous tissues and the cephalic vein was taken laterally  with the deltoid. Clavipectoral fascia was incised in line with the incision. Deep retractors were placed. The long of the biceps tendon was identified and there was significant tenosynovitis present.  Tenodesis was performed to the  pectoralis tendon with #2 Ethibond. The remaining biceps was followed up into the rotator interval where it was released.    We used the bicipital groove as a landmark for the lesser and greater tuberosity fragments.  We were able to mobilize the lesser tuberosity fragment and placed stay sutures in the bone tendon junction to help with mobilization.  This point we were able to identify the  greater tuberosity fragment and 4 #5  FiberWire sutures were used to place into this for eventual repair of the tuberosities.  Once these were both mobilized we took care to identify the shaft fragment as well as the head fragment.  We carefully identified the head fragment were able to manually remove it.  At this point the axillary nerve was found and palpated and with a tug test noted to be intact.  Protected throughout the remainder of the case with blunt retractors.    We then released the SGHL with bovie cautery prior to placing a curved mayo at the junction of the anterior glenoid well above the axillary nerve and bluntly dissecting the subscapularis from the capsule.  We then carefully protected the axillary nerve as we gently released the inferior capsule to fully mobilize the subscapularis.  An anterior deltoid retractor was then placed as well as a small Hohmann retractor superiorly.   The glenoid was relatively preserved as we would expect in this fracture patient.  The remaining labrum was removed circumferentially taking great care not to disrupt the posterior capsule.    The glenoid drill guide was placed and used to drill a guide pin in the center, inferior position. The glenoid face was then reamed concentrically over the guide wire. The center hole was drilled over the guidepin in a near anatomic angle of version. Next the glenoid vault was drilled back to a depth of 35 mm.  We tapped and then placed a 25mm size baseplate with 3 lateralization was selected with a 6.5 mm x 35mm length central screw.  The  base plate was screwed into the glenoid vault obtaining secure fixation. We next placed superior and inferior locking screws for additional fixation.  Next a 36 mm glenosphere was selected and impacted onto the baseplate. The center screw was tightened.   We then repositioned the arm to give access to the humeral shaft fragment.  Drill holes were placed and fiberwire sutures in the shaft for vertical fixation of the tuberosities.  We broached with the Perform humeral fracture stem implants  up to size listed above which obtained an appropriate fit.    We trialed with multiple size tray and polyethylene options and selected a 0 poly which provided good stability and range of motion without excess soft tissue tension.  The shoulder was trialed.  There was good ROM in all planes and the shoulder was stable with no inferior translation.   We then mobilized her tuberosities again and placed the anterior deep limbs of the 4 #5 fiber wires around the stem.  1 of these was tied down fixing the greater tuberosity in place after bone graft harvest from the humeral head component was placed underneath.   The joint was reduced and thoroughly irrigated with pulsatile lavage. The remaining sutures were then placed through the subscapularis and the bone  tendon junction and the tuberosities were reduced after bone graft placed beneath as autograft at the subscap.  We horizontally secured the tuberosities before placing vertical fixation with the suture that was placed into the shaft.  Tuberosities moved as a unit were happy with the overall reduction.    We irrigated copiously at this point.  Hemostasis was obtained. The deltopectoral interval was reapproximated with #1 Ethibond. The subcutaneous tissues were closed with 3-0 Vicryl and the skin was closed with running nylon due to history of cancer.     The wounds were cleaned and dried and an Aquacel dressing was placed. The drapes taken down. The arm was placed into sling  with abduction pillow. Patient was awakened, extubated, and transferred to the recovery room in stable condition. There were no intraoperative complications. The sponge, needle, and attention counts were correct at the end of the case.     Alfonse Alpers, PA-C, present and scrubbed throughout the case, critical for completion in a timely fashion, and for retraction, instrumentation, closure.

## 2022-07-18 NOTE — Anesthesia Postprocedure Evaluation (Signed)
Anesthesia Post Note  Patient: Sarah Flores  Procedure(s) Performed: REVERSE SHOULDER ARTHROPLASTY (Left: Shoulder) OPEN REDUCTION INTERNAL FIXATION (ORIF) PROXIMAL HUMERUS FRACTURE (Left: Shoulder)     Patient location during evaluation: PACU Anesthesia Type: General Level of consciousness: awake and alert Pain management: pain level controlled Vital Signs Assessment: post-procedure vital signs reviewed and stable Respiratory status: spontaneous breathing, nonlabored ventilation, respiratory function stable and patient connected to nasal cannula oxygen Cardiovascular status: blood pressure returned to baseline and stable Postop Assessment: no apparent nausea or vomiting Anesthetic complications: no   No notable events documented.  Last Vitals:  Vitals:   07/18/22 1015 07/18/22 1115  BP: (!) 133/59 (!) 136/54  Pulse: 76 77  Resp: 12 16  Temp:  (!) 36.3 C  SpO2: 94% 94%    Last Pain:  Vitals:   07/18/22 1115  TempSrc:   PainSc: 0-No pain                 Collene Schlichter

## 2022-07-18 NOTE — Anesthesia Preprocedure Evaluation (Addendum)
Anesthesia Evaluation  Patient identified by MRN, date of birth, ID band Patient awake    Reviewed: Allergy & Precautions, NPO status , Patient's Chart, lab work & pertinent test results, reviewed documented beta blocker date and time   Airway Mallampati: III  TM Distance: >3 FB Neck ROM: Full    Dental  (+) Teeth Intact, Dental Advisory Given   Pulmonary neg pulmonary ROS   Pulmonary exam normal breath sounds clear to auscultation       Cardiovascular hypertension, Pt. on home beta blockers and Pt. on medications + CAD, + Past MI and + DVT  Normal cardiovascular exam Rhythm:Regular Rate:Normal     Neuro/Psych CVA    GI/Hepatic Neg liver ROS,GERD  Medicated,,  Endo/Other  diabetes, Type 2, Oral Hypoglycemic Agents    Renal/GU Renal InsufficiencyRenal disease     Musculoskeletal  (+) Arthritis ,  Fibromyalgia -LEFT PROXIMAL HUMERUS FRACTURE   Abdominal   Peds  Hematology negative hematology ROS (+)   Anesthesia Other Findings Day of surgery medications reviewed with the patient.  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, s/p VF injection- recovered  Reproductive/Obstetrics                             Anesthesia Physical Anesthesia Plan  ASA: 3  Anesthesia Plan: General   Post-op Pain Management: Regional block* and Tylenol PO (pre-op)*   Induction: Intravenous  PONV Risk Score and Plan: 3 and Dexamethasone and Ondansetron  Airway Management Planned: Oral ETT  Additional Equipment:   Intra-op Plan:   Post-operative Plan: Extubation in OR  Informed Consent: I have reviewed the patients History and Physical, chart, labs and discussed the procedure including the risks, benefits and alternatives for the proposed anesthesia with the patient or authorized representative who has indicated his/her understanding and acceptance.     Dental advisory given  Plan Discussed  with: CRNA  Anesthesia Plan Comments:        Anesthesia Quick Evaluation

## 2022-07-18 NOTE — Progress Notes (Signed)
Assisted Dr. Turk with left, interscalene , ultrasound guided block. Side rails up, monitors on throughout procedure. See vital signs in flow sheet. Tolerated Procedure well. ?

## 2022-07-18 NOTE — Anesthesia Procedure Notes (Signed)
Procedure Name: Intubation Date/Time: 07/18/2022 8:22 AM  Performed by: Lauralyn Primes, CRNAPre-anesthesia Checklist: Patient identified, Emergency Drugs available, Suction available and Patient being monitored Patient Re-evaluated:Patient Re-evaluated prior to induction Oxygen Delivery Method: Circle system utilized Preoxygenation: Pre-oxygenation with 100% oxygen Induction Type: IV induction Ventilation: Mask ventilation without difficulty Laryngoscope Size: Mac and 3 Grade View: Grade I Tube type: Oral Tube size: 7.0 mm Number of attempts: 1 Airway Equipment and Method: Stylet and Bite block Placement Confirmation: ETT inserted through vocal cords under direct vision, positive ETCO2 and breath sounds checked- equal and bilateral Secured at: 22 cm Tube secured with: Tape Dental Injury: Teeth and Oropharynx as per pre-operative assessment

## 2022-07-18 NOTE — Interval H&P Note (Signed)
All questions answered, patient wants to proceed with procedure. ? ?

## 2022-07-18 NOTE — Anesthesia Procedure Notes (Signed)
Anesthesia Regional Block: Interscalene brachial plexus block   Pre-Anesthetic Checklist: , timeout performed,  Correct Patient, Correct Site, Correct Laterality,  Correct Procedure, Correct Position, site marked,  Risks and benefits discussed,  Surgical consent,  Pre-op evaluation,  At surgeon's request and post-op pain management  Laterality: Left  Prep: chloraprep       Needles:  Injection technique: Single-shot  Needle Type: Echogenic Stimulator Needle     Needle Length: 9cm  Needle Gauge: 22     Additional Needles:   Procedures:,,,, ultrasound used (permanent image in chart),,    Narrative:  Start time: 07/18/2022 7:38 AM End time: 07/18/2022 7:48 AM Injection made incrementally with aspirations every 5 mL.  Performed by: Personally  Anesthesiologist: Collene Schlichter, MD  Additional Notes: Functioning IV was confirmed and monitors were applied.  A 90mm 22ga Arrow echogenic stimulator needle was used. Sterile prep and drape, hand hygiene, and sterile gloves were used.  Negative aspiration and negative test dose prior to incremental administration of local anesthetic. The patient tolerated the procedure well.  Ultrasound guidance: relevent anatomy identified, needle position confirmed, local anesthetic spread visualized around nerve(s), vascular puncture avoided.  Image printed for medical record.

## 2022-07-19 ENCOUNTER — Encounter (HOSPITAL_BASED_OUTPATIENT_CLINIC_OR_DEPARTMENT_OTHER): Payer: Self-pay | Admitting: Orthopaedic Surgery

## 2022-08-15 ENCOUNTER — Other Ambulatory Visit: Payer: Self-pay | Admitting: Hematology and Oncology

## 2022-08-15 DIAGNOSIS — D0512 Intraductal carcinoma in situ of left breast: Secondary | ICD-10-CM

## 2022-10-10 ENCOUNTER — Ambulatory Visit (HOSPITAL_COMMUNITY)
Admission: RE | Admit: 2022-10-10 | Discharge: 2022-10-10 | Disposition: A | Discharge status: Home / Self Care | Payer: Medicare Other | Source: Ambulatory Visit | Attending: Orthopaedic Surgery | Admitting: Orthopaedic Surgery

## 2022-10-10 ENCOUNTER — Other Ambulatory Visit (HOSPITAL_COMMUNITY): Payer: Self-pay | Admitting: Orthopaedic Surgery

## 2022-10-10 ENCOUNTER — Ambulatory Visit: Payer: Medicare Other | Admitting: Allergy and Immunology

## 2022-10-10 DIAGNOSIS — S42192A Fracture of other part of scapula, left shoulder, initial encounter for closed fracture: Secondary | ICD-10-CM | POA: Diagnosis present

## 2022-10-18 ENCOUNTER — Other Ambulatory Visit: Payer: Self-pay | Admitting: Allergy and Immunology

## 2022-10-31 ENCOUNTER — Encounter: Payer: Self-pay | Admitting: Allergy and Immunology

## 2022-10-31 ENCOUNTER — Ambulatory Visit (INDEPENDENT_AMBULATORY_CARE_PROVIDER_SITE_OTHER): Payer: Medicare Other | Admitting: Allergy and Immunology

## 2022-10-31 VITALS — BP 120/58 | HR 60 | Resp 12

## 2022-10-31 DIAGNOSIS — L501 Idiopathic urticaria: Secondary | ICD-10-CM

## 2022-10-31 DIAGNOSIS — L299 Pruritus, unspecified: Secondary | ICD-10-CM

## 2022-10-31 DIAGNOSIS — J3089 Other allergic rhinitis: Secondary | ICD-10-CM

## 2022-10-31 DIAGNOSIS — J310 Chronic rhinitis: Secondary | ICD-10-CM

## 2022-10-31 DIAGNOSIS — T783XXD Angioneurotic edema, subsequent encounter: Secondary | ICD-10-CM

## 2022-10-31 MED ORDER — MONTELUKAST SODIUM 10 MG PO TABS: ORAL_TABLET | ORAL | 3 refills | Status: DC

## 2022-10-31 MED ORDER — FAMOTIDINE 20 MG PO TABS: 20.0000 mg | ORAL_TABLET | Freq: Every day | ORAL | 3 refills | Status: DC

## 2022-10-31 NOTE — Progress Notes (Signed)
[place] - [place] - [place] - [place]   Follow-up Note  Referring Provider: Rhea Bleacher* Primary Provider: Krystal Clark, NP Date of Office Visit: 10/31/2022  Subjective:   Sarah Flores (DOB: Apr 23, 1945) is a 77 y.o. female who [facility] in re-evaluation of the following:  HPI: Gillian presents to this clinic in evaluation of recurrent urticaria and angioedema and allergic rhinitis.  I last saw her in this clinic 25 March 2022.  She has excellent control of her recurrent urticaria and angioedema while using an H1 receptor blocker, H2 receptor blocker, and a leukotriene modifier on a consistent basis.  Currently she is only using 10 mg of cetirizine per day along with her famotidine and montelukast.  She has done very well regarding her upper airway issue while intermittently using nasal ipratropium for gustatory rhinitis.  When I last saw her in this clinic she was having a escalating bilirubin and we obtained some blood tests in investigation of that issue which identified a positive ANA with RNP elevation and she has subsequently seen a rheumatologist who is diagnosed her with rheumatoid arthritis and she is now using methotrexate for that condition.  She informs me that she has had a another fracture of her left arm.  She is scheduled to have a bone density scan performed this month in further evaluation of her preexistent osteopenia.  Allergies as of 10/31/2022       Reactions   Keflex [cephalexin] Anaphylaxis   Penicillins Anaphylaxis   Amlodipine Swelling   Benzoin Other (See Comments)   Burning - "looked like a 2nd degree burn"        Medication List    ALPRAZolam 0.5 MG tablet Commonly known as: XANAX Take one by mouth prn anxiety.  Refills after prescription expiration require follow-up evaluation at the office.   atorvastatin 10 MG tablet Commonly known as:  LIPITOR Take 1 tablet by mouth at bedtime.   cyanocobalamin 1000 MCG/ML injection Commonly known as: VITAMIN B12 Inject into the muscle.   doxazosin 1 MG tablet Commonly known as: CARDURA Take 1 mg by mouth at bedtime.   famotidine 20 MG tablet Commonly known as: PEPCID Take 1 tablet (20 mg total) by mouth daily.   folic acid 1 MG tablet Commonly known as: FOLVITE Take 1 tablet by mouth daily.   glipiZIDE 10 MG 24 hr tablet Commonly known as: GLUCOTROL XL Take 10 mg by mouth daily.   ipratropium 0.06 % nasal spray Commonly known as: ATROVENT Place 1-2 sprays into both nostrils every 6 (six) hours as needed for rhinitis.   losartan-hydrochlorothiazide 100-25 MG tablet Commonly known as: HYZAAR Take 1 tablet by mouth daily.   metFORMIN 500 MG 24 hr tablet Commonly known as: GLUCOPHAGE-XR Take 1,000 mg by mouth 2 (two) times a day.   methotrexate 2.5 MG tablet Commonly known as: RHEUMATREX Take by mouth.   metoprolol succinate 100 MG 24 hr tablet Commonly known as: TOPROL-XL TAKE 1 TABLET DAILY (TAKE WITH OR IMMEDIATELY FOLLOWING A MEAL) What changed: See the new instructions.   montelukast 10 MG tablet Commonly known as: SINGULAIR Take one tablet by mouth once daily as directed.   Nasacort Allergy 24HR 55 MCG/ACT Aero nasal inhaler Generic drug: triamcinolone Place 2 sprays into the nose daily as needed.   nitroGLYCERIN 0.4 MG SL tablet Commonly known as: NITROSTAT Place 1 tablet (0.4 mg total) under the tongue every 5 (five) minutes  as needed for chest pain.   tamoxifen 10 MG tablet Commonly known as: NOLVADEX TAKE 1 TABLET BY MOUTH EVERY DAY        Past Medical History:  Diagnosis Date   Anemia    Arthritis    Breast cancer (HCC)    Cardiogenic shock (HCC)    CKD (chronic kidney disease), stage II    Diabetes mellitus (HCC)    DVT (deep venous thrombosis) (HCC) 03/05/1991   "right arm; from IV I had when I had my hysterectomy; on Coumadin for  awhile" (02/18/2013)   Dyslipidemia, goal LDL below 70    Family history of breast cancer 06/06/2021   Family history of ovarian cancer 06/06/2021   Family history of prostate cancer 06/06/2021   Fibromyalgia    History of kidney stones    Hypertension    NASH (nonalcoholic steatohepatitis)    Dr. Eulah Pont in [place]   Palpitations    a. event monitor 07/2013 did not show significant arrhythmia - mostly NSR 70-100, with occasional S Tachy ~100-115, rare PACs & PVCs - not recorded as symptomatic.   ST elevation myocardial infarction (STEMI) of lateral wall (HCC)    a. Post-op STEMI 02/2013 with cardiogenic shock, cath revealing clean cors and LV dysfunction c/w Takasubo Syndrome.   Stroke [facility])    a. CT head at [facility] 03/2014: No acute intracranial abnormality, probable small old cortical infarct in L posterior parietal region.   Syncope    a. Reported prior near-syncope in 2015 with suggestion of atrial fibrillation but no documentation to support this. An event monitor 07/2013 did not show significant arrhythmia - mostly NSR 70-100, with occasional S Tachy ~100-115, rare PACs & PVCs - not recorded as symptomatic. b. Syncope 03/2014 felt vasovagal.   Takotsubo cardiomyopathy    Vocal cord paralysis 2011   After parathyroidectomy, s/p VF injection- recovered    Past Surgical History:  Procedure Laterality Date   ABDOMINAL HYSTERECTOMY  121992   APPENDECTOMY  02/1966   BACK SURGERY     BREAST LUMPECTOMY WITH RADIOACTIVE SEED LOCALIZATION Left 06/21/2021   Procedure: LEFT BREAST LUMPECTOMY WITH RADIOACTIVE SEED LOCALIZATION;  Surgeon: Griselda Miner, MD;  Location: [facility] OR;  Service: General;  Laterality: Left;   CARDIAC CATHETERIZATION  1990 X2   CARDIAC CATHETERIZATION  02/22/2013   Nonischemic; no notable CAD. EF 40%.   CHOLECYSTECTOMY  ~2001   COLONOSCOPY W/ BIOPSIES     LARYNGOSCOPY     laryngoscopy micro suspension with left Cymetra injection for dysphonia 09/29/09 [facility])   LEFT HEART  CATHETERIZATION WITH CORONARY ANGIOGRAM N/A 02/22/2013   Procedure: LEFT HEART CATHETERIZATION WITH CORONARY ANGIOGRAM;  Surgeon: Thurmon Fair, MD;  Location: [facility] CATH LAB;  Service: Cardiovascular; angiographically normal coronary arteries. LV dysfunction consistent with Takotsubo Cardiomyopathy/Syndrome    MAXIMUM ACCESS (MAS)POSTERIOR LUMBAR INTERBODY FUSION (PLIF) 1 LEVEL N/A 02/18/2013   Procedure: LUMBAR FIVE TO SACRAL ONE MAXIMUM ACCESS (MAS) POSTERIOR LUMBAR INTERBODY FUSION (PLIF) 1 LEVEL;  Surgeon: Tia Alert, MD;  Location: [facility] NEURO ORS;  Service: Neurosurgery;  Laterality: N/A;   ORIF HUMERUS FRACTURE Left 07/18/2022   Procedure: OPEN REDUCTION INTERNAL FIXATION (ORIF) PROXIMAL HUMERUS FRACTURE;  Surgeon: Bjorn Pippin, MD;  Location: [facility];  Service: Orthopedics;  Laterality: Left;   PARATHYROIDECTOMY  08/07/2009   [facility]   PLANTAR'S WART EXCISION Bilateral 1960's; 1980   REVERSE SHOULDER ARTHROPLASTY Left 07/18/2022   Procedure: REVERSE SHOULDER ARTHROPLASTY;  Surgeon: Bjorn Pippin, MD;  Location: [facility];  Service: Orthopedics;  Laterality: Left;   TONSILLECTOMY  1950's   TRANSTHORACIC ECHOCARDIOGRAM  02/18/2013   EF 35-30% with anterolateral, lateral and inferolateral/apical hypokinesis (Takotsubo)    TRANSTHORACIC ECHOCARDIOGRAM  02/25/2013   EF 55%. No clear regional wall motion abnormalities. Grade 3 diastolic dysfunction. Mild MR   TRANSTHORACIC ECHOCARDIOGRAM  December 2015; January 2016   a. Normal LV size and function. EF 55-60%. Gr1 DD;; b. Normal LV size function with EF 60-65%. No RWM/A no comment on valve lesions or abnormalities. No comment on diastolic function.   TUBAL LIGATION  1979    Review of systems negative except as noted in HPI / PMHx or noted below:  Review of Systems  Constitutional: Negative.   HENT: Negative.    Eyes: Negative.   Respiratory: Negative.    Cardiovascular: Negative.    Gastrointestinal: Negative.   Genitourinary: Negative.   Musculoskeletal: Negative.   Skin: Negative.   Neurological: Negative.   Endo/Heme/Allergies: Negative.   Psychiatric/Behavioral: Negative.       Objective:   Vitals:   10/31/22 0817 10/31/22 0843  BP: (!) 140/68 (!) 120/58  Pulse: 60   Resp: 12   SpO2: 97%           Physical Exam Constitutional:      Appearance: She is not diaphoretic.  HENT:     Head: Normocephalic.     Right Ear: Tympanic membrane, ear canal and external ear normal.     Left Ear: Tympanic membrane, ear canal and external ear normal.     Nose: Nose normal. No mucosal edema or rhinorrhea.     Mouth/Throat:     Pharynx: Uvula midline. No oropharyngeal exudate.  Eyes:     Conjunctiva/sclera: Conjunctivae normal.  Neck:     Thyroid: No thyromegaly.     Trachea: Trachea normal. No tracheal tenderness or tracheal deviation.  Cardiovascular:     Rate and Rhythm: Normal rate and regular rhythm.     Heart sounds: Normal heart sounds, S1 normal and S2 normal. No murmur heard. Pulmonary:     Effort: No respiratory distress.     Breath sounds: Normal breath sounds. No stridor. No wheezing or rales.  Musculoskeletal:     Comments: Left arm brace  Lymphadenopathy:     Head:     Right side of head: No tonsillar adenopathy.     Left side of head: No tonsillar adenopathy.     Cervical: No cervical adenopathy.  Skin:    Findings: No erythema or rash.     Nails: There is no clubbing.  Neurological:     Mental Status: She is alert.     Diagnostics:   Results of blood tests obtained 26 March 2022 identified positive ANA with ENA RNP 3.1U/mL, GGT 60U/L, antimitochondrial antibody less than 20, antimicrosomal LK M antibody 0.9 anti-smooth muscle antibody 18, ammonia 33 UG/DL, negative hepatitis A, B and C serology.  Assessment and Plan:   1. Idiopathic urticaria   2. Pruritic disorder   3. Angioedema, subsequent encounter   4. Other allergic  rhinitis   5. Gustatory rhinitis     1.  Continue to Treat and prevent inflammation:   A.  Famotidine 20 mg - 1 tablet 1 time per day  B.  Montelukast 10 mg - 1 tablet 1 time per day  C.  Cetirizine 10 mg - 1-2 tablets 1-2 times per day (max = 40 mg)  2.  If needed:  A. Ipratropium 0.06% - 1-2 sprays each nostril every 6 hours TO DRY NOSE  3. Plan for fall flu vaccine  4. Return to clinic in 12 months or earlier if problem  Joaly appears to be doing pretty well with the issues that we follow her for in this clinic and she will remain on a collection of agents directed at immune activation and she has the option of using ipratropium for her airway disease.  Assuming she does well with this plan I will see her back in this clinic in 1 year or earlier if there is a problem.  Laurette Schimke, MD Allergy / Immunology [facility]

## 2022-10-31 NOTE — Patient Instructions (Signed)
  1.  Continue to Treat and prevent inflammation:   A.  Famotidine 20 mg - 1 tablet 1 time per day  B.  Montelukast 10 mg - 1 tablet 1 time per day  C.  Cetirizine 10 mg - 1-2 tablets 1-2 times per day (max = 40 mg)  2.  If needed:   A. Ipratropium 0.06% - 1-2 sprays each nostril every 6 hours TO DRY NOSE  3. Plan for fall flu vaccine  4. Return to clinic in 12 months or earlier if problem

## 2022-11-05 ENCOUNTER — Encounter: Payer: Self-pay | Admitting: Allergy and Immunology

## 2022-11-11 ENCOUNTER — Other Ambulatory Visit: Payer: Self-pay | Admitting: *Deleted

## 2022-11-11 DIAGNOSIS — D0512 Intraductal carcinoma in situ of left breast: Secondary | ICD-10-CM

## 2022-11-11 MED ORDER — TAMOXIFEN CITRATE 10 MG PO TABS
10.0000 mg | ORAL_TABLET | Freq: Every day | ORAL | 3 refills | Status: DC
Start: 2022-11-11 — End: 2023-09-29

## 2023-03-06 ENCOUNTER — Other Ambulatory Visit: Payer: Self-pay | Admitting: *Deleted

## 2023-03-06 DIAGNOSIS — D0512 Intraductal carcinoma in situ of left breast: Secondary | ICD-10-CM

## 2023-03-06 NOTE — Progress Notes (Signed)
 RN successfully faxed mammogram orders to [facility].

## 2023-06-11 NOTE — Assessment & Plan Note (Signed)
 05/21/2021 :Screening mammogram detected left breast mass 0.7 cm by ultrasound, axilla negative, biopsy revealed low-grade to intermediate grade DCIS ER 100%, PR 100%    06/21/2021:Left lumpectomy: Intermediate grade DCIS 0.9 cm, margins negative, ER 100%, PR 100%    Treatment plan: 1.  Adjuvant radiation therapy 08/01/21-08/27/21 2. antiestrogen therapy with tamoxifen daily x5 years (based upon TAM-01 clinical trial we are using lower dosage of tamoxifen at 10 mg daily.  We can go down to 5 mg if needed.)   Tamoxifen toxicities: Tolerating it extremely well without any problems or concerns.   Elevated hemoglobin A1c: I discussed with her that she needs to get this down to decrease her coronary artery disease risk and stroke risk because of thromboembolic risk factor of tamoxifen.  She is trying to get Ozempic covered by her insurance.   Breast cancer surveillance: Breast exam 06/12/2023: Benign Mammograms 06/24/2022 [facility]: Benign, density category A   Return to clinic in 1 year for follow-up

## 2023-06-12 ENCOUNTER — Inpatient Hospital Stay: Attending: Hematology and Oncology | Admitting: Hematology and Oncology

## 2023-06-12 VITALS — BP 137/72 | HR 76 | Temp 97.8°F | Resp 18 | Wt 135.2 lb

## 2023-06-12 DIAGNOSIS — E119 Type 2 diabetes mellitus without complications: Secondary | ICD-10-CM | POA: Insufficient documentation

## 2023-06-12 DIAGNOSIS — Z1721 Progesterone receptor positive status: Secondary | ICD-10-CM | POA: Diagnosis not present

## 2023-06-12 DIAGNOSIS — D0512 Intraductal carcinoma in situ of left breast: Secondary | ICD-10-CM | POA: Insufficient documentation

## 2023-06-12 DIAGNOSIS — Z170 Estrogen receptor positive status [ER+]: Secondary | ICD-10-CM | POA: Diagnosis not present

## 2023-06-12 DIAGNOSIS — Z79810 Long term (current) use of selective estrogen receptor modulators (SERMs): Secondary | ICD-10-CM | POA: Insufficient documentation

## 2023-06-12 MED ORDER — LEFLUNOMIDE 20 MG PO TABS: 20.0000 mg | ORAL_TABLET | Freq: Every day | ORAL | Status: AC

## 2023-06-12 NOTE — Progress Notes (Signed)
 Patient Care Team: Krystal Clark, NP as PCP - General (Internal Medicine) Marykay Lex, MD as PCP - Cardiology (Cardiology) Griselda Miner, MD as Consulting Physician (General Surgery) Serena Croissant, MD as Consulting Physician (Hematology and Oncology) Dorothy Puffer, MD as Consulting Physician (Radiation Oncology)  DIAGNOSIS:  Encounter Diagnosis  Name Primary?   Ductal carcinoma in situ (DCIS) of left breast Yes    SUMMARY OF ONCOLOGIC HISTORY: Oncology History  Ductal carcinoma in situ (DCIS) of left breast  06/04/2021 Initial Diagnosis   Screening mammogram detected left breast mass 0.7 cm by ultrasound, axilla negative, biopsy revealed low-grade to intermediate grade DCIS ER 100%, PR 100%   06/06/2021 Cancer Staging   Staging form: Breast, AJCC 8th Edition - Clinical: Stage 0 (cTis (DCIS), cN0, cM0, ER+, PR+, HER2: Not Assessed) - Signed by Loa Socks, NP on 11/29/2021 Stage prefix: Initial diagnosis Nuclear grade: G2   06/16/2021 Genetic Testing   Negative hereditary cancer genetic testing: no pathogenic variants detected in [facility] BRCAPlus Panel or CancerNext-Expanded +RNAinsight Panel.  Report dates are 06/16/2021 and 06/18/2021.   The BRCAplus panel offered by [employer] and includes sequencing and deletion/duplication analysis for the following 8 genes: ATM, BRCA1, BRCA2, CDH1, CHEK2, PALB2, PTEN, and TP53.  The CancerNext-Expanded gene panel offered by [facility] and includes sequencing, rearrangement, and RNA analysis for the following 77 genes: AIP, ALK, APC, ATM, AXIN2, BAP1, BARD1, BLM, BMPR1A, BRCA1, BRCA2, BRIP1, CDC73, CDH1, CDK4, CDKN1B, CDKN2A, CHEK2, CTNNA1, DICER1, FANCC, FH, FLCN, GALNT12, KIF1B, LZTR1, MAX, MEN1, MET, MLH1, MSH2, MSH3, MSH6, MUTYH, NBN, NF1, NF2, NTHL1, PALB2, PHOX2B, PMS2, POT1, PRKAR1A, PTCH1, PTEN, RAD51C, RAD51D, RB1, RECQL, RET, SDHA, SDHAF2, SDHB, SDHC, SDHD, SMAD4, SMARCA4, SMARCB1, SMARCE1, STK11, SUFU,  TMEM127, TP53, TSC1, TSC2, VHL and XRCC2 (sequencing and deletion/duplication); EGFR, EGLN1, HOXB13, KIT, MITF, PDGFRA, POLD1, and POLE (sequencing only); EPCAM and GREM1 (deletion/duplication only).    06/21/2021 Surgery   Left lumpectomy: Intermediate grade DCIS 0.9 cm, margins negative, ER 100%, PR 100%   07/31/2021 - 08/27/2021 Radiation Therapy   Site Technique Total Dose (Gy) Dose per Fx (Gy) Completed Fx Beam Energies  Breast, Left: Breast_L 3D 42.56/42.56 2.66 16/16 6XFFF  Breast, Left: Breast_L_Bst 3D 10/10 2.5 4/4 6X, 10X     09/2021 -  Anti-estrogen oral therapy   Tamoxifen x 5 years     CHIEF COMPLIANT: Follow-up on tamoxifen therapy  HISTORY OF PRESENT ILLNESS:   History of Present Illness The patient, with a history of rheumatoid arthritis, fibromyalgia, diabetes, and breast cancer, presents for a follow-up appointment two years post-surgery for DCIS. She reports that her joint symptoms have improved since switching from methotrexate to leflunomide for rheumatoid arthritis, although she still experiences occasional discomfort in one hand. She also notes that her hair was falling out while on methotrexate. Her blood sugar levels have been well-controlled since starting on Ozempic for diabetes, and she has stopped taking glipizide unless she plans to eat a large meal. She reports no significant side effects from tamoxifen, which she is presumably taking for a history of breast cancer. She has experienced multiple falls in the past year, resulting in a broken shoulder and arm, and subsequent shoulder replacement. She also reports some pain in her left breast, which she attributes to bruising from her falls.     ALLERGIES:  is allergic to keflex [cephalexin], penicillins, amlodipine, and benzoin.  MEDICATIONS:  Current Outpatient Medications  Medication Sig Dispense Refill   ALPRAZolam (XANAX) 0.5 MG  tablet Take one by mouth prn anxiety.  Refills after prescription expiration  require follow-up evaluation at the office.     atorvastatin (LIPITOR) 10 MG tablet Take 1 tablet by mouth at bedtime.     cyanocobalamin (VITAMIN B12) 1000 MCG/ML injection Inject into the muscle.     doxazosin (CARDURA) 1 MG tablet Take 1 mg by mouth at bedtime.     famotidine (PEPCID) 20 MG tablet Take 1 tablet (20 mg total) by mouth daily. 90 tablet 3   folic acid (FOLVITE) 1 MG tablet Take 1 tablet by mouth daily.     glipiZIDE (GLUCOTROL XL) 10 MG 24 hr tablet Take 10 mg by mouth daily.     ipratropium (ATROVENT) 0.06 % nasal spray Place 1-2 sprays into both nostrils every 6 (six) hours as needed for rhinitis.     leflunomide (ARAVA) 10 MG tablet 10 mg.     leflunomide (ARAVA) 20 MG tablet Take 1 tablet (20 mg total) by mouth daily.     losartan-hydrochlorothiazide (HYZAAR) 100-25 MG tablet Take 1 tablet by mouth daily.     metFORMIN (GLUCOPHAGE-XR) 500 MG 24 hr tablet Take 1,000 mg by mouth 2 (two) times a day.     methotrexate (RHEUMATREX) 2.5 MG tablet Take by mouth.     metoprolol succinate (TOPROL-XL) 100 MG 24 hr tablet TAKE 1 TABLET DAILY (TAKE WITH OR IMMEDIATELY FOLLOWING A MEAL) (Patient taking differently: Take 50-100 mg by mouth See admin instructions. 50mg  in the morning and 100mg  in the evening) 90 tablet 2   montelukast (SINGULAIR) 10 MG tablet Take one tablet by mouth once daily as directed. 90 tablet 3   nitroGLYCERIN (NITROSTAT) 0.4 MG SL tablet Place 1 tablet (0.4 mg total) under the tongue every 5 (five) minutes as needed for chest pain. 25 tablet 6   Semaglutide,0.25 or 0.5MG /DOS, (OZEMPIC, 0.25 OR 0.5 MG/DOSE,) 2 MG/3ML SOPN Inject 0.25mg  once a week x 4 weeks, if tolerating okay advance to 0.5mg  once a week     tamoxifen (NOLVADEX) 10 MG tablet Take 1 tablet (10 mg total) by mouth daily. 90 tablet 3   triamcinolone (NASACORT ALLERGY 24HR) 55 MCG/ACT AERO nasal inhaler Place 2 sprays into the nose daily as needed.     No current facility-administered medications for  this visit.    PHYSICAL EXAMINATION: ECOG PERFORMANCE STATUS: 1 - Symptomatic but completely ambulatory  There were no vitals filed for this visit. There were no vitals filed for this visit.  Physical Exam No palpable lumps or nodules in bilateral breasts or axilla.  Left breast scar tissue noted.  (exam performed in the presence of a chaperone)  LABORATORY DATA:  I have reviewed the data as listed    Latest Ref Rng & Units 07/16/2022   12:57 PM 06/06/2021   12:14 PM 03/14/2020   12:44 PM  CMP  Glucose 70 - 99 mg/dL 119  147  829   BUN 8 - 23 mg/dL 15  9  7    Creatinine 0.44 - 1.00 mg/dL 5.62  1.30  8.65   Sodium 135 - 145 mmol/L 133  138  136   Potassium 3.5 - 5.1 mmol/L 4.2  3.8  4.0   Chloride 98 - 111 mmol/L 101  99  98   CO2 22 - 32 mmol/L 20  28  25    Calcium 8.9 - 10.3 mg/dL 7.9  7.4  7.6   Total Protein 6.5 - 8.1 g/dL  7.2    Total  Bilirubin 0.3 - 1.2 mg/dL  1.6    Alkaline Phos 38 - 126 U/L  49    AST 15 - 41 U/L  20    ALT 0 - 44 U/L  22      Lab Results  Component Value Date   WBC 11.1 (H) 06/06/2021   HGB 12.9 06/06/2021   HCT 37.5 06/06/2021   MCV 95.9 06/06/2021   PLT 301 06/06/2021   NEUTROABS 5.8 06/06/2021    ASSESSMENT & PLAN:  Ductal carcinoma in situ (DCIS) of left breast 05/21/2021 :Screening mammogram detected left breast mass 0.7 cm by ultrasound, axilla negative, biopsy revealed low-grade to intermediate grade DCIS ER 100%, PR 100%    06/21/2021:Left lumpectomy: Intermediate grade DCIS 0.9 cm, margins negative, ER 100%, PR 100%    Treatment plan: 1.  Adjuvant radiation therapy 08/01/21-08/27/21 2. antiestrogen therapy with tamoxifen daily x5 years (based upon TAM-01 clinical trial we are using lower dosage of tamoxifen at 10 mg daily.  We can go down to 5 mg if needed.)   Tamoxifen toxicities: Tolerating it extremely well without any problems or concerns.   Diabetes mellitus: Much improved on Ozempic. Shoulder injury May 2024: Status post  surgery.  Result of falls.    Breast cancer surveillance: Breast exam 06/12/2023: Benign Mammograms 06/24/2022 [facility]: Benign, density category A   Return to clinic in 1 year for follow-up      No orders of the defined types were placed in this encounter.  The patient has a good understanding of the overall plan. she agrees with it. she will call with any problems that may develop before the next visit here. Total time spent: 30 mins including face to face time and time spent for planning, charting and co-ordination of care   Tamsen Meek, MD 06/12/23

## 2023-07-03 ENCOUNTER — Encounter: Payer: Self-pay | Admitting: Hematology and Oncology

## 2023-09-29 ENCOUNTER — Other Ambulatory Visit: Payer: Self-pay

## 2023-09-29 DIAGNOSIS — D0512 Intraductal carcinoma in situ of left breast: Secondary | ICD-10-CM

## 2023-09-29 MED ORDER — TAMOXIFEN CITRATE 10 MG PO TABS
10.0000 mg | ORAL_TABLET | Freq: Every day | ORAL | 3 refills | Status: AC
Start: 2023-09-29 — End: ?

## 2023-10-14 ENCOUNTER — Telehealth: Payer: Self-pay | Admitting: Allergy and Immunology

## 2023-10-14 MED ORDER — MONTELUKAST SODIUM 10 MG PO TABS: ORAL_TABLET | ORAL | 0 refills | Status: AC

## 2023-10-14 MED ORDER — FAMOTIDINE 20 MG PO TABS: 20.0000 mg | ORAL_TABLET | Freq: Every day | ORAL | 0 refills | Status: AC

## 2023-10-14 NOTE — Telephone Encounter (Signed)
 Medications have been sent to requested pharmacy.

## 2023-10-14 NOTE — Telephone Encounter (Signed)
 Patient is requesting a refill on Montelukast  and Famotidine  sent to [facility] by Mail.

## 2023-10-30 ENCOUNTER — Ambulatory Visit: Payer: Medicare Other | Admitting: Allergy and Immunology

## 2023-11-06 ENCOUNTER — Encounter: Payer: Self-pay | Admitting: Allergy and Immunology

## 2023-11-06 ENCOUNTER — Ambulatory Visit (INDEPENDENT_AMBULATORY_CARE_PROVIDER_SITE_OTHER): Admitting: Allergy and Immunology

## 2023-11-06 VITALS — BP 118/76 | HR 85 | Resp 16 | Ht 61.4 in | Wt 127.2 lb

## 2023-11-06 DIAGNOSIS — J3089 Other allergic rhinitis: Secondary | ICD-10-CM

## 2023-11-06 DIAGNOSIS — L501 Idiopathic urticaria: Secondary | ICD-10-CM

## 2023-11-06 DIAGNOSIS — J310 Chronic rhinitis: Secondary | ICD-10-CM

## 2023-11-06 DIAGNOSIS — T783XXD Angioneurotic edema, subsequent encounter: Secondary | ICD-10-CM

## 2023-11-06 NOTE — Patient Instructions (Addendum)
  1.  Continue to Treat and prevent inflammation:   A.  Famotidine  20 mg - 1 tablet 1 time per day  B.  Montelukast  10 mg - 1 tablet 1 time per day  C.  Cetirizine 10 mg - 1-2 tablets 1-2 times per day (max = 40 mg)  D.  START OTC Nasacort - 1 spray each nostril 1 time per day  2.  If needed:   A. Ipratropium 0.06% - 1-2 sprays each nostril every 6 hours TO DRY NOSE  3. Plan for fall flu vaccine; Influenza = Tamiflu. Covid = Paxlovid   4. Return to clinic in 12 months or earlier if problem

## 2023-11-06 NOTE — Progress Notes (Signed)
 [place] - [place] - [place] - [place] - [place]   Follow-up Note  Referring Provider: Benson Eleanor Flores* Primary Provider: Benson Eleanor Rung, NP Date of Office Visit: 11/06/2023  Subjective:   Sarah Flores (DOB: 24-Apr-1945) is a 78 y.o. female who [facility] in re-evaluation of the following:  HPI: [name] presents to this clinic in reevaluation of recurrent urticaria/angioedema and a history of allergic and nonallergic rhinitis.  I last saw her in this clinic 31 October 2022.  She has not had any recurrent urticaria or angioedematous episodes while consistently using a leukotriene modifier and an H1 and H2 receptor blocker.  She has been having some problems with sneezing.  Apparently she goes through sneezing spells with about 10 sneezes in a row and it does appear as though this occurs after eating.  She has had a few other things develop since have last seen her in this clinic.  She is having some problems with her right upper arm after sustaining a fracture and she has some limitation in movement and she is thinking about getting a second opinion regarding possible surgical approach to this issue.  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  She is now using leflunomide  to replace methotrexate for her rheumatoid arthritis.  Allergies as of 11/06/2023       Reactions   Keflex  [cephalexin ] Anaphylaxis   Penicillins Anaphylaxis   Amlodipine Swelling   Benzoin Other (See Comments)   Burning - looked like a 2nd degree burn        Medication List    ALPRAZolam  0.5 MG tablet Commonly known as: XANAX  Take one by mouth prn anxiety.  Refills after prescription expiration require follow-up evaluation at the office.   atorvastatin  10 MG tablet Commonly known as: LIPITOR Take 1 tablet by mouth at bedtime.   cyanocobalamin 1000 MCG/ML injection Commonly known as: VITAMIN  B12 Inject into the muscle.   doxazosin 1 MG tablet Commonly known as: CARDURA Take 1 mg by mouth at bedtime.   famotidine  20 MG tablet Commonly known as: PEPCID  Take 1 tablet (20 mg total) by mouth daily.   folic acid 1 MG tablet Commonly known as: FOLVITE Take 1 tablet by mouth daily.   glipiZIDE  10 MG 24 hr tablet Commonly known as: GLUCOTROL  XL Take 10 mg by mouth daily.   ipratropium 0.06 % nasal spray Commonly known as: ATROVENT  Place 1-2 sprays into both nostrils every 6 (six) hours as needed for rhinitis.   leflunomide  10 MG tablet Commonly known as: ARAVA  10 mg.   leflunomide  20 MG tablet Commonly known as: Arava  Take 1 tablet (20 mg total) by mouth daily.   losartan -hydrochlorothiazide  100-25 MG tablet Commonly known as: HYZAAR Take 1 tablet by mouth daily.   metFORMIN  500 MG 24 hr tablet Commonly known as: GLUCOPHAGE -XR Take 1,000 mg by mouth 2 (two) times a day.   methotrexate 2.5 MG tablet Commonly known as: RHEUMATREX Take by mouth.   metoprolol  succinate 100 MG 24 hr tablet Commonly known as: TOPROL -XL TAKE 1 TABLET DAILY (TAKE WITH OR IMMEDIATELY FOLLOWING A MEAL) What changed: See the new instructions.   montelukast  10 MG tablet Commonly known as: SINGULAIR  Take one tablet by mouth once daily as directed.   nitroGLYCERIN  0.4 MG SL tablet Commonly known as: NITROSTAT  Place 1 tablet (0.4 mg total) under the tongue every 5 (five) minutes as needed for chest pain.  Ozempic (0.25 or 0.5 MG/DOSE) 2 MG/3ML Sopn Generic drug: Semaglutide(0.25 or 0.5MG /DOS) Inject 0.25mg  once a week x 4 weeks, if tolerating okay advance to 0.5mg  once a week   tamoxifen  10 MG tablet Commonly known as: NOLVADEX  Take 1 tablet (10 mg total) by mouth daily.    Past Medical History:  Diagnosis Date   Anemia    Arthritis    Breast cancer (HCC)    Cardiogenic shock (HCC)    CKD (chronic kidney disease), stage II    Diabetes mellitus (HCC)    DVT (deep venous  thrombosis) (HCC) 03/05/1991   right arm; from IV I had when I had my hysterectomy; on Coumadin for awhile (02/18/2013)   Dyslipidemia, goal LDL below 70    Family history of breast cancer 06/06/2021   Family history of ovarian cancer 06/06/2021   Family history of prostate cancer 06/06/2021   Fibromyalgia    History of kidney stones    Hypertension    NASH (nonalcoholic steatohepatitis)    Dr. Beverley in [place]   Palpitations    a. event monitor 07/2013 did not show significant arrhythmia - mostly NSR 70-100, with occasional S Tachy ~100-115, rare PACs & PVCs - not recorded as symptomatic.   Rheumatoid arteritis (HCC)    ST elevation myocardial infarction (STEMI) of lateral wall (HCC)    a. Post-op STEMI 02/2013 with cardiogenic shock, cath revealing clean cors and LV dysfunction c/w Takasubo Syndrome.   Stroke [facility])    a. CT head at [facility] 03/2014: No acute intracranial abnormality, probable small old cortical infarct in L posterior parietal region.   Syncope    a. Reported prior near-syncope in 2015 with suggestion of atrial fibrillation but no documentation to support this. An event monitor 07/2013 did not show significant arrhythmia - mostly NSR 70-100, with occasional S Tachy ~100-115, rare PACs & PVCs - not recorded as symptomatic. b. Syncope 03/2014 felt vasovagal.   Takotsubo cardiomyopathy    Vocal cord paralysis 2011   After parathyroidectomy, s/p VF injection- recovered    Past Surgical History:  Procedure Laterality Date   ABDOMINAL HYSTERECTOMY  121992   APPENDECTOMY  02/1966   BACK SURGERY     BREAST LUMPECTOMY WITH RADIOACTIVE SEED LOCALIZATION Left 06/21/2021   Procedure: LEFT BREAST LUMPECTOMY WITH RADIOACTIVE SEED LOCALIZATION;  Surgeon: Curvin Deward MOULD, MD;  Location: [facility] OR;  Service: General;  Laterality: Left;   CARDIAC CATHETERIZATION  1990 X2   CARDIAC CATHETERIZATION  02/22/2013   Nonischemic; no notable CAD. EF 40%.   CHOLECYSTECTOMY  ~2001   COLONOSCOPY W/  BIOPSIES     LARYNGOSCOPY     laryngoscopy micro suspension with left Cymetra injection for dysphonia 09/29/09 [facility])   LEFT HEART CATHETERIZATION WITH CORONARY ANGIOGRAM N/A 02/22/2013   Procedure: LEFT HEART CATHETERIZATION WITH CORONARY ANGIOGRAM;  Surgeon: Jerel Balding, MD;  Location: [facility] CATH LAB;  Service: Cardiovascular; angiographically normal coronary arteries. LV dysfunction consistent with Takotsubo Cardiomyopathy/Syndrome    MAXIMUM ACCESS (MAS)POSTERIOR LUMBAR INTERBODY FUSION (PLIF) 1 LEVEL N/A 02/18/2013   Procedure: LUMBAR FIVE TO SACRAL ONE MAXIMUM ACCESS (MAS) POSTERIOR LUMBAR INTERBODY FUSION (PLIF) 1 LEVEL;  Surgeon: Alm GORMAN Molt, MD;  Location: [facility] NEURO ORS;  Service: Neurosurgery;  Laterality: N/A;   ORIF HUMERUS FRACTURE Left 07/18/2022   Procedure: OPEN REDUCTION INTERNAL FIXATION (ORIF) PROXIMAL HUMERUS FRACTURE;  Surgeon: Cristy Bonner DASEN, MD;  Location: [facility];  Service: Orthopedics;  Laterality: Left;   PARATHYROIDECTOMY  08/07/2009   [facility]   PLANTAR'S WART EXCISION Bilateral 1960's; 1980   REVERSE SHOULDER ARTHROPLASTY Left 07/18/2022   Procedure: REVERSE SHOULDER ARTHROPLASTY;  Surgeon: Cristy Bonner DASEN, MD;  Location: [facility];  Service: Orthopedics;  Laterality: Left;   TONSILLECTOMY  1950's   TRANSTHORACIC ECHOCARDIOGRAM  02/18/2013   EF 35-30% with anterolateral, lateral and inferolateral/apical hypokinesis (Takotsubo)    TRANSTHORACIC ECHOCARDIOGRAM  02/25/2013   EF 55%. No clear regional wall motion abnormalities. Grade 3 diastolic dysfunction. Mild MR   TRANSTHORACIC ECHOCARDIOGRAM  December 2015; January 2016   a. Normal LV size and function. EF 55-60%. Gr1 DD;; b. Normal LV size function with EF 60-65%. No RWM/A no comment on valve lesions or abnormalities. No comment on diastolic function.   TUBAL LIGATION  1979    Review of systems negative except as noted in HPI / PMHx or noted below:  Review of Systems   Constitutional: Negative.   HENT: Negative.    Eyes: Negative.   Respiratory: Negative.    Cardiovascular: Negative.   Gastrointestinal: Negative.   Genitourinary: Negative.   Musculoskeletal: Negative.   Skin: Negative.   Neurological: Negative.   Endo/Heme/Allergies: Negative.   Psychiatric/Behavioral: Negative.       Objective:   Vitals:   11/06/23 0822  BP: 118/76  Pulse: 85  Resp: 16  SpO2: 96%   Height: 5' 1.4 (156 cm)  Weight: 127 lb 3.2 oz (57.7 kg)   Physical Exam Constitutional:      Appearance: She is not diaphoretic.  HENT:     Head: Normocephalic.     Right Ear: Tympanic membrane, ear canal and external ear normal.     Left Ear: Tympanic membrane, ear canal and external ear normal.     Nose: Nose normal. No mucosal edema or rhinorrhea.     Mouth/Throat:     Pharynx: Uvula midline. No oropharyngeal exudate.  Eyes:     Conjunctiva/sclera: Conjunctivae normal.  Neck:     Thyroid : No thyromegaly.     Trachea: Trachea normal. No tracheal tenderness or tracheal deviation.  Cardiovascular:     Rate and Rhythm: Normal rate and regular rhythm.     Heart sounds: Normal heart sounds, S1 normal and S2 normal. No murmur heard. Pulmonary:     Effort: No respiratory distress.     Breath sounds: Normal breath sounds. No stridor. No wheezing or rales.  Lymphadenopathy:     Head:     Right side of head: No tonsillar adenopathy.     Left side of head: No tonsillar adenopathy.     Cervical: No cervical adenopathy.  Skin:    Findings: No erythema or rash.     Nails: There is no clubbing.  Neurological:     Mental Status: She is alert.     Diagnostics: none  Assessment and Plan:   1. Idiopathic urticaria   2. Angioedema, subsequent encounter   3. Other allergic rhinitis   4. Gustatory rhinitis    1.  Continue to Treat and prevent inflammation:   A.  Famotidine  20 mg - 1 tablet 1 time per day  B.  Montelukast  10 mg - 1 tablet 1 time per day  C.   Cetirizine 10 mg - 1-2 tablets 1-2 times per day (max = 40 mg)  D.  START OTC Nasacort - 1 spray each nostril 1 time per day  2.  If needed:   A. Ipratropium 0.06% - 1-2 sprays each nostril every 6 hours TO DRY  NOSE  3. Plan for fall flu vaccine; Influenza = Tamiflu. Covid = Paxlovid   4. Return to clinic in 12 months or earlier if problem  Overall Jamecia is doing well using an H1 and H2 receptor blocker and leukotriene modifier to address her immunological hyperreactivity with recurrent episodes of urticaria and angioedema and she will continue on these agents.  She does have some rhinitis and we will start her on a low-dose of nasal steroid and she can continue to use her nasal ipratropium for her gustatory rhinitis.  Assuming she does well we will see her back in this clinic in 1 year or earlier if there is a problem.  Camellia Denis, MD Allergy  / Immunology [facility]

## 2023-11-10 ENCOUNTER — Encounter: Payer: Self-pay | Admitting: Allergy and Immunology

## 2023-11-12 ENCOUNTER — Other Ambulatory Visit: Payer: Self-pay | Admitting: Orthopaedic Surgery

## 2023-11-12 DIAGNOSIS — M25512 Pain in left shoulder: Secondary | ICD-10-CM

## 2023-11-17 ENCOUNTER — Ambulatory Visit
Admission: RE | Admit: 2023-11-17 | Discharge: 2023-11-17 | Disposition: A | Discharge status: Home / Self Care | Source: Ambulatory Visit | Attending: Orthopaedic Surgery

## 2023-11-17 DIAGNOSIS — M25512 Pain in left shoulder: Secondary | ICD-10-CM

## 2023-12-08 ENCOUNTER — Telehealth: Payer: Self-pay | Admitting: *Deleted

## 2023-12-08 ENCOUNTER — Other Ambulatory Visit: Payer: Self-pay | Admitting: *Deleted

## 2023-12-08 DIAGNOSIS — D0512 Intraductal carcinoma in situ of left breast: Secondary | ICD-10-CM

## 2023-12-08 NOTE — Telephone Encounter (Signed)
 Late entry- Sarah Flores called earlier today. She found a lump or thickened area between her left breast and sternum while in the shower in the last month. It is not red, just a little tender if pressed. She said she wasn't sure if she should  Dr. Odean informed and received verbal order for US  L breast and diagnostic mammogram left breast.  Once test results are available, he will advise if appointment needed. Contacted Sarah Flores to inform of tests requested by Dr. Odean. She stated she has her mammograms and US  done at [facility] and verbalized understanding of information.

## 2023-12-09 ENCOUNTER — Other Ambulatory Visit: Payer: Self-pay | Admitting: *Deleted

## 2023-12-09 NOTE — Progress Notes (Signed)
 RN faxed orders to [facility] for Dx mammogram and US  of left breast.

## 2023-12-11 ENCOUNTER — Encounter: Payer: Self-pay | Admitting: Hematology and Oncology

## 2024-03-24 ENCOUNTER — Telehealth: Payer: Self-pay

## 2024-03-24 NOTE — Telephone Encounter (Signed)
 Pt called w/ concern about whether or not home health could use her Left arm for IV infusions, she's receiving IV mag via home health. She was recently admitted to the hospital where she had frequent lab work and they are having difficulty accessing the veins in her right arm as a result. She had a lumpectomy in her left breast in 2023 w/ no lymph node removal. Per MD RN advised that she was okay to proceed with IV in left arm. Pt also reports that during hospital admission w/ [facility] her last iron result was low and that she was advised by hospital provider to seek outpatient consult w/ hematology for anemia management. Pt had labs done today w/ iron. Pt scheduled w/ Dr. Gudena for tomorrow at 11 as a new hematology pt. Pt advised to have provider place referral order to [facility] MD. Pt verbalized understanding.

## 2024-03-24 NOTE — Progress Notes (Signed)
" ° ° ° °  Patient ID: Sarah Flores is a 79 y.o. female.   HPI Sarah Flores is being seen today by [facility]) for Virtual Visit F/U  Patient Denies Shortness of breath , Chest Pain, Abd Pain, or N/V/D.   Vital Signs:  03/24/2024 Vitals:   03/24/24 1226  BP: 122/70  Pulse: 64  Resp: 16  Temp: 98.2 F (36.8 C)  SpO2: 96%      Lung sounds were assessed and chest clear, no wheezing, rales, normal symmetric air entry.  MEDICATIONS: Was a full MedRec completed: No Medications were reviewed: Yes All Medications were accounted for: Yes  Visit Summary: Patient reports she is feeling well this morning, had a good nights rest. Patient has no current complaints other than still feeling a little weak but she reports this seems to be improving. Lung sounds are clear throughout, no peripheral edema noted. Patient reports her appetite is still minimal but she has been staying hydrated. Telehealth visit with provider complete, labs obtained per order. Peripheral IV assessed, Magnesium  1g infusion administered per order and without complications. Medications were reviewed and all questions and concerns were addressed. [facility] visit complete.   TASK PERFORMED/ MED'S GIVEN: Labs Drawn BMP, Mag and Medications Given Magnesium  1g infusion      OTHER: Interpreter was used today:No Pt is being Monitored by [facility] RPM: No  03/24/2024  12:44 PM Delon LITTIE Castle, EMT-P     "

## 2024-03-25 ENCOUNTER — Encounter: Payer: Self-pay | Admitting: Hematology and Oncology

## 2024-03-25 ENCOUNTER — Telehealth: Payer: Self-pay

## 2024-03-25 ENCOUNTER — Inpatient Hospital Stay: Attending: Hematology and Oncology | Admitting: Hematology and Oncology

## 2024-03-25 ENCOUNTER — Other Ambulatory Visit (HOSPITAL_COMMUNITY): Payer: Self-pay | Admitting: Hematology and Oncology

## 2024-03-25 VITALS — BP 109/59 | HR 94 | Temp 98.1°F | Resp 12 | Ht 61.5 in | Wt 112.6 lb

## 2024-03-25 DIAGNOSIS — D0512 Intraductal carcinoma in situ of left breast: Secondary | ICD-10-CM

## 2024-03-25 DIAGNOSIS — D509 Iron deficiency anemia, unspecified: Secondary | ICD-10-CM | POA: Diagnosis not present

## 2024-03-25 DIAGNOSIS — D649 Anemia, unspecified: Secondary | ICD-10-CM | POA: Insufficient documentation

## 2024-03-25 NOTE — Addendum Note (Signed)
 Addended by: DAYNE SHERRY RAMAN on: 03/25/2024 12:48 PM   Modules accepted: Orders

## 2024-03-25 NOTE — Progress Notes (Signed)
 Venofer not preferred. Feraheme preferred by insurance. Per Dr. Odean, okay to change to Feraheme 510mg  IV x 2  Masiyah Jorstad, PharmD, MPH, BCPS, CPP Clinical Pharmacist

## 2024-03-25 NOTE — Assessment & Plan Note (Signed)
 Lab review: 06/27/2023: Hemoglobin 12.6, MCV 97.1, ferritin 82 03/05/2024: Hemoglobin 11.7 03/17/2024: Hemoglobin 9.8, MCV 92.5, WBC 4.7, platelets 364 (patient was in the hospital with left lower lobe pneumonia)  Discussion: I discussed with the patient that when there is underlying infection it is quite common for patients to develop anemia.  I would recommend watchful monitoring and repeating blood work in a month

## 2024-03-25 NOTE — Progress Notes (Signed)
 "  Patient Care Team: Benson Eleanor Rung, NP as PCP - General (Internal Medicine) Anner Alm ORN, MD as PCP - Cardiology (Cardiology) Curvin Deward MOULD, MD as Consulting Physician (General Surgery) Odean Potts, MD as Consulting Physician (Hematology and Oncology) Dewey Rush, MD as Consulting Physician (Radiation Oncology)  DIAGNOSIS:  Encounter Diagnoses  Name Primary?   Ductal carcinoma in situ (DCIS) of left breast Yes   Normocytic anemia    Iron deficiency anemia, unspecified iron deficiency anemia type     SUMMARY OF ONCOLOGIC HISTORY: Oncology History  Ductal carcinoma in situ (DCIS) of left breast  06/04/2021 Initial Diagnosis   Screening mammogram detected left breast mass 0.7 cm by ultrasound, axilla negative, biopsy revealed low-grade to intermediate grade DCIS ER 100%, PR 100%   06/06/2021 Cancer Staging   Staging form: Breast, AJCC 8th Edition - Clinical: Stage 0 (cTis (DCIS), cN0, cM0, ER+, PR+, HER2: Not Assessed) - Signed by Crawford Morna Pickle, NP on 11/29/2021 Stage prefix: Initial diagnosis Nuclear grade: G2   06/16/2021 Genetic Testing   Negative hereditary cancer genetic testing: no pathogenic variants detected in [facility] BRCAPlus Panel or CancerNext-Expanded +RNAinsight Panel.  Report dates are 06/16/2021 and 06/18/2021.   The BRCAplus panel offered by [employer] [facility] and includes sequencing and deletion/duplication analysis for the following 8 genes: ATM, BRCA1, BRCA2, CDH1, CHEK2, PALB2, PTEN, and TP53.  The CancerNext-Expanded gene panel offered by [facility] and includes sequencing, rearrangement, and RNA analysis for the following 77 genes: AIP, ALK, APC, ATM, AXIN2, BAP1, BARD1, BLM, BMPR1A, BRCA1, BRCA2, BRIP1, CDC73, CDH1, CDK4, CDKN1B, CDKN2A, CHEK2, CTNNA1, DICER1, FANCC, FH, FLCN, GALNT12, KIF1B, LZTR1, MAX, MEN1, MET, MLH1, MSH2, MSH3, MSH6, MUTYH, NBN, NF1, NF2, NTHL1, PALB2, PHOX2B, PMS2, POT1, PRKAR1A, PTCH1, PTEN, RAD51C, RAD51D, RB1, RECQL,  RET, SDHA, SDHAF2, SDHB, SDHC, SDHD, SMAD4, SMARCA4, SMARCB1, SMARCE1, STK11, SUFU, TMEM127, TP53, TSC1, TSC2, VHL and XRCC2 (sequencing and deletion/duplication); EGFR, EGLN1, HOXB13, KIT, MITF, PDGFRA, POLD1, and POLE (sequencing only); EPCAM and GREM1 (deletion/duplication only).    06/21/2021 Surgery   Left lumpectomy: Intermediate grade DCIS 0.9 cm, margins negative, ER 100%, PR 100%   07/31/2021 - 08/27/2021 Radiation Therapy   Site Technique Total Dose (Gy) Dose per Fx (Gy) Completed Fx Beam Energies  Breast, Left: Breast_L 3D 42.56/42.56 2.66 16/16 6XFFF  Breast, Left: Breast_L_Bst 3D 10/10 2.5 4/4 6X, 10X     09/2021 -  Anti-estrogen oral therapy   Tamoxifen  x 5 years     CHIEF COMPLIANT: Follow-up on tamoxifen  therapy and iron deficiency, recent hospitalization  HISTORY OF PRESENT ILLNESS:   History of Present Illness Sarah Flores is a 79 year old female with a history of ductal carcinoma in situ of the left breast, status post lumpectomy, adjuvant radiation, and ongoing tamoxifen  therapy, who presents for management of severe iron deficiency anemia.  Since December 8, she has had multiple hospitalizations for pneumonia with a small left pleural effusion, followed by severe weakness, fatigue, vomiting, and syncope that led to major functional decline and need for assistance with mobility. She feels her current debility is worse than during her prior myocardial infarction.  She developed marked iron deficiency anemia with iron reported as undetectable on January 8. She received one IV iron infusion in the hospital but believes more doses were indicated. She remains markedly fatigued and low in energy. She denies overt bleeding or gastrointestinal symptoms. Evaluation for gastrointestinal blood loss, including colon cancer, has been negative. Labs from January 18 showed persistent hypomagnesemia and hyponatremia. More recent  labs were drawn January 21 and are pending.  She has  ongoing hypomagnesemia, recently improved from as low as the 1s to 2.0, managed with home infusions every other day. She has hyponatremia treated with sodium supplementation and has had hypocalcemia with levels as low as 5.0. Nephrology follows these electrolyte abnormalities. Her left arm has been used heavily for venous access and is uncomfortable, but no lymph nodes were removed with prior breast surgery and the arm remains available for access.  She strongly wants improvement in symptoms and quality of life.      ALLERGIES:  is allergic to keflex  [cephalexin ], penicillins, amlodipine, and benzoin.  MEDICATIONS:  Current Outpatient Medications  Medication Sig Dispense Refill   ALPRAZolam  (XANAX ) 0.5 MG tablet Take one by mouth prn anxiety.  Refills after prescription expiration require follow-up evaluation at the office.     atorvastatin  (LIPITOR) 10 MG tablet Take 1 tablet by mouth at bedtime.     cyanocobalamin (VITAMIN B12) 1000 MCG/ML injection Inject into the muscle.     doxazosin (CARDURA) 1 MG tablet Take 1 mg by mouth at bedtime.     famotidine  (PEPCID ) 20 MG tablet Take 1 tablet (20 mg total) by mouth daily. 90 tablet 0   folic acid (FOLVITE) 1 MG tablet Take 1 tablet by mouth daily.     glipiZIDE  (GLUCOTROL  XL) 10 MG 24 hr tablet Take 10 mg by mouth daily.     ipratropium (ATROVENT ) 0.06 % nasal spray Place 1-2 sprays into both nostrils every 6 (six) hours as needed for rhinitis.     leflunomide  (ARAVA ) 10 MG tablet 10 mg.     leflunomide  (ARAVA ) 20 MG tablet Take 1 tablet (20 mg total) by mouth daily.     losartan -hydrochlorothiazide  (HYZAAR) 100-25 MG tablet Take 1 tablet by mouth daily.     magnesium  oxide (MAG-OX) 400 MG tablet Take 800 mg by mouth.     metFORMIN  (GLUCOPHAGE -XR) 500 MG 24 hr tablet Take 1,000 mg by mouth 2 (two) times a day.     methotrexate (RHEUMATREX) 2.5 MG tablet Take by mouth.     metoprolol  succinate (TOPROL -XL) 100 MG 24 hr tablet TAKE 1 TABLET  DAILY (TAKE WITH OR IMMEDIATELY FOLLOWING A MEAL) 90 tablet 2   metoprolol  succinate (TOPROL -XL) 100 MG 24 hr tablet Take 100 mg by mouth daily.     montelukast  (SINGULAIR ) 10 MG tablet Take one tablet by mouth once daily as directed. 90 tablet 0   nitroGLYCERIN  (NITROSTAT ) 0.4 MG SL tablet Place 1 tablet (0.4 mg total) under the tongue every 5 (five) minutes as needed for chest pain. 25 tablet 6   ondansetron  (ZOFRAN -ODT) 4 MG disintegrating tablet Take 4 mg by mouth every 8 (eight) hours as needed.     Semaglutide,0.25 or 0.5MG /DOS, (OZEMPIC, 0.25 OR 0.5 MG/DOSE,) 2 MG/3ML SOPN Inject 0.25mg  once a week x 4 weeks, if tolerating okay advance to 0.5mg  once a week     sodium chloride  1 g tablet Take 1 g by mouth.     tamoxifen  (NOLVADEX ) 10 MG tablet Take 1 tablet (10 mg total) by mouth daily. 90 tablet 3   Vitamin D , Ergocalciferol , (DRISDOL ) 1.25 MG (50000 UNIT) CAPS capsule 1 capsule.     Magnesium  200 MG TABS 2 tablets with a meal Orally Once a day     No current facility-administered medications for this visit.    PHYSICAL EXAMINATION: ECOG PERFORMANCE STATUS: 1 - Symptomatic but completely ambulatory  Vitals:   03/25/24  1126  BP: (!) 109/59  Pulse: 94  Resp: 12  Temp: 98.1 F (36.7 C)  SpO2: 100%   Filed Weights   03/25/24 1126  Weight: 112 lb 9.6 oz (51.1 kg)      LABORATORY DATA:  I have reviewed the data as listed    Latest Ref Rng & Units 07/16/2022   12:57 PM 06/06/2021   12:14 PM 03/14/2020   12:44 PM  CMP  Glucose 70 - 99 mg/dL 842  880  799   BUN 8 - 23 mg/dL 15  9  7    Creatinine 0.44 - 1.00 mg/dL 8.92  9.16  9.18   Sodium 135 - 145 mmol/L 133  138  136   Potassium 3.5 - 5.1 mmol/L 4.2  3.8  4.0   Chloride 98 - 111 mmol/L 101  99  98   CO2 22 - 32 mmol/L 20  28  25    Calcium  8.9 - 10.3 mg/dL 7.9  7.4  7.6   Total Protein 6.5 - 8.1 g/dL  7.2    Total Bilirubin 0.3 - 1.2 mg/dL  1.6    Alkaline Phos 38 - 126 U/L  49    AST 15 - 41 U/L  20    ALT 0 - 44 U/L   22      Lab Results  Component Value Date   WBC 11.1 (H) 06/06/2021   HGB 12.9 06/06/2021   HCT 37.5 06/06/2021   MCV 95.9 06/06/2021   PLT 301 06/06/2021   NEUTROABS 5.8 06/06/2021    ASSESSMENT & PLAN:  Ductal carcinoma in situ (DCIS) of left breast 05/21/2021 :Screening mammogram detected left breast mass 0.7 cm by ultrasound, axilla negative, biopsy revealed low-grade to intermediate grade DCIS ER 100%, PR 100%    06/21/2021:Left lumpectomy: Intermediate grade DCIS 0.9 cm, margins negative, ER 100%, PR 100%    Treatment plan: 1.  Adjuvant radiation therapy 08/01/21-08/27/21 2. antiestrogen therapy with tamoxifen  daily x5 years (based upon TAM-01 clinical trial we are using lower dosage of tamoxifen  at 10 mg daily.  We can go down to 5 mg if needed.)   Tamoxifen  toxicities: Tolerating it extremely well without any problems or concerns.   Diabetes mellitus: Much improved on Ozempic. Shoulder injury May 2024: Status post surgery.  Result of falls.     Breast cancer surveillance: Breast exam 06/12/2023: Benign Mammograms 06/24/2022 [facility]: Benign, density category A  Normocytic anemia Lab review: 06/27/2023: Hemoglobin 12.6, MCV 97.1, ferritin 82 03/05/2024: Hemoglobin 11.7 03/17/2024: Hemoglobin 9.8, MCV 92.5, WBC 4.7, platelets 364 (patient was in the hospital with left lower lobe pneumonia)  Discussion: Labs indicate profound iron deficiency.  The ferritin is elevated because it is an acute phase reactant.  I believe that the iron deficiency is also complicated by anemia of infection.  Recommended administering 2 doses of IV iron  Recheck labs in 1 month and telephone visit after that to discuss results.    Orders Placed This Encounter  Procedures   CBC with Differential (Cancer Center Only)    Standing Status:   Future    Expiration Date:   03/25/2025   Ferritin    Standing Status:   Future    Expiration Date:   03/25/2025   Iron and Iron Binding Capacity ([facility] only)     Standing Status:   Future    Expiration Date:   03/25/2025   CMP (Cancer Center only)    Standing Status:   Future  Expiration Date:   03/25/2025   The patient has a good understanding of the overall plan. she agrees with it. she will call with any problems that may develop before the next visit here.  I personally spent a total of 30 minutes in the care of the patient today including preparing to see the patient, getting/reviewing separately obtained history, performing a medically appropriate exam/evaluation, counseling and educating, placing orders, referring and communicating with other health care professionals, documenting clinical information in the EHR, independently interpreting results, communicating results, and coordinating care.   Viinay K Estalee Mccandlish, MD 03/25/24    "

## 2024-03-25 NOTE — Assessment & Plan Note (Signed)
 05/21/2021 :Screening mammogram detected left breast mass 0.7 cm by ultrasound, axilla negative, biopsy revealed low-grade to intermediate grade DCIS ER 100%, PR 100%    06/21/2021:Left lumpectomy: Intermediate grade DCIS 0.9 cm, margins negative, ER 100%, PR 100%    Treatment plan: 1.  Adjuvant radiation therapy 08/01/21-08/27/21 2. antiestrogen therapy with tamoxifen  daily x5 years (based upon TAM-01 clinical trial we are using lower dosage of tamoxifen  at 10 mg daily.  We can go down to 5 mg if needed.)   Tamoxifen  toxicities: Tolerating it extremely well without any problems or concerns.   Diabetes mellitus: Much improved on Ozempic. Shoulder injury May 2024: Status post surgery.  Result of falls.     Breast cancer surveillance: Breast exam 06/12/2023: Benign Mammograms 06/24/2022 [facility]: Benign, density category A

## 2024-03-25 NOTE — Telephone Encounter (Signed)
 Dr.Gudena, patient will be scheduled as soon as possible.  Auth Submission: NO AUTH NEEDED Site of care: Site of care: [facility] Payer: Medicare A/B with [employer] Medication & CPT/J Code(s) submitted: Feraheme (ferumoxytol) [id number] Diagnosis Code:  Route of submission (phone, fax, portal):  Phone # Fax # Auth type: Buy/Bill PB Units/visits requested: 510mg  x 2 doses Reference number:  Approval from: 03/25/24 to 07/01/24

## 2024-03-26 ENCOUNTER — Encounter: Attending: Hematology and Oncology | Admitting: *Deleted

## 2024-03-26 VITALS — BP 134/85 | HR 85 | Temp 97.6°F | Resp 20

## 2024-03-26 DIAGNOSIS — D509 Iron deficiency anemia, unspecified: Secondary | ICD-10-CM | POA: Diagnosis present

## 2024-03-26 MED ORDER — SODIUM CHLORIDE 0.9 % IV SOLN
510.0000 mg | Freq: Once | INTRAVENOUS | Status: AC
  Administered 2024-03-26: 510 mg via INTRAVENOUS
  Filled 2024-03-26: qty 17

## 2024-03-26 NOTE — Progress Notes (Signed)
 Diagnosis: Iron Deficiency Anemia  Provider:  Sherre Clapper MD  Procedure: IV Infusion  IV Type: Peripheral, IV Location: R Forearm  Cathflo (Altepase), Feraheme (Ferumoxytol ), Dose: 510 mg  Infusion Start Time: 1108  Infusion Stop Time: 1123  Post Infusion IV Care: Observation period completed and IV remained in place upon d/c  Discharge: Condition: Good, Destination: Home . AVS Provided  Performed by:  Naidelyn Parrella E, RN   IV placed prior to patient's appointment in infusion clinic. IV flushed with 10cc NS, locked, and covered with coban.

## 2024-04-02 ENCOUNTER — Encounter

## 2024-04-02 VITALS — BP 144/84 | HR 68 | Temp 97.7°F | Resp 18

## 2024-04-02 DIAGNOSIS — D509 Iron deficiency anemia, unspecified: Secondary | ICD-10-CM

## 2024-04-02 MED ORDER — SODIUM CHLORIDE 0.9 % IV SOLN
510.0000 mg | Freq: Once | INTRAVENOUS | Status: AC
  Administered 2024-04-02: 510 mg via INTRAVENOUS

## 2024-04-02 NOTE — Progress Notes (Signed)
 Diagnosis: Iron Deficiency Anemia  Provider:  Sherre Clapper MD  Procedure: IV Infusion  IV Type: Peripheral, IV Location: L Forearm  Feraheme (Ferumoxytol ), Dose: 510 mg  Infusion Start Time: 1147  Infusion Stop Time: 1205  Post Infusion IV Care: Peripheral IV Discontinued  Discharge: Condition: Good, Destination: Home . AVS Declined  Performed by:  Waddell LOISE Freshwater, RN

## 2024-04-22 ENCOUNTER — Inpatient Hospital Stay

## 2024-04-26 ENCOUNTER — Inpatient Hospital Stay: Admitting: Adult Health

## 2024-06-14 ENCOUNTER — Ambulatory Visit: Admitting: Hematology and Oncology

## 2024-11-04 ENCOUNTER — Ambulatory Visit: Admitting: Allergy and Immunology
